# Patient Record
Sex: Male | Born: 2000
Health system: Southern US, Community
[De-identification: ages and names within clinical notes are randomized; demographics above are authoritative.]

## PROBLEM LIST (undated history)

## (undated) DIAGNOSIS — F419 Anxiety disorder, unspecified: Secondary | ICD-10-CM

## (undated) DIAGNOSIS — F32A Depression, unspecified: Secondary | ICD-10-CM

## (undated) DIAGNOSIS — F200 Paranoid schizophrenia: Secondary | ICD-10-CM

---
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
## 2018-07-11 ENCOUNTER — Ambulatory Visit (HOSPITAL_COMMUNITY)
Admission: AD | Admit: 2018-07-11 | Discharge: 2018-07-11 | Disposition: A | Discharge status: Home / Self Care | Payer: Self-pay | Attending: Psychiatry | Admitting: Psychiatry

## 2018-07-11 ENCOUNTER — Other Ambulatory Visit: Payer: Self-pay

## 2018-07-11 ENCOUNTER — Emergency Department (HOSPITAL_COMMUNITY): Payer: HRSA Program

## 2018-07-11 ENCOUNTER — Emergency Department (HOSPITAL_COMMUNITY)
Admission: EM | Admit: 2018-07-11 | Discharge: 2018-07-13 | Disposition: A | Discharge status: Other Acute Inpatient Hospital | Payer: HRSA Program | Attending: Registered Nurse | Admitting: Registered Nurse

## 2018-07-11 DIAGNOSIS — R05 Cough: Secondary | ICD-10-CM | POA: Diagnosis not present

## 2018-07-11 DIAGNOSIS — R4689 Other symptoms and signs involving appearance and behavior: Secondary | ICD-10-CM | POA: Diagnosis present

## 2018-07-11 DIAGNOSIS — Z046 Encounter for general psychiatric examination, requested by authority: Secondary | ICD-10-CM | POA: Diagnosis not present

## 2018-07-11 DIAGNOSIS — F22 Delusional disorders: Secondary | ICD-10-CM | POA: Insufficient documentation

## 2018-07-11 DIAGNOSIS — Z20828 Contact with and (suspected) exposure to other viral communicable diseases: Secondary | ICD-10-CM | POA: Insufficient documentation

## 2018-07-11 DIAGNOSIS — F69 Unspecified disorder of adult personality and behavior: Secondary | ICD-10-CM

## 2018-07-11 DIAGNOSIS — F17210 Nicotine dependence, cigarettes, uncomplicated: Secondary | ICD-10-CM | POA: Diagnosis not present

## 2018-07-11 DIAGNOSIS — Z1159 Encounter for screening for other viral diseases: Secondary | ICD-10-CM | POA: Diagnosis not present

## 2018-07-11 DIAGNOSIS — R509 Fever, unspecified: Secondary | ICD-10-CM | POA: Diagnosis not present

## 2018-07-11 LAB — CBC WITH DIFFERENTIAL/PLATELET
Abs Immature Granulocytes: 0.02 10*3/uL (ref 0.00–0.07)
Basophils Absolute: 0 10*3/uL (ref 0.0–0.1)
Basophils Relative: 0 %
Eosinophils Absolute: 0 10*3/uL (ref 0.0–0.5)
Eosinophils Relative: 0 %
HCT: 41.8 % (ref 39.0–52.0)
Hemoglobin: 14.1 g/dL (ref 13.0–17.0)
Immature Granulocytes: 0 %
Lymphocytes Relative: 27 %
Lymphs Abs: 2.1 10*3/uL (ref 0.7–4.0)
MCH: 29.1 pg (ref 26.0–34.0)
MCHC: 33.7 g/dL (ref 30.0–36.0)
MCV: 86.4 fL (ref 80.0–100.0)
Monocytes Absolute: 0.6 10*3/uL (ref 0.1–1.0)
Monocytes Relative: 7 %
Neutro Abs: 5.2 10*3/uL (ref 1.7–7.7)
Neutrophils Relative %: 66 %
Platelets: 157 10*3/uL (ref 150–400)
RBC: 4.84 MIL/uL (ref 4.22–5.81)
RDW: 13.2 % (ref 11.5–15.5)
WBC: 8 10*3/uL (ref 4.0–10.5)
nRBC: 0 % (ref 0.0–0.2)

## 2018-07-11 LAB — SARS CORONAVIRUS 2 BY RT PCR (HOSPITAL ORDER, PERFORMED IN ~~LOC~~ HOSPITAL LAB): SARS Coronavirus 2: NEGATIVE

## 2018-07-11 LAB — BASIC METABOLIC PANEL
Anion gap: 14 (ref 5–15)
BUN: 13 mg/dL (ref 6–20)
CO2: 24 mmol/L (ref 22–32)
Calcium: 9.8 mg/dL (ref 8.9–10.3)
Chloride: 101 mmol/L (ref 98–111)
Creatinine, Ser: 1.19 mg/dL (ref 0.61–1.24)
GFR calc Af Amer: >60 mL/min (ref >60)
GFR calc non Af Amer: >60 mL/min (ref >60)
Glucose, Bld: 99 mg/dL (ref 70–99)
Potassium: 3.6 mmol/L (ref 3.5–5.1)
Sodium: 139 mmol/L (ref 135–145)

## 2018-07-11 MED ORDER — ACETAMINOPHEN 325 MG PO TABS: 650.0000 mg | ORAL_TABLET | ORAL | Status: DC | PRN
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
## 2018-07-11 NOTE — ED Triage Notes (Signed)
Pt IVC and was at [facility] but is transferred here for evaluation of cough and fever. Pt tried to run while transfer was in process.

## 2018-07-11 NOTE — BHH Counselor (Signed)
Per Shatavier, NT pt is asleep, unable to rouse. Clinician asked Norm Salt, NT to call [phone]) once pt is alert and able to engage in assessment.    Redmond Pulling, MS, [facility], [facility] Triage Specialist [phone]

## 2018-07-11 NOTE — ED Provider Notes (Signed)
[facility] EMERGENCY DEPARTMENT Provider Note   CSN: [id number] Arrival date & time: 07/11/18  1711    History   Chief Complaint Chief Complaint  Patient presents with  . Cough    HPI Jeremy Taylor is a 18 y.o. male.     HPI Patient IVC at behavioral health but was transferred here after trying to run away and police had to catch him.  Behavioral health center said the patient possibly had a cough or fever but this is not documented at this time.  Patient says that he feels well and has no complaints at this time.  Police officers say they have no further history to add to the patient. No past medical history on file.  There are no active problems to display for this patient.   No past surgical history on file.      Home Medications    Prior to Admission medications   Not on File    Family History No family history on file.  Social History Social History   Tobacco Use  . Smoking status: Current Every Day Smoker    Packs/day: 1.00    Types: Cigarettes  . Smokeless tobacco: Never Used  Substance Use Topics  . Alcohol use: No  . Drug use: No     Allergies   Patient has no known allergies.   Review of Systems Review of Systems  Constitutional: Negative for activity change.  HENT: Negative for congestion.   Respiratory: Negative for chest tightness and shortness of breath.   Cardiovascular: Negative for chest pain.  Gastrointestinal: Negative for abdominal pain.  Musculoskeletal: Negative for back pain and neck pain.  Skin: Negative for rash.  Allergic/Immunologic: Negative for immunocompromised state.  Hematological: Does not bruise/bleed easily.  Psychiatric/Behavioral: Positive for behavioral problems.     Physical Exam Updated Vital Signs BP (!) 138/93 (BP Location: Right Arm)   Pulse 95   Temp 98.8 F (37.1 C) (Oral)   Resp 12   SpO2 94%   Physical Exam Vitals signs and nursing note reviewed.  Constitutional:       Appearance: He is well-developed. He is not ill-appearing.  HENT:     Head: Normocephalic and atraumatic.     Nose: No congestion or rhinorrhea.     Mouth/Throat:     Pharynx: No oropharyngeal exudate or posterior oropharyngeal erythema.  Eyes:     Conjunctiva/sclera: Conjunctivae normal.  Neck:     Musculoskeletal: Neck supple.  Cardiovascular:     Rate and Rhythm: Normal rate and regular rhythm.     Heart sounds: No murmur.  Pulmonary:     Effort: Pulmonary effort is normal. No respiratory distress.     Breath sounds: Normal breath sounds.  Abdominal:     Palpations: Abdomen is soft.     Tenderness: There is no abdominal tenderness.  Skin:    General: Skin is warm and dry.  Neurological:     General: No focal deficit present.     Mental Status: He is alert. Mental status is at baseline.     Cranial Nerves: No cranial nerve deficit.     Sensory: No sensory deficit.     Motor: No weakness.      ED Treatments / Results  Labs (all labs ordered are listed, but only abnormal results are displayed) Labs Reviewed  SARS CORONAVIRUS 2 (HOSPITAL ORDER, PERFORMED IN [facility])  CBC WITH DIFFERENTIAL/PLATELET  BASIC METABOLIC PANEL    EKG  None  Radiology Dg Chest Portable 1 View  Result Date: 07/11/2018 CLINICAL DATA:  Cough and fever EXAM: PORTABLE CHEST 1 VIEW COMPARISON:  Portable exam 7055 hours without priors for comparison FINDINGS: Normal heart size, mediastinal contours, and pulmonary vascularity. Lungs clear. No infiltrate, pleural effusion or pneumothorax. Bones unremarkable. IMPRESSION: No acute abnormalities. Electronically Signed   By: Ulyses SouthwardMark  Boles M.D.   On: 07/11/2018 18:11    Procedures Procedures (including critical care time)  Medications Ordered in ED Medications - No data to display   Initial Impression / Assessment and Plan / ED Course  I have reviewed the triage vital signs and the nursing notes.  Pertinent labs & imaging results  that were available during my care of the patient were reviewed by me and considered in my medical decision making (see chart for details).         Ill-appearing hemodynamically stable 18 year old man presents in police custody with IVC paperwork for behavioral outburst and running away.  Concern from facility that the patient may have a URI.  COVID testing underway and is negative.  Basic lab work unremarkable.  Patient otherwise appears well.  I do not believe we need further invasive testing at this time he is stable to go back to his facility.  However, I spoke to the facility and they stated that he never actually had an inpatient bed ready.  He needs a TTS evaluation.  IVC paperwork completed for the patient.  Taken to the behavioral holding unit. Final Clinical Impressions(s) / ED Diagnoses   Final diagnoses:  Behavior problem, adult  Behavioral change    ED Discharge Orders    None       Ina KickWestphal, Verlena Marlette, MD 07/11/18 2027    Gerhard MunchLockwood, Robert, MD 07/13/18 [phone]

## 2018-07-12 MED ORDER — LORAZEPAM 1 MG PO TABS
1.0000 mg | ORAL_TABLET | Freq: Four times a day (QID) | ORAL | Status: DC | PRN
  Administered 2018-07-12: 1 mg via ORAL
  Filled 2018-07-12: qty 1

## 2018-07-12 NOTE — ED Notes (Signed)
Asleep since receiving Ativan to help him relax and sleep.

## 2018-07-12 NOTE — ED Notes (Signed)
Offered him Ativan for tonight to help him relax and sleep, he is currently restless in bed. Slow to respond to questions, head covered up when initially approached. Seems like he is not understanding requests or has auditory interference due to the slowness of his responses, minimally verbal. Once he took meds covered his head back up and lying in bed quietly. Will continue to monitor for safety.

## 2018-07-12 NOTE — Progress Notes (Signed)
Pt. meets criteria for inpatient treatment per Denzil Magnuson, , NP.  No appropriate beds available at [facility]. Referred out to the following hospitals:  [facility]       [facility]     [facility]     [facility]     [facility]     [facility]     [facility]     [facility]     [facility]     [facility]     Disposition CSW will continue to follow for placement.  Timmothy Euler. Kaylyn Lim, MSW, LCSW Disposition Clinical Social Work [phone] (cell) [phone] (office)

## 2018-07-12 NOTE — B[facility] Counselor (Signed)
  [facility]  ASSESSMENT  DISPOSITION  NOTE  Disposition: [facility] discussed case with [facility] provider, Denzil Magnuson, NP who recommends inpatient treatment.  TTS will look for inpatient placement  Sammy Cassar L. Jaspal Pultz, MS, [facility], [facility] Therapeutic Triage Specialist  [phone]

## 2018-07-12 NOTE — ED Notes (Signed)
Pt continues to be in bed. Pt came out to call girlfriend, pt did not call.

## 2018-07-12 NOTE — ED Notes (Signed)
[facility] contacted about pt's breakfast tray. 

## 2018-07-12 NOTE — ED Notes (Signed)
Tech tried to get his vitals he put both of his arms in his shirt to make them unavailable for vitals and when she pressed his slightly again he said he had AIDS. Staff did not press further on vitals at this time.
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
## 2018-07-12 NOTE — ED Notes (Signed)
Pt ran off unit during report. Security notified. [employer] and security brought back to unit. RN explain IVC and had to be on unit for his safety. TTS assessed pt. Pt currently in bed.

## 2018-07-12 NOTE — ED Notes (Signed)
Lunch has been ordered  

## 2018-07-13 ENCOUNTER — Encounter (HOSPITAL_COMMUNITY): Payer: Self-pay

## 2018-07-13 ENCOUNTER — Other Ambulatory Visit: Payer: Self-pay

## 2018-07-13 ENCOUNTER — Inpatient Hospital Stay (HOSPITAL_COMMUNITY)
Admission: AD | Admit: 2018-07-13 | Discharge: 2018-07-19 | DRG: 885 | Disposition: A | Discharge status: Home / Self Care | Payer: No Typology Code available for payment source | Source: Intra-hospital | Attending: Psychiatry | Admitting: Psychiatry

## 2018-07-13 DIAGNOSIS — G4700 Insomnia, unspecified: Secondary | ICD-10-CM | POA: Diagnosis present

## 2018-07-13 DIAGNOSIS — F2081 Schizophreniform disorder: Secondary | ICD-10-CM | POA: Diagnosis present

## 2018-07-13 DIAGNOSIS — F200 Paranoid schizophrenia: Principal | ICD-10-CM

## 2018-07-13 DIAGNOSIS — F17210 Nicotine dependence, cigarettes, uncomplicated: Secondary | ICD-10-CM | POA: Diagnosis present

## 2018-07-13 DIAGNOSIS — F419 Anxiety disorder, unspecified: Secondary | ICD-10-CM | POA: Diagnosis present

## 2018-07-13 DIAGNOSIS — F1220 Cannabis dependence, uncomplicated: Secondary | ICD-10-CM | POA: Diagnosis present

## 2018-07-13 DIAGNOSIS — I10 Essential (primary) hypertension: Secondary | ICD-10-CM | POA: Diagnosis present

## 2018-07-13 DIAGNOSIS — F22 Delusional disorders: Secondary | ICD-10-CM | POA: Diagnosis present

## 2018-07-13 MED ORDER — NICOTINE POLACRILEX 2 MG MT GUM
2.0000 mg | CHEWING_GUM | OROMUCOSAL | Status: DC | PRN
  Administered 2018-07-15: 16:00:00 2 mg via ORAL
  Filled 2018-07-13: qty 1

## 2018-07-13 NOTE — Tx Team (Signed)
Initial Treatment Plan 07/13/2018 11:33 PM [name] [name] DUK:[id number]    PATIENT STRESSORS: Financial difficulties Marital or family conflict Medication change or noncompliance Occupational concerns Substance abuse   PATIENT STRENGTHS: Physical Health Supportive family/friends   PATIENT IDENTIFIED PROBLEMS: Anxiety  Substance abuse  "Be on medications."  "Place to go."               DISCHARGE CRITERIA:  Ability to meet basic life and health needs Adequate post-discharge living arrangements Improved stabilization in mood, thinking, and/or behavior Medical problems require only outpatient monitoring Motivation to continue treatment in a less acute level of care  PRELIMINARY DISCHARGE PLAN: Attend aftercare/continuing care group Attend PHP/IOP Outpatient therapy Placement in alternative living arrangements  PATIENT/FAMILY INVOLVEMENT: This treatment plan has been presented to and reviewed with the patient, Jeremy Taylor, and/or family member.  The patient and family have been given the opportunity to ask questions and make suggestions.  Bethann Punches, RN 07/13/2018, 11:33 PM

## 2018-07-13 NOTE — ED Notes (Signed)
Breakfast tray ordered 

## 2018-07-13 NOTE — ED Notes (Signed)
Pt told sitter he had to use the bathroom, sitter walked to the bathroom with him but pt took off running towards the exit. Pt went through back bowling alley hallway. [employer] left purple and [employer]pd from front got him and brought him back.

## 2018-07-13 NOTE — ED Notes (Signed)
Dinner tray at beside

## 2018-07-13 NOTE — BHH Counselor (Signed)
TTS reassessment: Patient is alert and oriented x 3. He states he does not remember why he came into the hospital. Patient denies SI/AVH but admits to passive homicidal thoughts not directed at a particular person. This clinician utilized chart review to explain to patient how he arrived to the ED. Patient does remember making suicidal statements and admitted to hearing voices telling him to hurt others and animals. Patient is pleasant and cooperative during assessment, however appears to be responding to internal stimuli. Patient continues to meet in patient criteria.

## 2018-07-13 NOTE — ED Notes (Signed)
Dinner tray ordered.

## 2018-07-13 NOTE — ED Notes (Signed)
Carney Bern from [facility] contacted this RN to advised that patient has a bed at [facility] (505-1) and will be accepted by Denzil Magnuson, NP/attending Malvin Johns, MD and patient may come over at 2000 this evening.

## 2018-07-13 NOTE — ED Notes (Signed)
Patient updated on plan of care and made aware that [facility] is working on getting him placed a facility. Pt verbalized understanding.

## 2018-07-13 NOTE — Progress Notes (Signed)
Jeremy Taylor is a 18 y.o. male involuntary admitted for homicidal and suicidal ideations.  Pt alert and cooperative during admission process. Pt answered all the question without any problems, but was not able to remember how he got to the hospital or what problem that brought him to the hospital. Pt stated his goal is to put on medications. Denied HI/SI,AVH at this time. Basic search of patient completed with skin check completed.  Belongings reviewed and noted on belongings record.  Oriented to unit and rules, consents and treatment agreement reviewed with patient and signed. Will continue to monitor.

## 2018-07-13 NOTE — ED Notes (Signed)
Lunch tray here 

## 2018-07-13 NOTE — ED Notes (Signed)
Pt has left [facility] with [employer] to go to [facility]

## 2018-07-13 NOTE — ED Notes (Signed)
Lunch tray ordered 

## 2018-07-13 NOTE — Progress Notes (Signed)
Pt accepted to [facility]  Denzil Magnuson, NP is the accepting provider.  Malvin Johns, MD is the attending provider.  Call report to [phone]  [facility] ED notified.   Pt is IVC   Pt may be transported by [employer] Pt scheduled  to arrive at [facility] @ 20:00   Carney Bern T. Kaylyn Lim, MSW, LCSW Disposition Clinical Social Work [phone] (cell) [phone] (office)

## 2018-07-13 NOTE — ED Notes (Signed)
Patient's mother (emergency contact) called stating she was concerned that she had not been able to see her son or speak with him and did not know what was going on. This RN updated pts mother on plan of care and asked patient if he would like to speak with her for a minute while she was on the phone. Patient calmly talking to mother at this time.

## 2018-07-13 NOTE — ED Notes (Signed)
Pt informed that pt has a bed at [facility] and that transport will occur soon pt was given the opportunity to call someone to inform them of his transfer and pt refused.

## 2018-07-13 NOTE — ED Notes (Signed)
Patient's mother also wanted to leave patient's father (gregory Ocallaghan's) contact number in case she cannot be reached: [phone]

## 2018-07-13 NOTE — ED Notes (Signed)
TTS machine set up at bedside per [facility] request

## 2018-07-13 NOTE — ED Notes (Signed)
Breakfast tray here.

## 2018-07-13 NOTE — ED Notes (Signed)
Pt given snack and water

## 2018-07-14 DIAGNOSIS — F200 Paranoid schizophrenia: Principal | ICD-10-CM

## 2018-07-14 MED ORDER — QUETIAPINE FUMARATE 100 MG PO TABS
100.0000 mg | ORAL_TABLET | Freq: Every day | ORAL | Status: DC
  Administered 2018-07-14: 02:00:00 100 mg via ORAL
  Filled 2018-07-14 (×4): qty 1

## 2018-07-14 MED ORDER — ADULT MULTIVITAMIN W/MINERALS CH
1.0000 | ORAL_TABLET | Freq: Every day | ORAL | Status: DC
  Administered 2018-07-14 – 2018-07-19 (×6): 1 via ORAL
  Filled 2018-07-14 (×2): qty 1
  Filled 2018-07-14: qty 7
  Filled 2018-07-14 (×6): qty 1

## 2018-07-14 MED ORDER — HYDROXYZINE HCL 50 MG PO TABS
50.0000 mg | ORAL_TABLET | Freq: Four times a day (QID) | ORAL | Status: DC | PRN
  Administered 2018-07-14: 13:00:00 50 mg via ORAL
  Filled 2018-07-14 (×2): qty 1

## 2018-07-14 MED ORDER — TEMAZEPAM 30 MG PO CAPS
30.0000 mg | ORAL_CAPSULE | Freq: Every day | ORAL | Status: DC
  Administered 2018-07-15 – 2018-07-18 (×4): 30 mg via ORAL
  Filled 2018-07-14 (×2): qty 1

## 2018-07-14 MED ORDER — LORAZEPAM 1 MG PO TABS
1.0000 mg | ORAL_TABLET | ORAL | Status: AC | PRN
  Administered 2018-07-14: 1 mg via ORAL
  Filled 2018-07-14: qty 1

## 2018-07-14 MED ORDER — ENSURE ENLIVE PO LIQD
237.0000 mL | ORAL | Status: DC
  Administered 2018-07-15 – 2018-07-17 (×2): 237 mL via ORAL

## 2018-07-14 MED ORDER — ZIPRASIDONE MESYLATE 20 MG IM SOLR: 20.0000 mg | INTRAMUSCULAR | Status: DC | PRN

## 2018-07-14 MED ORDER — LORAZEPAM 1 MG PO TABS: 2.0000 mg | ORAL_TABLET | ORAL | Status: DC | PRN

## 2018-07-14 MED ORDER — OLANZAPINE 10 MG PO TBDP
10.0000 mg | ORAL_TABLET | Freq: Three times a day (TID) | ORAL | Status: DC | PRN
  Administered 2018-07-14: 13:00:00 10 mg via ORAL
  Filled 2018-07-14: qty 1

## 2018-07-14 MED ORDER — RISPERIDONE 3 MG PO TABS
3.0000 mg | ORAL_TABLET | Freq: Two times a day (BID) | ORAL | Status: DC
  Administered 2018-07-14 – 2018-07-15 (×2): 3 mg via ORAL
  Filled 2018-07-14 (×4): qty 1

## 2018-07-14 MED ORDER — OMEGA-3-ACID ETHYL ESTERS 1 G PO CAPS
1.0000 g | ORAL_CAPSULE | Freq: Two times a day (BID) | ORAL | Status: DC
  Administered 2018-07-14 – 2018-07-19 (×10): 1 g via ORAL
  Filled 2018-07-14 (×2): qty 1
  Filled 2018-07-14: qty 14
  Filled 2018-07-14 (×4): qty 1
  Filled 2018-07-14: qty 14
  Filled 2018-07-14 (×6): qty 1

## 2018-07-14 MED ORDER — LORAZEPAM 2 MG/ML IJ SOLN: 2.0000 mg | INTRAMUSCULAR | Status: DC | PRN

## 2018-07-14 MED ORDER — BENZTROPINE MESYLATE 1 MG PO TABS
1.0000 mg | ORAL_TABLET | Freq: Two times a day (BID) | ORAL | Status: DC
  Administered 2018-07-14 – 2018-07-19 (×10): 1 mg via ORAL
  Filled 2018-07-14: qty 1
  Filled 2018-07-14: qty 14
  Filled 2018-07-14 (×2): qty 1
  Filled 2018-07-14: qty 14
  Filled 2018-07-14 (×9): qty 1

## 2018-07-14 NOTE — Progress Notes (Signed)
Recreation Therapy Notes  Date: 5.6.20 Time: 1000 Location: 500 Hall Dayroom   Group Topic: Communication, Team Building, Problem Solving  Goal Area(s) Addresses:  Patient will effectively work with peer towards shared goal.  Patient will identify skill used to make activity successful.  Patient will identify how skills used during activity can be used to reach post d/c goals.   Intervention: STEM Activity   Activity: [employer]. Patients were provided the following materials: 5 drinking straws, 5 rubber bands, 5 paper clips, 2 index cards, 2 drinking cups, and 2 toilet paper rolls. Using the provided materials patients were asked to build a launching mechanisms to launch a ping pong ball approximately 12 feet. Patients were divided into teams of 3-5.   Education: Pharmacist, community, Building control surveyor.   Education Outcome: Acknowledges education/In group clarification offered/Needs additional education.   Clinical Observations/Feedback: Pt did not attend group.    Caroll Rancher, LRT/CTRS         Caroll Rancher A 07/14/2018 12:07 PM

## 2018-07-14 NOTE — Progress Notes (Signed)
NUTRITION ASSESSMENT  Pt identified as at risk on the Malnutrition Screen Tool  INTERVENTION: - will order Ensure Enlive once/day, each supplement provides 350 kcal and 20 grams of protein. - will order daily multivitamin with minerals. - continue to encourage PO intakes.    NUTRITION DIAGNOSIS: Unintentional weight loss related to sub-optimal intake as evidenced by pt report.   Goal: Pt to meet >/= 90% of their estimated nutrition needs.  Monitor:  PO intake  Assessment:  Patient admitted for SI and HI. Per chart review, current weight is 130 lb and the last most recent weight was on 07/02/16 when he weighed 140 lb. This indicates 10 lb weight loss (7% body weight) in the past 2 years; not significant for time frame but unsure if weight loss has occurred more acutely.     18 y.o. male  Height: Ht Readings from Last 1 Encounters:  07/13/18 5\' 9"  (1.753 m)    Weight: Wt Readings from Last 1 Encounters:  07/13/18 59 kg    Weight Hx: Wt Readings from Last 10 Encounters:  07/13/18 59 kg  07/02/16 63.5 kg (35 %, Z= -0.39)*  03/09/15 62.8 kg (46 %, Z= -0.11)*   * Growth percentiles are based on CDC (Boys, 2-20 Years) data.    BMI:  Body mass index is 19.2 kg/m. Pt meets criteria for normal weight based on current BMI.  Estimated Nutritional Needs: Kcal: 25-30 kcal/kg Protein: > 1 gram protein/kg Fluid: 1 ml/kcal  Diet Order:  Diet Order            Diet regular Room service appropriate? Yes; Fluid consistency: Thin  Diet effective now             Pt is also offered choice of unit snacks mid-morning and mid-afternoon.  Pt is eating as desired.   Lab results and medications reviewed.     Trenton Gammon, MS, RD, LDN, [facility] Inpatient Clinical Dietitian Pager # [phone] After hours/weekend pager # [phone]
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
## 2018-07-14 NOTE — Progress Notes (Signed)
Psychoeducational Group Note  Date:  07/14/2018 Time:  2112  Group Topic/Focus:  Wrap-Up Group:   The focus of this group is to help patients review their daily goal of treatment and discuss progress on daily workbooks.  Participation Level: Did Not Attend  Participation Quality:  Not Applicable  Affect:  Not Applicable  Cognitive:  Not Applicable  Insight:  Not Applicable  Engagement in Group: Not Applicable  Additional Comments:  The patient did not attend group this evening.   Hazle Coca S 07/14/2018, 9:12 PM

## 2018-07-14 NOTE — Progress Notes (Signed)
[place] NOVEL CORONAVIRUS (COVID-19) DAILY CHECK-OFF SYMPTOMS - answer yes or no to each - every day NO YES  Have you had a fever in the past 24 hours?  . Fever (Temp > 37.80C / 100F) X   Have you had any of these symptoms in the past 24 hours? . New Cough .  Sore Throat  .  Shortness of Breath .  Difficulty Breathing .  Unexplained Body Aches   X   Have you had any one of these symptoms in the past 24 hours not related to allergies?   . Runny Nose .  Nasal Congestion .  Sneezing   X   If you have had runny nose, nasal congestion, sneezing in the past 24 hours, has it worsened?  X   EXPOSURES - check yes or no X   Have you traveled outside the state in the past 14 days?  X   Have you been in contact with someone with a confirmed diagnosis of COVID-19 or PUI in the past 14 days without wearing appropriate PPE?  X   Have you been living in the same home as a person with confirmed diagnosis of COVID-19 or a PUI (household contact)?    X   Have you been diagnosed with COVID-19?    X              What to do next: Answered NO to all: Answered YES to anything:   Proceed with unit schedule Follow the [facility] Inpatient Flowsheet.   

## 2018-07-14 NOTE — Progress Notes (Addendum)
Patient ID: Jeremy Taylor, male   DOB: [birth year], 18 y.o.   MRN: [id number]  [place] NOVEL CORONAVIRUS (COVID-19) DAILY CHECK-OFF SYMPTOMS - answer yes or no to each - every day NO YES  Have you had a fever in the past 24 hours?  . Fever (Temp > 37.80C / 100F) X   Have you had any of these symptoms in the past 24 hours? . New Cough .  Sore Throat  .  Shortness of Breath .  Difficulty Breathing .  Unexplained Body Aches   X   Have you had any one of these symptoms in the past 24 hours not related to allergies?   . Runny Nose .  Nasal Congestion .  Sneezing   X   If you have had runny nose, nasal congestion, sneezing in the past 24 hours, has it worsened?  X   EXPOSURES - check yes or no X   Have you traveled outside the state in the past 14 days?  X   Have you been in contact with someone with a confirmed diagnosis of COVID-19 or PUI in the past 14 days without wearing appropriate PPE?  X   Have you been living in the same home as a person with confirmed diagnosis of COVID-19 or a PUI (household contact)?    X   Have you been diagnosed with COVID-19?    X              What to do next: Answered NO to all: Answered YES to anything:   Proceed with unit schedule Follow the [facility] Inpatient Flowsheet.

## 2018-07-14 NOTE — Plan of Care (Signed)
  Problem: Medication: Goal: Compliance with prescribed medication regimen will improve Outcome: Progressing   D: Pt alert and oriented on the unit. Pt is isolative to his room and slept during the latter half of the day. Pt denies SI/HI, A/VH, and denies any pain. Pt did not participate during unit groups and activities.  A: Education, support and encouragement provided, q15 minute safety checks remain in effect. Medications administered per MD orders. R: No reactions/side effects to medicine noted. Pt ambulating on the unit with no issues. Pt remains safe on the unit.

## 2018-07-14 NOTE — [facility] Suicide Risk Assessment (Signed)
[facility] Admission Suicide Risk Assessment   Nursing information obtained from:  Patient Demographic factors:  Male Current Mental Status:  Suicidal ideation indicated by patient Loss Factors:  Financial problems / change in socioeconomic status Historical Factors:  Prior suicide attempts Risk Reduction Factors:  Living with another person, especially a relative  Total Time spent with patient: 45 minutes Principal Problem: Psychotic disorder Diagnosis:  Active Problems:   Delusional disorder (HCC)   Paranoid schizophrenia (HCC)  Subjective Data: mutism compromising interview but see mental status exam  Continued Clinical Symptoms:  Alcohol Use Disorder Identification Test Final Score (AUDIT): 0 The "Alcohol Use Disorders Identification Test", Guidelines for Use in Primary Care, Second Edition.  World Science writer [facility]). Score between 0-7:  no or low risk or alcohol related problems. Score between 8-15:  moderate risk of alcohol related problems. Score between 16-19:  high risk of alcohol related problems. Score 20 or above:  warrants further diagnostic evaluation for alcohol dependence and treatment.   CLINICAL FACTORS:   Schizophrenia:   Command hallucinatons   COGNITIVE FEATURES THAT CONTRIBUTE TO RISK:  Loss of executive function    SUICIDE RISK:   Minimal: No identifiable suicidal ideation.  Patients presenting with no risk factors but with morbid ruminations; may be classified as minimal risk based on the severity of the depressive symptoms  PLAN OF CARE: meds adjusted  I certify that inpatient services furnished can reasonably be expected to improve the patient's condition.   Malvin Johns, MD 07/14/2018, 1:13 PM

## 2018-07-14 NOTE — Tx Team (Addendum)
Interdisciplinary Treatment and Diagnostic Plan Update  07/15/2018 Time of Session: 0916 Jeremy Taylor MRN: [id number]  Principal Diagnosis: <principal problem not specified>  Secondary Diagnoses: Active Problems:   Delusional disorder ([place])   Paranoid schizophrenia ([place])   Current Medications:  Current Facility-Administered Medications  Medication Dose Route Frequency Provider Last Rate Last Dose  . benztropine (COGENTIN) tablet 1 mg  1 mg Oral BID Johnn Hai, MD   1 mg at 07/14/18 1751  . feeding supplement (ENSURE ENLIVE) (ENSURE ENLIVE) liquid 237 mL  237 mL Oral Q24H Johnn Hai, MD      . hydrOXYzine (ATARAX/VISTARIL) tablet 50 mg  50 mg Oral Q6H PRN Patriciaann Clan E, PA-C   50 mg at 07/14/18 1247  . LORazepam (ATIVAN) tablet 2 mg  2 mg Oral Q4H PRN Johnn Hai, MD       Or  . LORazepam (ATIVAN) injection 2 mg  2 mg Intramuscular Q4H PRN Johnn Hai, MD      . multivitamin with minerals tablet 1 tablet  1 tablet Oral Daily Johnn Hai, MD   1 tablet at 07/14/18 1751  . nicotine polacrilex (NICORETTE) gum 2 mg  2 mg Oral PRN Laverle Hobby, PA-C      . OLANZapine zydis (ZYPREXA) disintegrating tablet 10 mg  10 mg Oral Q8H PRN Laverle Hobby, PA-C   10 mg at 07/14/18 1247   And  . ziprasidone (GEODON) injection 20 mg  20 mg Intramuscular PRN Patriciaann Clan E, PA-C      . omega-3 acid ethyl esters (LOVAZA) capsule 1 g  1 g Oral BID Johnn Hai, MD   1 g at 07/14/18 1751  . risperiDONE (RISPERDAL) tablet 3 mg  3 mg Oral BID Johnn Hai, MD   3 mg at 07/14/18 1751  . temazepam (RESTORIL) capsule 30 mg  30 mg Oral QHS Johnn Hai, MD       PTA Medications: No medications prior to admission.    Patient Stressors: Financial difficulties Marital or family conflict Medication change or noncompliance Occupational concerns Substance abuse  Patient Strengths: Physical Health Supportive family/friends  Treatment Modalities: Medication Management, Group therapy,  Case management,  1 to 1 session with clinician, Psychoeducation, Recreational therapy.   Physician Treatment Plan for Primary Diagnosis: <principal problem not specified> Long Term Goal(s): Improvement in symptoms so as ready for discharge Improvement in symptoms so as ready for discharge   Short Term Goals: Ability to verbalize feelings will improve Ability to maintain clinical measurements within normal limits will improve  Medication Management: Evaluate patient's response, side effects, and tolerance of medication regimen.  Therapeutic Interventions: 1 to 1 sessions, Unit Group sessions and Medication administration.  Evaluation of Outcomes: Not Met  Physician Treatment Plan for Secondary Diagnosis: Active Problems:   Delusional disorder ([place])   Paranoid schizophrenia ([place])  Long Term Goal(s): Improvement in symptoms so as ready for discharge Improvement in symptoms so as ready for discharge   Short Term Goals: Ability to verbalize feelings will improve Ability to maintain clinical measurements within normal limits will improve     Medication Management: Evaluate patient's response, side effects, and tolerance of medication regimen.  Therapeutic Interventions: 1 to 1 sessions, Unit Group sessions and Medication administration.  Evaluation of Outcomes: Not Met   RN Treatment Plan for Primary Diagnosis: <principal problem not specified> Long Term Goal(s): Knowledge of disease and therapeutic regimen to maintain health will improve  Short Term Goals: Ability to identify and develop effective coping behaviors  will improve and Compliance with prescribed medications will improve  Medication Management: RN will administer medications as ordered by provider, will assess and evaluate patient's response and provide education to patient for prescribed medication. RN will report any adverse and/or side effects to prescribing provider.  Therapeutic Interventions: 1 on 1 counseling  sessions, Psychoeducation, Medication administration, Evaluate responses to treatment, Monitor vital signs and CBGs as ordered, Perform/monitor CIWA, COWS, AIMS and Fall Risk screenings as ordered, Perform wound care treatments as ordered.  Evaluation of Outcomes: Not Met   LCSW Treatment Plan for Primary Diagnosis: <principal problem not specified> Long Term Goal(s): Safe transition to appropriate next level of care at discharge, Engage patient in therapeutic group addressing interpersonal concerns.  Short Term Goals: Engage patient in aftercare planning with referrals and resources, Identify triggers associated with mental health/substance abuse issues and Increase skills for wellness and recovery  Therapeutic Interventions: Assess for all discharge needs, 1 to 1 time with Social worker, Explore available resources and support systems, Assess for adequacy in community support network, Educate family and significant other(s) on suicide prevention, Complete Psychosocial Assessment, Interpersonal group therapy.  Evaluation of Outcomes: Not Met   Progress in Treatment: Attending groups: No. Participating in groups: No. Taking medication as prescribed: Yes. Toleration medication: Yes. Family/Significant other contact made: No, will contact:  when given permission Patient understands diagnosis: No. Discussing patient identified problems/goals with staff: Yes. Medical problems stabilized or resolved: Yes. Denies suicidal/homicidal ideation: Yes. Issues/concerns per patient self-inventory: No. Other: none  New problem(s) identified: No, Describe:  none  New Short Term/Long Term Goal(s):  Patient Goals:  "make a better...." (pt unable to finish his thought)  Discharge Plan or Barriers:   Reason for Continuation of Hospitalization: Delusions  Hallucinations Medication stabilization  Estimated Length of Stay: 3-5 days.  Attendees: Patient:Jeremy Taylor 07/14/2018   Physician: Dr.  Jake Samples, MD 07/14/2018   Nursing: Boyce Medici, RN 07/14/2018   RN Care Manager: 07/14/2018   Social Worker: Lurline Idol, LCSW 07/14/2018   Recreational Therapist:  07/14/2018   Other:  07/14/2018   Other:  07/14/2018   Other: 07/14/2018      Scribe for Treatment Team: Joanne Chars, [place] 07/15/2018 8:09 AM

## 2018-07-14 NOTE — H&P (Signed)
Psychiatric Admission Assessment Adult  Patient Identification: Jeremy Taylor MRN:  [id number] Date of Evaluation:  07/14/2018 Chief Complaint:  delusional Principal Diagnosis: Psychotic disorder/schizophreniform disorder/cannabis dependence Diagnosis:  Active Problems:   Delusional disorder (HCC)   Paranoid schizophrenia (HCC)  History of Present Illness:   This is the first psychiatric admission here or elsewhere for Jeremy Taylor, 18 year old single patient petition by his live-in girlfriend for psychotic symptoms, and killing of 12 pets. The couple lives together they have 2 children and the bottom line is Jeremy Taylor is a daily cannabis user on a long-term basis as is his girlfriend (spoke to her with his permission) he nodded it was okay to call her.  At any rate she is the petitioner. Patient began having auditory hallucinations at the end of February and into early March and it persisted the voices tell him to harm animals and harm people and he is threatened her as well.  Further he has been killing their pet chickens and their [place] pig, and she put a baby cam in the cooper to make sure it was him doing it.  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  He refused to talk for prolonged period of time.  Later on in the morning he did agree to an interview  So he is alert he is oriented to person presumably situation but he was mute for prolonged periods of time when explained to him that I am the doctor and if he wants to leave the hospital he must talk to me at some point he began to give me very short simple answers he nodded yes when asked if he was having auditory hallucinations he described them as outside his head would not elaborate on content he nodded no when asked if he want to harm himself or anyone else.  Again very little information otherwise.  He denied drug use but his girlfriend acknowledged  daily cannabis use multiple times a day for chronic period of time.  Further he did act bizarrely later in the morning and began a racing R dry erase board with patient names and so forth on it and had to be given PRN lorazepam and Zyprexa.  I did explain risk benefits side effects of meds with are lost on him due to his psychotic condition at this point in time  Associated Signs/Symptoms: Depression Symptoms:  psychomotor retardation, (Hypo) Manic Symptoms:  Hallucinations, Anxiety Symptoms:  n/a Psychotic Symptoms:  Hallucinations: Auditory PTSD Symptoms: NA Total Time spent with patient: 45 minutes  Past Psychiatric History:   Is the patient at risk to self? Yes.    Has the patient been a risk to self in the past 6 months? No.  Has the patient been a risk to self within the distant past? No.  Is the patient a risk to others? No.  Has the patient been a risk to others in the past 6 months? No.  Has the patient been a risk to others within the distant past? No.   Prior Inpatient Therapy:   Prior Outpatient Therapy:    Alcohol Screening: 1. How often do you have a drink containing alcohol?: Never 2. How many drinks containing alcohol do you have on a typical day when you are drinking?: 1 or 2 3. How often do you have six or more drinks on one occasion?: Never AUDIT-C Score: 0 4. How often during the  last year have you found that you were not able to stop drinking once you had started?: Never 5. How often during the last year have you failed to do what was normally expected from you becasue of drinking?: Never 6. How often during the last year have you needed a first drink in the morning to get yourself going after a heavy drinking session?: Never 7. How often during the last year have you had a feeling of guilt of remorse after drinking?: Never 8. How often during the last year have you been unable to remember what happened the night before because you had been drinking?: Never 9.  Have you or someone else been injured as a result of your drinking?: No 10. Has a relative or friend or a doctor or another health worker been concerned about your drinking or suggested you cut down?: No Alcohol Use Disorder Identification Test Final Score (AUDIT): 0 Alcohol Brief Interventions/Follow-up: AUDIT Score <7 follow-up not indicated Substance Abuse History in the last 12 months:  Yes.   Consequences of Substance Abuse: NA Previous Psychotropic Medications: No  Psychological Evaluations: No  Past Medical History: History reviewed. No pertinent past medical history. History reviewed. No pertinent surgical history. Family History: History reviewed. No pertinent family history. Family Psychiatric  History:  Tobacco Screening: Have you used any form of tobacco in the last 30 days? (Cigarettes, Smokeless Tobacco, Cigars, and/or Pipes): Yes Tobacco use, Select all that apply: 5 or more cigarettes per day Are you interested in Tobacco Cessation Medications?: Yes, will notify MD for an order Counseled patient on smoking cessation including recognizing danger situations, developing coping skills and basic information about quitting provided: Yes Social History:  Social History   Substance and Sexual Activity  Alcohol Use No     Social History   Substance and Sexual Activity  Drug Use No    Additional Social History:      Pain Medications: See MARs Prescriptions: See MARs Over the Counter: See MARs History of alcohol / drug use?: Yes Negative Consequences of Use: Personal relationships Name of Substance 1: Alcohol 1 - Age of First Use: unknown                  Allergies:  No Known Allergies Lab Results: No results found for this or any previous visit (from the past 48 hour(s)).  Blood Alcohol level:  Lab Results  Component Value Date   ETH 59 (H) 03/25/2013    Metabolic Disorder Labs:  No results found for: HGBA1C, MPG No results found for: PROLACTIN No  results found for: CHOL, TRIG, HDL, CHOLHDL, VLDL, LDLCALC  Current Medications: Current Facility-Administered Medications  Medication Dose Route Frequency Provider Last Rate Last Dose  . benztropine (COGENTIN) tablet 1 mg  1 mg Oral BID Malvin Johns, MD      . feeding supplement (ENSURE ENLIVE) (ENSURE ENLIVE) liquid 237 mL  237 mL Oral Q24H Malvin Johns, MD      . hydrOXYzine (ATARAX/VISTARIL) tablet 50 mg  50 mg Oral Q6H PRN Donell Sievert E, PA-C   50 mg at 07/14/18 1247  . LORazepam (ATIVAN) tablet 2 mg  2 mg Oral Q4H PRN Malvin Johns, MD       Or  . LORazepam (ATIVAN) injection 2 mg  2 mg Intramuscular Q4H PRN Malvin Johns, MD      . multivitamin with minerals tablet 1 tablet  1 tablet Oral Daily Malvin Johns, MD      . nicotine polacrilex (  NICORETTE) gum 2 mg  2 mg Oral PRN Kerry Hough, PA-C      . OLANZapine zydis (ZYPREXA) disintegrating tablet 10 mg  10 mg Oral Q8H PRN Kerry Hough, PA-C   10 mg at 07/14/18 1247   And  . ziprasidone (GEODON) injection 20 mg  20 mg Intramuscular PRN Kerry Hough, PA-C      . omega-3 acid ethyl esters (LOVAZA) capsule 1 g  1 g Oral BID Malvin Johns, MD      . risperiDONE (RISPERDAL) tablet 3 mg  3 mg Oral BID Malvin Johns, MD      . temazepam (RESTORIL) capsule 30 mg  30 mg Oral QHS Malvin Johns, MD       PTA Medications: No medications prior to admission.    Musculoskeletal: Strength & Muscle Tone: within normal limits Gait & Station: normal Patient leans: N/A  Psychiatric Specialty Exam: Physical Exam vss  ROS neurological review of systems, negative for head injury or seizures, GI negative for issues, cardiovascular negative for issues no chest pain no palpitations  Blood pressure (!) 127/56, pulse (!) 53, temperature 99 F (37.2 C), temperature source Oral, resp. rate 18, height  (1.753 m), weight 59 kg, SpO2 100 %.Body mass index is 19.2 kg/m.  General Appearance: Disheveled  Eye Contact:  Minimal  Speech:  With  majority of our time give short answers after prolonged period of time  Volume:  Decreased  Mood:  Dysphoric and Apathetic  Affect:  Blunt and Congruent  Thought Process:  Disorganized and Descriptions of Associations: Circumstantial  Orientation:  Other:  Would not answer basic questions of orientation  Thought Content:  Illogical and Hallucinations: Auditory  Suicidal Thoughts:  No  Homicidal Thoughts:  No  Memory:  Immediate;   Poor  Judgement:  Impaired  Insight:  Lacking  Psychomotor Activity:  Decreased  Concentration:  Concentration: Poor  Recall:  Poor  Fund of Knowledge:  Poor  Language:  Poor  Akathisia:  Negative  Handed:  Right  AIMS (if indicated):     Assets:  Leisure Time Physical Health  ADL's:  Intact  Cognition:  WNL  Sleep:  Number of Hours: 3  Again best we can tell poverty of content of speech and thought, versus thought blocking, mutism for about half the interview, finally did begin speaking only minimally  Treatment Plan Summary: Daily contact with patient to assess and evaluate symptoms and progress in treatment and Medication management  Observation Level/Precautions:  15 minute checks  Laboratory:  UDS  Psychotherapy: Reality based  Medications: Begin Risperdal  Consultations: Not necessary  Discharge Concerns: Long-term stability and compliance  Estimated LOS:6-10  Other: Abstinence from cannabis is a must  Axis I schizophreniform disorder/probably cannabis induced/cannabis dependence Axis II deferred Axis III very healthy on exam   Physician Treatment Plan for Primary Diagnosis: <principal problem not specified> Long Term Goal(s): Improvement in symptoms so as ready for discharge  Short Term Goals: Ability to verbalize feelings will improve  Physician Treatment Plan for Secondary Diagnosis: Active Problems:   Delusional disorder (HCC)   Paranoid schizophrenia (HCC)  Long Term Goal(s): Improvement in symptoms so as ready for  discharge  Short Term Goals: Ability to maintain clinical measurements within normal limits will improve  I certify that inpatient services furnished can reasonably be expected to improve the patient's condition.    Malvin Johns, MD 5/6/20201:14 PM

## 2018-07-14 NOTE — Progress Notes (Signed)
Pt currently asleep in bed. Respiration are even and unlabored. Pt in no sign of distress. Will continue to monitor.   

## 2018-07-15 MED ORDER — RISPERIDONE 3 MG PO TABS
6.0000 mg | ORAL_TABLET | Freq: Every day | ORAL | Status: DC
  Administered 2018-07-15 – 2018-07-18 (×4): 6 mg via ORAL
  Filled 2018-07-15 (×6): qty 2

## 2018-07-15 MED ORDER — ARIPIPRAZOLE 2 MG PO TABS
2.0000 mg | ORAL_TABLET | Freq: Once | ORAL | Status: AC
  Administered 2018-07-15: 14:00:00 2 mg via ORAL
  Filled 2018-07-15: qty 1

## 2018-07-15 MED ORDER — RISPERIDONE 3 MG PO TABS
3.0000 mg | ORAL_TABLET | Freq: Every day | ORAL | Status: DC
  Administered 2018-07-16 – 2018-07-19 (×4): 3 mg via ORAL
  Filled 2018-07-15 (×4): qty 1
  Filled 2018-07-15: qty 21
  Filled 2018-07-15: qty 1

## 2018-07-15 NOTE — [facility] Group Notes (Signed)
Pt was invited but did not attend orientation/goals group. 

## 2018-07-15 NOTE — [facility] Suicide Risk Assessment (Signed)
[facility] INPATIENT:  Family/Significant Other Suicide Prevention Education  Suicide Prevention Education:  Contact Attempts: Adele Dan, girlfriend, [phone], has been identified by the patient as the family member/significant other with whom the patient will be residing, and identified as the person(s) who will aid the patient in the event of a mental health crisis.  With written consent from the patient, two attempts were made to provide suicide prevention education, prior to and/or following the patient's discharge.  We were unsuccessful in providing suicide prevention education.  A suicide education pamphlet was given to the patient to share with family/significant other.  Date and time of first attempt:07/15/18, 36 Date and time of second attempt:  Lorri Frederick 07/15/2018, 12:32 PM

## 2018-07-15 NOTE — Plan of Care (Signed)
  Problem: Education: Goal: Emotional status will improve Outcome: Progressing Goal: Mental status will improve Outcome: Progressing   D: Pt alert and oriented on the unit. Pt denies SI/HI, A/VH. Pt slept most of the day and sat in the day room for a few minutes before returning to his room. Pt participated during unit activities. Pt is pleasant and cooperative. A: Education, support and encouragement provided, q15 minute safety checks remain in effect. Medications administered per MD orders. R: No reactions/side effects to medicine noted. Pt denies any concerns at this time, and verbally contracts for safety. Pt ambulating on the unit with no issues. Pt remains safe on and off the unit.

## 2018-07-15 NOTE — [facility] Group Notes (Signed)
[facility] LCSW Group Therapy Note  Date/Time: 07/15/18, 1315  Type of Therapy/Topic:  Group Therapy:  Balance in Life  Participation Level:  Did not attend  Description of Group:    This group will address the concept of balance and how it feels and looks when one is unbalanced. Patients will be encouraged to process areas in their lives that are out of balance, and identify reasons for remaining unbalanced. Facilitators will guide patients utilizing problem- solving interventions to address and correct the stressor making their life unbalanced. Understanding and applying boundaries will be explored and addressed for obtaining  and maintaining a balanced life. Patients will be encouraged to explore ways to assertively make their unbalanced needs known to significant others in their lives, using other group members and facilitator for support and feedback.  Therapeutic Goals: 1. Patient will identify two or more emotions or situations they have that consume much of in their lives. 2. Patient will identify signs/triggers that life has become out of balance:  3. Patient will identify two ways to set boundaries in order to achieve balance in their lives:  4. Patient will demonstrate ability to communicate their needs through discussion and/or role plays  Summary of Patient Progress:          Therapeutic Modalities:   Cognitive Behavioral Therapy Solution-Focused Therapy Assertiveness Training  Greg Inis Borneman, LCSW 
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
## 2018-07-15 NOTE — Progress Notes (Signed)
Patient ID: Jeremy Taylor, male   DOB: 08/08/2000, 18 y.o.   MRN: [id number]  [place] NOVEL CORONAVIRUS (COVID-19) DAILY CHECK-OFF SYMPTOMS - answer yes or no to each - every day NO YES  Have you had a fever in the past 24 hours?  . Fever (Temp > 37.80C / 100F) X   Have you had any of these symptoms in the past 24 hours? . New Cough .  Sore Throat  .  Shortness of Breath .  Difficulty Breathing .  Unexplained Body Aches   X   Have you had any one of these symptoms in the past 24 hours not related to allergies?   . Runny Nose .  Nasal Congestion .  Sneezing   X   If you have had runny nose, nasal congestion, sneezing in the past 24 hours, has it worsened?  X   EXPOSURES - check yes or no X   Have you traveled outside the state in the past 14 days?  X   Have you been in contact with someone with a confirmed diagnosis of COVID-19 or PUI in the past 14 days without wearing appropriate PPE?  X   Have you been living in the same home as a person with confirmed diagnosis of COVID-19 or a PUI (household contact)?    X   Have you been diagnosed with COVID-19?    X              What to do next: Answered NO to all: Answered YES to anything:   Proceed with unit schedule Follow the [facility] Inpatient Flowsheet.    

## 2018-07-15 NOTE — Progress Notes (Signed)
Nursing Progress Note: 7p-7a D: Pt currently presents with a depressed/anxious/minimal/circumstantial/isolative affect and behavior. Pt states only affirmatives. Interacting appropriately with the milieu. Pt reports good sleep during the previous night with current medication regimen. Pt did attend wrap-up group.  A: Pt provided with medications per providers orders. Pt's labs and vitals were monitored throughout the night. Pt supported emotionally and encouraged to express concerns and questions. Pt educated on medications.  R: Pt's safety ensured with 15 minute and environmental checks. Pt currently denies SI, HI, and AVH. Pt verbally contracts to seek staff if SI,HI, or AVH occurs and to consult with staff before acting on any harmful thoughts. Will continue to monitor.

## 2018-07-15 NOTE — Progress Notes (Signed)
[facility] MD Progress Note  07/15/2018 9:29 AM Jeremy Taylor  MRN:  [id number] Subjective:    Patient seen continues to endorse auditory hallucinations described as the voices of his mother but he will not elaborate on content.  Denies visual hallucinations.  He denies wanting to harm himself.  He is very guarded and mute for prolonged stretches of the interview. Mood is generally stable but intense and affect. Speech minimal.  Poverty of content likely. No EPS or TD  Principal Problem: Schizophreniform disorder/cannabis dependency Diagnosis: Active Problems:   Delusional disorder (HCC)   Paranoid schizophrenia (HCC)  Total Time spent with patient: 20 minutes  Past Medical History: History reviewed. No pertinent past medical history. History reviewed. No pertinent surgical history. Family History: History reviewed. No pertinent family history. Family Psychiatric  History: Extensive according to the team recollection of his adolescent admissions but he denies Social History:  Social History   Substance and Sexual Activity  Alcohol Use No     Social History   Substance and Sexual Activity  Drug Use No    Social History   Socioeconomic History  . Marital status: Single    Spouse name: Not on file  . Number of children: Not on file  . Years of education: Not on file  . Highest education level: Not on file  Occupational History  . Not on file  Social Needs  . Financial resource strain: Not on file  . Food insecurity:    Worry: Not on file    Inability: Not on file  . Transportation needs:    Medical: Not on file    Non-medical: Not on file  Tobacco Use  . Smoking status: Current Every Day Smoker    Packs/day: 1.00    Types: Cigarettes  . Smokeless tobacco: Never Used  Substance and Sexual Activity  . Alcohol use: No  . Drug use: No  . Sexual activity: Not on file  Lifestyle  . Physical activity:    Days per week: Not on file    Minutes per session: Not on file   . Stress: Not on file  Relationships  . Social connections:    Talks on phone: Not on file    Gets together: Not on file    Attends religious service: Not on file    Active member of club or organization: Not on file    Attends meetings of clubs or organizations: Not on file    Relationship status: Not on file  Other Topics Concern  . Not on file  Social History Narrative  . Not on file   Additional Social History:    Pain Medications: See MARs Prescriptions: See MARs Over the Counter: See MARs History of alcohol / drug use?: Yes Negative Consequences of Use: Personal relationships Name of Substance 1: Alcohol 1 - Age of First Use: unknown                  Sleep: Fair  Appetite:  Fair  Current Medications: Current Facility-Administered Medications  Medication Dose Route Frequency Provider Last Rate Last Dose  . ARIPiprazole (ABILIFY) tablet 2 mg  2 mg Oral Once Malvin Johns, MD      . benztropine (COGENTIN) tablet 1 mg  1 mg Oral BID Malvin Johns, MD   1 mg at 07/15/18 0810  . feeding supplement (ENSURE ENLIVE) (ENSURE ENLIVE) liquid 237 mL  237 mL Oral Q24H Malvin Johns, MD      . hydrOXYzine (ATARAX/VISTARIL) tablet 50  mg  50 mg Oral Q6H PRN Kerry HoughSimon, Spencer E, PA-C   50 mg at 07/14/18 1247  . LORazepam (ATIVAN) tablet 2 mg  2 mg Oral Q4H PRN Malvin JohnsFarah, Keiaira Donlan, MD       Or  . LORazepam (ATIVAN) injection 2 mg  2 mg Intramuscular Q4H PRN Malvin JohnsFarah, Shaneika Rossa, MD      . multivitamin with minerals tablet 1 tablet  1 tablet Oral Daily Malvin JohnsFarah, Stoney Karczewski, MD   1 tablet at 07/15/18 0810  . nicotine polacrilex (NICORETTE) gum 2 mg  2 mg Oral PRN Kerry HoughSimon, Spencer E, PA-C      . OLANZapine zydis (ZYPREXA) disintegrating tablet 10 mg  10 mg Oral Q8H PRN Kerry HoughSimon, Spencer E, PA-C   10 mg at 07/14/18 1247   And  . ziprasidone (GEODON) injection 20 mg  20 mg Intramuscular PRN Donell SievertSimon, Spencer E, PA-C      . omega-3 acid ethyl esters (LOVAZA) capsule 1 g  1 g Oral BID Malvin JohnsFarah, Kadejah Sandiford, MD   1 g at 07/15/18  0810  . [START ON 07/16/2018] risperiDONE (RISPERDAL) tablet 3 mg  3 mg Oral Daily Malvin JohnsFarah, Lillionna Nabi, MD      . risperiDONE (RISPERDAL) tablet 6 mg  6 mg Oral QHS Malvin JohnsFarah, Akeen Ledyard, MD      . temazepam (RESTORIL) capsule 30 mg  30 mg Oral QHS Malvin JohnsFarah, Karrisa Didio, MD        Lab Results: No results found for this or any previous visit (from the past 48 hour(s)).  Blood Alcohol level:  Lab Results  Component Value Date   ETH 59 (H) 03/25/2013    Metabolic Disorder Labs: No results found for: HGBA1C, MPG No results found for: PROLACTIN No results found for: CHOL, TRIG, HDL, CHOLHDL, VLDL, LDLCALC  Physical Findings: AIMS: Facial and Oral Movements Muscles of Facial Expression: None, normal Lips and Perioral Area: None, normal Jaw: None, normal Tongue: None, normal,Extremity Movements Upper (arms, wrists, hands, fingers): None, normal Lower (legs, knees, ankles, toes): None, normal, Trunk Movements Neck, shoulders, hips: None, normal, Overall Severity Severity of abnormal movements (highest score from questions above): None, normal Incapacitation due to abnormal movements: None, normal Patient's awareness of abnormal movements (rate only patient's report): No Awareness, Dental Status Current problems with teeth and/or dentures?: No Does patient usually wear dentures?: No  CIWA:  CIWA-Ar Total: 1 COWS:  COWS Total Score: 1  Musculoskeletal: Strength & Muscle Tone: within normal limits Gait & Station: normal Patient leans: N/A  Psychiatric Specialty Exam: Physical Exam  ROS  Blood pressure 126/73, pulse (!) 57, temperature (!) 97.4 F (36.3 C), temperature source Oral, resp. rate 18, height 5\' 9"  (1.753 m), weight 59 kg, SpO2 100 %.Body mass index is 19.2 kg/m.  General Appearance: Casual  Eye Contact:  Minimal  Speech:  Slow  Volume:  Decreased  Mood:  Dysphoric  Affect:  Blunt and Congruent  Thought Process:  Disorganized and Irrelevant  Orientation:  Full (Time, Place, and Person)   Thought Content:  Poverty of content and possible delusional believes endorsing auditory hallucinations  Suicidal Thoughts:  No  Homicidal Thoughts:  No  Memory:  Immediate;   Fair  Judgement:  Impaired  Insight:  Lacking  Psychomotor Activity:  Decreased  Concentration:  Concentration: Fair  Recall:  Fair  Fund of Knowledge:  Poor  Language:  Demented mutism poverty of content reflected in speech  Akathisia:  Negative  Handed:  Right  AIMS (if indicated):     Assets:  Resilience  ADL's:  Intact  Cognition:  WNL  Sleep:  Number of Hours: 6.5     Treatment Plan Summary: Daily contact with patient to assess and evaluate symptoms and progress in treatment, Medication management and Plan Continue cognitive-based work continue reality-based therapy escalate Risperdal continue neuroprotective measures dissipate long-acting injectable  Iva Montelongo, MD 07/15/2018, 9:29 AM
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
## 2018-07-15 NOTE — Progress Notes (Signed)
[place] NOVEL CORONAVIRUS (COVID-19) DAILY CHECK-OFF SYMPTOMS - answer yes or no to each - every day NO YES  Have you had a fever in the past 24 hours?  . Fever (Temp > 37.80C / 100F) X   Have you had any of these symptoms in the past 24 hours? . New Cough .  Sore Throat  .  Shortness of Breath .  Difficulty Breathing .  Unexplained Body Aches   X   Have you had any one of these symptoms in the past 24 hours not related to allergies?   . Runny Nose .  Nasal Congestion .  Sneezing   X   If you have had runny nose, nasal congestion, sneezing in the past 24 hours, has it worsened?  X   EXPOSURES - check yes or no X   Have you traveled outside the state in the past 14 days?  X   Have you been in contact with someone with a confirmed diagnosis of COVID-19 or PUI in the past 14 days without wearing appropriate PPE?  X   Have you been living in the same home as a person with confirmed diagnosis of COVID-19 or a PUI (household contact)?    X   Have you been diagnosed with COVID-19?    X              What to do next: Answered NO to all: Answered YES to anything:   Proceed with unit schedule Follow the [facility] Inpatient Flowsheet.   

## 2018-07-16 MED ORDER — ARIPIPRAZOLE ER 400 MG IM SRER
400.0000 mg | INTRAMUSCULAR | Status: DC
  Administered 2018-07-16: 400 mg via INTRAMUSCULAR

## 2018-07-16 MED ORDER — PROPRANOLOL HCL 20 MG PO TABS
20.0000 mg | ORAL_TABLET | Freq: Two times a day (BID) | ORAL | Status: DC
  Administered 2018-07-16 – 2018-07-19 (×5): 20 mg via ORAL
  Filled 2018-07-16 (×6): qty 1
  Filled 2018-07-16 (×2): qty 14
  Filled 2018-07-16 (×3): qty 1

## 2018-07-16 NOTE — Progress Notes (Signed)
DAR NOTE: Patient presents with flat affect and depressed mood.  Denies suicidal thoughts, pain, auditory and visual hallucinations.  Described energy level as normal and concentration as good.  Rates depression at 1, hopelessness at 3, and anxiety at 1.  Maintained on routine safety checks.  Medications given as prescribed.  Abilify maintenna IM given.  No adverse reaction noted.   Support and encouragement offered as needed.  Attended group and participated.  States goal for today is "work out to get back home."  Patient visible in milieu with minimal interaction.  Offered no complaint.

## 2018-07-16 NOTE — [facility] Group Notes (Signed)
[facility] Group Notes:  (Nursing/MHT/Case Management/Adjunct)  Date:  07/15/2018  Time:  4:00 PM  Type of Therapy:  Nurse Education  Participation Level:  Minimal  Participation Quality:  Appropriate and Attentive  Affect:  Blunted and Flat  Cognitive:  Alert and Appropriate  Insight:  Appropriate  Engagement in Group:  Lacking and Limited  Modes of Intervention:  Discussion and Education  Summary of Progress/Problems: pt's discussed crisis management and outlets to pursue for future events.  Jeremy Taylor 07/16/2018, 9:55 AM

## 2018-07-16 NOTE — Progress Notes (Addendum)
Spiritual Care Group facilitated by chaplain Burnis Kingfisher, MDiv, B[facility].    Group Description:   Group focused on topic of Hope.  Patients engaged in facilitated dialog around topic, identifying definitions and examples.  Patients engaged in visual explorer exercise, connecting hope to current experience.    Patient Progress:  Jeremy Taylor was present throughout group.  Engaged in group discussion and activity when facilitator prompted. Jeremy Taylor spoke of hope as needing to be connected to reality.  Group spoke about "blind faith" versus hope that is connected to our needs and resources.

## 2018-07-16 NOTE — Progress Notes (Signed)
Nursing Progress Note: 7p-7a D: Pt currently presents with a sad/flat/empty/circumstantial affect and behavior. Pt states "I don't like the group. They are too wild for me." Interacting minimally with the milieu. Pt reports fair sleep during the previous night with current medication regimen. Pt did attend wrap-up group.  A: Pt provided with medications per providers orders. Pt's labs and vitals were monitored throughout the night. Pt supported emotionally and encouraged to express concerns and questions. Pt educated on medications.  R: Pt's safety ensured with 15 minute and environmental checks. Pt currently denies SI, HI, and AVH. Pt verbally contracts to seek staff if SI,HI, or AVH occurs and to consult with staff before acting on any harmful thoughts. Will continue to monitor.

## 2018-07-16 NOTE — Progress Notes (Signed)
Adult Psychoeducational Group Note  Date:  07/16/2018 Time:  11:02 PM  Group Topic/Focus:  Wrap-Up Group:   The focus of this group is to help patients review their daily goal of treatment and discuss progress on daily workbooks.  Participation Level:  Active  Participation Quality:  Appropriate  Affect:  Appropriate  Cognitive:  Appropriate  Insight: Appropriate  Engagement in Group:  Engaged  Modes of Intervention:  Discussion  Additional Comments:  Pt stated his goal for today was to talk with his doctor and to stay positive about his treatment. Pt stated he accomplished his goal for the day. Pt rated his over all day a 10. Pt stated he attend all groups held. Pt stated he contacted his support team, which help improve his day.  Felipa Furnace 07/16/2018, 11:02 PM

## 2018-07-16 NOTE — Progress Notes (Signed)
[place] NOVEL CORONAVIRUS (COVID-19) DAILY CHECK-OFF SYMPTOMS - answer yes or no to each - every day NO YES  Have you had a fever in the past 24 hours?  . Fever (Temp > 37.80C / 100F) X   Have you had any of these symptoms in the past 24 hours? . New Cough .  Sore Throat  .  Shortness of Breath .  Difficulty Breathing .  Unexplained Body Aches   X   Have you had any one of these symptoms in the past 24 hours not related to allergies?   . Runny Nose .  Nasal Congestion .  Sneezing   X   If you have had runny nose, nasal congestion, sneezing in the past 24 hours, has it worsened?  X   EXPOSURES - check yes or no X   Have you traveled outside the state in the past 14 days?  X   Have you been in contact with someone with a confirmed diagnosis of COVID-19 or PUI in the past 14 days without wearing appropriate PPE?  X   Have you been living in the same home as a person with confirmed diagnosis of COVID-19 or a PUI (household contact)?    X   Have you been diagnosed with COVID-19?    X              What to do next: Answered NO to all: Answered YES to anything:   Proceed with unit schedule Follow the [facility] Inpatient Flowsheet.   

## 2018-07-16 NOTE — Progress Notes (Signed)
[facility] MD Progress Note  07/16/2018 10:09 AM Jeremy Taylor  MRN:  [id number] Subjective:  Patient understands that due to his behaviors he has been expelled from his community and not quite sure where he will be exactly other than stating he will probably stay with his mother but this not definitive he denies wanting to harm self and he denies wanting to harm animals he does indeed have a resolution of auditory hallucinations if he is accurate in his responses on mental status exam, no EPS or TD, long-term compliance will probably be an issue so we will administer the long-acting injectable today Principal Problem: Schizophrenic disorder Diagnosis: Active Problems:   Delusional disorder (HCC)   Paranoid schizophrenia (HCC)  Total Time spent with patient: 20 minutes  Past Medical History: History reviewed. No pertinent past medical history. History reviewed. No pertinent surgical history. Family History: History reviewed. No pertinent family history. Family Psychiatric  History: neg Social History:  Social History   Substance and Sexual Activity  Alcohol Use No     Social History   Substance and Sexual Activity  Drug Use No    Social History   Socioeconomic History  . Marital status: Single    Spouse name: Not on file  . Number of children: Not on file  . Years of education: Not on file  . Highest education level: Not on file  Occupational History  . Not on file  Social Needs  . Financial resource strain: Not on file  . Food insecurity:    Worry: Not on file    Inability: Not on file  . Transportation needs:    Medical: Not on file    Non-medical: Not on file  Tobacco Use  . Smoking status: Current Every Day Smoker    Packs/day: 1.00    Types: Cigarettes  . Smokeless tobacco: Never Used  Substance and Sexual Activity  . Alcohol use: No  . Drug use: No  . Sexual activity: Not on file  Lifestyle  . Physical activity:    Days per week: Not on file    Minutes per  session: Not on file  . Stress: Not on file  Relationships  . Social connections:    Talks on phone: Not on file    Gets together: Not on file    Attends religious service: Not on file    Active member of club or organization: Not on file    Attends meetings of clubs or organizations: Not on file    Relationship status: Not on file  Other Topics Concern  . Not on file  Social History Narrative  . Not on file   Additional Social History:    Pain Medications: See MARs Prescriptions: See MARs Over the Counter: See MARs History of alcohol / drug use?: Yes Negative Consequences of Use: Personal relationships Name of Substance 1: Alcohol 1 - Age of First Use: unknown                  Sleep: Good  Appetite:  Good  Current Medications: Current Facility-Administered Medications  Medication Dose Route Frequency Provider Last Rate Last Dose  . ARIPiprazole ER (ABILIFY MAINTENA) injection 400 mg  400 mg Intramuscular Q28 days Malvin Johns, MD      . benztropine (COGENTIN) tablet 1 mg  1 mg Oral BID Malvin Johns, MD   1 mg at 07/16/18 4098  . feeding supplement (ENSURE ENLIVE) (ENSURE ENLIVE) liquid 237 mL  237 mL Oral Q24H Jeannine Kitten,  Arlys JohnBrian, MD   237 mL at 07/15/18 2148  . hydrOXYzine (ATARAX/VISTARIL) tablet 50 mg  50 mg Oral Q6H PRN Kerry HoughSimon, Spencer E, PA-C   50 mg at 07/14/18 1247  . LORazepam (ATIVAN) tablet 2 mg  2 mg Oral Q4H PRN Malvin JohnsFarah, Kirat Mezquita, MD       Or  . LORazepam (ATIVAN) injection 2 mg  2 mg Intramuscular Q4H PRN Malvin JohnsFarah, Latifa Noble, MD      . multivitamin with minerals tablet 1 tablet  1 tablet Oral Daily Malvin JohnsFarah, Alsie Younes, MD   1 tablet at 07/16/18 [phone]  . nicotine polacrilex (NICORETTE) gum 2 mg  2 mg Oral PRN Donell SievertSimon, Spencer E, PA-C   2 mg at 07/15/18 1601  . OLANZapine zydis (ZYPREXA) disintegrating tablet 10 mg  10 mg Oral Q8H PRN Kerry HoughSimon, Spencer E, PA-C   10 mg at 07/14/18 1247   And  . ziprasidone (GEODON) injection 20 mg  20 mg Intramuscular PRN Donell SievertSimon, Spencer E, PA-C      .  omega-3 acid ethyl esters (LOVAZA) capsule 1 g  1 g Oral BID Malvin JohnsFarah, Dhruti Ghuman, MD   1 g at 07/16/18 [id number]  . propranolol (INDERAL) tablet 20 mg  20 mg Oral BID Malvin JohnsFarah, Dejanay Wamboldt, MD      . risperiDONE (RISPERDAL) tablet 3 mg  3 mg Oral Daily Malvin JohnsFarah, Reef Achterberg, MD   3 mg at 07/16/18 [id number]  . risperiDONE (RISPERDAL) tablet 6 mg  6 mg Oral QHS Malvin JohnsFarah, Kelly Eisler, MD   6 mg at 07/15/18 2147  . temazepam (RESTORIL) capsule 30 mg  30 mg Oral QHS Malvin JohnsFarah, Destiny Trickey, MD   30 mg at 07/15/18 2147    Lab Results: No results found for this or any previous visit (from the past 48 hour(s)).  Blood Alcohol level:  Lab Results  Component Value Date   ETH 59 (H) 03/25/2013    Metabolic Disorder Labs: No results found for: HGBA1C, MPG No results found for: PROLACTIN No results found for: CHOL, TRIG, HDL, CHOLHDL, VLDL, LDLCALC  Physical Findings: AIMS: Facial and Oral Movements Muscles of Facial Expression: None, normal Lips and Perioral Area: None, normal Jaw: None, normal Tongue: None, normal,Extremity Movements Upper (arms, wrists, hands, fingers): None, normal Lower (legs, knees, ankles, toes): None, normal, Trunk Movements Neck, shoulders, hips: None, normal, Overall Severity Severity of abnormal movements (highest score from questions above): None, normal Incapacitation due to abnormal movements: None, normal Patient's awareness of abnormal movements (rate only patient's report): No Awareness, Dental Status Current problems with teeth and/or dentures?: No Does patient usually wear dentures?: No  CIWA:  CIWA-Ar Total: 1 COWS:  COWS Total Score: 1  Musculoskeletal: Strength & Muscle Tone: within normal limits Gait & Station: normal Patient leans: N/A  Psychiatric Specialty Exam: Physical Exam  ROS  Blood pressure (!) 125/104, pulse 93, temperature (!) 97.5 F (36.4 C), temperature source Oral, resp. rate 18, height 5\' 9"  (1.753 m), weight 59 kg, SpO2 100 %.Body mass index is 19.2 kg/m.  General Appearance:  Fairly Groomed  Eye Contact:  Good  Speech:  Clear and Coherent  Volume:  Decreased  Mood:  Dysphoric  Affect:  Constricted and Flat  Thought Process:  Linear and Descriptions of Associations: Circumstantial  Orientation:  Full (Time, Place, and Person)  Thought Content:  Logical and Rumination  Suicidal Thoughts:  No  Homicidal Thoughts:  No  Memory:  Immediate;   Fair  Judgement:  Fair  Insight:  Fair  Psychomotor Activity:  Decreased  Concentration:  Concentration: Fair  Recall:  [employer] of Knowledge:  Fair  Language:  Fair but mainly reflecting poverty of content of thought and speech is minimal but he is no longer mute or displaying thought blocking  Akathisia:  Negative  Handed:  Right  AIMS (if indicated):     Assets:  Resilience Social Support  ADL's:  Intact  Cognition:  WNL  Sleep:  Number of Hours: 6.75     Treatment Plan Summary: Daily contact with patient to assess and evaluate symptoms and progress in treatment, Medication management and Plan Continue current cognitive work and med and illness education administer long-acting injectable today probable discharge Monday  Malvin Johns, MD 07/16/2018, 10:09 AM

## 2018-07-17 DIAGNOSIS — F2081 Schizophreniform disorder: Secondary | ICD-10-CM

## 2018-07-17 DIAGNOSIS — F1220 Cannabis dependence, uncomplicated: Secondary | ICD-10-CM

## 2018-07-17 MED ORDER — ACETAMINOPHEN 325 MG PO TABS
650.0000 mg | ORAL_TABLET | Freq: Four times a day (QID) | ORAL | Status: DC | PRN
  Administered 2018-07-17: 12:00:00 650 mg via ORAL

## 2018-07-17 NOTE — [facility] Group Notes (Signed)
[facility] LCSW Group Therapy Note  07/17/2018  10:00-11:00AM  Type of Therapy and Topic:  Group Therapy - Accepting We Are All Damaged People  Participation Level:  Minimal   Description of Group:  Patients in this group were asked to share whether they feel that they are "damaged" and explain their responses.  A song entitled "Damaged People" was then played, followed by a discussion of the relevance/relatedness of this song to each patient.   The conclusion of the group was that our goal as humans does not need to be perfection, but rather growth.  Insights among group members were shared, including that it is easy to point the fingers at others as being damaged, but actually we need to realize that we also are flawed humans with problems to overcome.  The group concluded with an emphasis on how this is ultimately a message of hope that we face struggles like every other person in the world, and that we are not alone.  Therapeutic Goals: 1)  introduce the concept of pain and hardship being universal  2)  connect emotionally to a musical message and to other group members  3)  identify the patient's current beliefs about their own broken methods of resolving their life problems to date, specifically related to this hospitalization  4)  allow time and space for patients to vent their pain and receive support from other patients  5)  elicit hope that arises from realizing we are not alone in our human struggles   Summary of Patient Progress:  The patient expressed that he does feel he is "damaged," because he is sad and lonely.  He did not talk at all during group.  Therapeutic Modalities:   Motivational Interviewing Activity  Lynnell Chad  07/17/2018 2:44 PM

## 2018-07-17 NOTE — Plan of Care (Signed)
Progress note  Pt found in bed; compliant with medication administration. Pt denies any physical complaints but does endorse a headache that he rates at a 10/10. Pt is still minimal and isolative but has been in the milieu today. Pt denies si/hi/ah/vh and verbally agrees to approach staff if these become apparent or before harming himself/others while at [facility]. Pt safe on the unit. Pt provided support and encouragement. Will continue to monitor.   Pt progressing in the following metrics  Problem: Education: Goal: Knowledge of [place] [facility] Education information/materials will improve Outcome: Progressing Goal: Verbalization of understanding the information provided will improve Outcome: Progressing   Problem: Education: Goal: Knowledge of disease or condition will improve Outcome: Progressing Goal: Understanding of discharge needs will improve Outcome: Progressing

## 2018-07-17 NOTE — Progress Notes (Signed)
[facility] MD Progress Note  07/17/2018 12:54 PM Jeremy Taylor  MRN:  [id number] Subjective: Patient is a 18 year old male with a past psychiatric history significant for schizophrenia who was admitted on 07/14/2018 secondary to psychotic symptoms and apparently killing 12 pets.  Objective: Patient is seen and examined.  Patient is a 18 year old male with the above-stated past psychiatric history who is seen in follow-up.  He is currently being treated with Abilify extended release, Cogentin, hydroxyzine, omega-3 fatty acids, propranolol and Risperdal.  His blood pressure is mildly elevated, pulse is within normal limits.  He is afebrile.  He slept 4.25 hours last night.  Review of his laboratories showed that all were essentially normal.  No drug screen was evaluated.  Patient stated he was doing better today.  He stated he was not hearing the voices that he had been hearing earlier.  He denied any side effects to his medications except for a headache yesterday.  It was still present this morning, and he received some Tylenol and he said his headache was much better.  He denied any other side effects, and denied any suicidal or homicidal ideation.  Principal Problem: <principal problem not specified> Diagnosis: Active Problems:   Delusional disorder (HCC)   Paranoid schizophrenia (HCC)  Total Time spent with patient: 15 minutes  Past Psychiatric History: See admission H&P  Past Medical History: History reviewed. No pertinent past medical history. History reviewed. No pertinent surgical history. Family History: History reviewed. No pertinent family history. Family Psychiatric  History: See admission H&P Social History:  Social History   Substance and Sexual Activity  Alcohol Use No     Social History   Substance and Sexual Activity  Drug Use No    Social History   Socioeconomic History  . Marital status: Single    Spouse name: Not on file  . Number of children: Not on file  . Years of  education: Not on file  . Highest education level: Not on file  Occupational History  . Not on file  Social Needs  . Financial resource strain: Not on file  . Food insecurity:    Worry: Not on file    Inability: Not on file  . Transportation needs:    Medical: Not on file    Non-medical: Not on file  Tobacco Use  . Smoking status: Current Every Day Smoker    Packs/day: 1.00    Types: Cigarettes  . Smokeless tobacco: Never Used  Substance and Sexual Activity  . Alcohol use: No  . Drug use: No  . Sexual activity: Not on file  Lifestyle  . Physical activity:    Days per week: Not on file    Minutes per session: Not on file  . Stress: Not on file  Relationships  . Social connections:    Talks on phone: Not on file    Gets together: Not on file    Attends religious service: Not on file    Active member of club or organization: Not on file    Attends meetings of clubs or organizations: Not on file    Relationship status: Not on file  Other Topics Concern  . Not on file  Social History Narrative  . Not on file   Additional Social History:    Pain Medications: See MARs Prescriptions: See MARs Over the Counter: See MARs History of alcohol / drug use?: Yes Negative Consequences of Use: Personal relationships Name of Substance 1: Alcohol 1 - Age of First  Use: unknown                  Sleep: Fair  Appetite:  Good  Current Medications: Current Facility-Administered Medications  Medication Dose Route Frequency Provider Last Rate Last Dose  . acetaminophen (TYLENOL) tablet 650 mg  650 mg Oral Q6H PRN Antonieta Pertlary,  Lawson, MD   650 mg at 07/17/18 1201  . ARIPiprazole ER (ABILIFY MAINTENA) injection 400 mg  400 mg Intramuscular Q28 days Malvin JohnsFarah, Brian, MD   400 mg at 07/16/18 1211  . benztropine (COGENTIN) tablet 1 mg  1 mg Oral BID Malvin JohnsFarah, Brian, MD   1 mg at 07/17/18 0754  . feeding supplement (ENSURE ENLIVE) (ENSURE ENLIVE) liquid 237 mL  237 mL Oral Q24H Malvin JohnsFarah, Brian,  MD   237 mL at 07/15/18 2148  . hydrOXYzine (ATARAX/VISTARIL) tablet 50 mg  50 mg Oral Q6H PRN Kerry HoughSimon, Spencer E, PA-C   50 mg at 07/14/18 1247  . LORazepam (ATIVAN) tablet 2 mg  2 mg Oral Q4H PRN Malvin JohnsFarah, Brian, MD       Or  . LORazepam (ATIVAN) injection 2 mg  2 mg Intramuscular Q4H PRN Malvin JohnsFarah, Brian, MD      . multivitamin with minerals tablet 1 tablet  1 tablet Oral Daily Malvin JohnsFarah, Brian, MD   1 tablet at 07/17/18 1201  . nicotine polacrilex (NICORETTE) gum 2 mg  2 mg Oral PRN Donell SievertSimon, Spencer E, PA-C   2 mg at 07/15/18 1601  . OLANZapine zydis (ZYPREXA) disintegrating tablet 10 mg  10 mg Oral Q8H PRN Kerry HoughSimon, Spencer E, PA-C   10 mg at 07/14/18 1247   And  . ziprasidone (GEODON) injection 20 mg  20 mg Intramuscular PRN Donell SievertSimon, Spencer E, PA-C      . omega-3 acid ethyl esters (LOVAZA) capsule 1 g  1 g Oral BID Malvin JohnsFarah, Brian, MD   1 g at 07/17/18 0754  . propranolol (INDERAL) tablet 20 mg  20 mg Oral BID Malvin JohnsFarah, Brian, MD   20 mg at 07/17/18 0754  . risperiDONE (RISPERDAL) tablet 3 mg  3 mg Oral Daily Malvin JohnsFarah, Brian, MD   3 mg at 07/17/18 0754  . risperiDONE (RISPERDAL) tablet 6 mg  6 mg Oral QHS Malvin JohnsFarah, Brian, MD   6 mg at 07/16/18 2109  . temazepam (RESTORIL) capsule 30 mg  30 mg Oral QHS Malvin JohnsFarah, Brian, MD   30 mg at 07/16/18 2109    Lab Results: No results found for this or any previous visit (from the past 48 hour(s)).  Blood Alcohol level:  Lab Results  Component Value Date   ETH 59 (H) 03/25/2013    Metabolic Disorder Labs: No results found for: HGBA1C, MPG No results found for: PROLACTIN No results found for: CHOL, TRIG, HDL, CHOLHDL, VLDL, LDLCALC  Physical Findings: AIMS: Facial and Oral Movements Muscles of Facial Expression: None, normal Lips and Perioral Area: None, normal Jaw: None, normal Tongue: None, normal,Extremity Movements Upper (arms, wrists, hands, fingers): None, normal Lower (legs, knees, ankles, toes): None, normal, Trunk Movements Neck, shoulders, hips: None, normal,  Overall Severity Severity of abnormal movements (highest score from questions above): None, normal Incapacitation due to abnormal movements: None, normal Patient's awareness of abnormal movements (rate only patient's report): No Awareness, Dental Status Current problems with teeth and/or dentures?: No Does patient usually wear dentures?: No  CIWA:  CIWA-Ar Total: 1 COWS:  COWS Total Score: 1  Musculoskeletal: Strength & Muscle Tone: within normal limits Gait & Station: normal Patient leans:  N/A  Psychiatric Specialty Exam: Physical Exam  Nursing note and vitals reviewed. Constitutional: He is oriented to person, place, and time. He appears well-developed and well-nourished.  HENT:  Head: Normocephalic and atraumatic.  Respiratory: Effort normal.  Neurological: He is alert and oriented to person, place, and time.    ROS  Blood pressure (!) 139/92, pulse 61, temperature 97.6 F (36.4 C), temperature source Oral, resp. rate 18, height  (1.753 m), weight 59 kg, SpO2 100 %.Body mass index is 19.2 kg/m.  General Appearance: Casual  Eye Contact:  Fair  Speech:  Normal Rate  Volume:  Normal  Mood:  Euthymic  Affect:  Constricted  Thought Process:  Coherent and Descriptions of Associations: Intact  Orientation:  Full (Time, Place, and Person)  Thought Content:  Hallucinations: Auditory  Suicidal Thoughts:  No  Homicidal Thoughts:  No  Memory:  Immediate;   Fair Recent;   Fair Remote;   Fair  Judgement:  Intact  Insight:  Fair  Psychomotor Activity:  Psychomotor Retardation  Concentration:  Concentration: Fair and Attention Span: Fair  Recall:  [employer] of Knowledge:  Fair  Language:  Fair  Akathisia:  Negative  Handed:  Right  AIMS (if indicated):     Assets:  Desire for Improvement Resilience  ADL's:  Intact  Cognition:  WNL  Sleep:  Number of Hours: 4.25     Treatment Plan Summary: Daily contact with patient to assess and evaluate symptoms and progress in  treatment, Medication management and Plan : Patient is seen and examined.  Patient is a 18 year old male with the above-stated past psychiatric history who is seen in follow-up.   Diagnosis: #1 schizophrenia versus schizophreniform disorder  Patient is seen in follow-up today.  He denied any auditory or visual hallucinations.  He did have a headache and that was the only side effect of medication.  Looks like he received the Abilify long-acting injection on 07/16/2018.  He continues on the rest of his oral medications.  No change in his current medications, and hopefully he will continue to improve.  His sleep has not been good at night, but he has remained in bed a great deal and sleeping during the daytime because of boredom. 1.  Patient received long-acting Abilify injection on 07/16/2018 for psychosis. 2.  Continue Cogentin 1 mg p.o. twice daily for side effects of antipsychotic medication. 3.  Continue multivitamin 1 tablet p.o. daily. 4.  Continue omega-3 fatty acids for neuro protection. 5.  Continue propranolol 20 mg p.o. twice daily for tremor and anxiety. 6.  Continue Risperdal 3 mg p.o. daily and 6 mg p.o. nightly for psychosis. 7.  Continue temazepam 50 mg p.o. nightly for insomnia. 8.  Disposition planning-in progress.  Antonieta Pert, MD 07/17/2018, 12:54 PM

## 2018-07-17 NOTE — Progress Notes (Signed)
Patient has been up and active on the unit, observed up in the dayroom interacting with other peers and has voiced no complaints.  He was compliant with medications.Patient currently denies having pain, -si/hi/a/v hall. Support and encouragement offered, safety maintained on unit, will continue to monitor.

## 2018-07-18 NOTE — [facility] Group Notes (Signed)
[facility] LCSW Group Therapy Note  Date/Time:  07/18/2018  11:00AM-12:00PM  Type of Therapy and Topic:  Group Therapy:  Music and Mood  Participation Level:  None   Description of Group: In this process group, members listened to a variety of genres of music and identified that different types of music evoke different responses.  Patients were encouraged to identify music that was soothing for them and music that was energizing for them.  Patients discussed how this knowledge can help with wellness and recovery in various ways including managing depression and anxiety as well as encouraging healthy sleep habits.    Therapeutic Goals: 1. Patients will explore the impact of different varieties of music on mood 2. Patients will verbalize the thoughts they have when listening to different types of music 3. Patients will identify music that is soothing to them as well as music that is energizing to them 4. Patients will discuss how to use this knowledge to assist in maintaining wellness and recovery 5. Patients will explore the use of music as a coping skill  Summary of Patient Progress:  At the beginning of group, patient expressed that he feels alone but "happy to be alive."  He has not had contact with mother today.  At the end of group he said he felt better.  But he did not actively participate at any point in group.  Therapeutic Modalities: Solution Focused Brief Therapy Activity   Ambrose Mantle, LCSW

## 2018-07-18 NOTE — [facility] Group Notes (Signed)
[facility] Group Notes:  (Nursing/MHT/Case Management/Adjunct)  Date:  07/17/2018  Time:  4:00 PM  Type of Therapy:  Nurse Education  Participation Level:  Active  Participation Quality:  Appropriate and Attentive  Affect:  Appropriate  Cognitive:  Alert and Appropriate  Insight:  Appropriate  Engagement in Group:  Engaged and Improving  Modes of Intervention:  Discussion and Education  Summary of Progress/Problems: Pt's discussed anger, triggers, what defines who we are, and how we can cope with the situations/triggers we can't control  Raylene Miyamoto 07/18/2018, 9:41 AM

## 2018-07-18 NOTE — Plan of Care (Signed)
Progress note  Pt found in bed; compliant with medication administration. Pt denies any physical problems, and states his headaches have subsided. Pt denies si/hi/ah/vh and verbally agrees to approach staff if these become apparent or before harming himself/others while at [facility]. Pt is still blank, flat, and withdrawn, but has been seen in the milieu. Pt provided support and encouragement. Pt given medication per protocol and standing orders. Q44m safety checks implemented and continued. Will continue to monitor.   Pt progressing in the following metrics  Problem: Health Behavior/Discharge Planning: Goal: Ability to identify changes in lifestyle to reduce recurrence of condition will improve Outcome: Progressing Goal: Identification of resources available to assist in meeting health care needs will improve Outcome: Progressing   Problem: Coping: Goal: Coping ability will improve Outcome: Progressing   Problem: Health Behavior/Discharge Planning: Goal: Identification of resources available to assist in meeting health care needs will improve Outcome: Progressing

## 2018-07-18 NOTE — Progress Notes (Signed)
[facility] MD Progress Note  07/18/2018 11:00 AM Jeremy Taylor  MRN:  [id number] Subjective:  Patient is a 18 year old male with a past psychiatric history significant for schizophrenia who was admitted on 07/14/2018 secondary to psychotic symptoms and apparently killing 12 pets.  Objective: Patient is seen and examined.  Patient is a 18 year old male with the above-stated past psychiatric history who is seen in follow-up.  He is currently being treated with Abilify extended release, Cogentin, hydroxyzine, omega-3 fatty acids, propranolol and Risperdal.  His vital signs are stable, he is afebrile.  He slept 6.75 hours last night.  He is essentially unchanged from yesterday.  He denied any auditory or visual hallucinations.  He denied any suicidal or homicidal ideation.  He denied any desire to harm animals.  His main question is when he is going home.  Principal Problem: <principal problem not specified> Diagnosis: Active Problems:   Delusional disorder (HCC)   Paranoid schizophrenia (HCC)  Total Time spent with patient: 20 minutes  Past Psychiatric History: See admission H&P  Past Medical History: History reviewed. No pertinent past medical history. History reviewed. No pertinent surgical history. Family History: History reviewed. No pertinent family history. Family Psychiatric  History: See admission H&P Social History:  Social History   Substance and Sexual Activity  Alcohol Use No     Social History   Substance and Sexual Activity  Drug Use No    Social History   Socioeconomic History  . Marital status: Single    Spouse name: Not on file  . Number of children: Not on file  . Years of education: Not on file  . Highest education level: Not on file  Occupational History  . Not on file  Social Needs  . Financial resource strain: Not on file  . Food insecurity:    Worry: Not on file    Inability: Not on file  . Transportation needs:    Medical: Not on file    Non-medical:  Not on file  Tobacco Use  . Smoking status: Current Every Day Smoker    Packs/day: 1.00    Types: Cigarettes  . Smokeless tobacco: Never Used  Substance and Sexual Activity  . Alcohol use: No  . Drug use: No  . Sexual activity: Not on file  Lifestyle  . Physical activity:    Days per week: Not on file    Minutes per session: Not on file  . Stress: Not on file  Relationships  . Social connections:    Talks on phone: Not on file    Gets together: Not on file    Attends religious service: Not on file    Active member of club or organization: Not on file    Attends meetings of clubs or organizations: Not on file    Relationship status: Not on file  Other Topics Concern  . Not on file  Social History Narrative  . Not on file   Additional Social History:    Pain Medications: See MARs Prescriptions: See MARs Over the Counter: See MARs History of alcohol / drug use?: Yes Negative Consequences of Use: Personal relationships Name of Substance 1: Alcohol 1 - Age of First Use: unknown                  Sleep: Good  Appetite:  Good  Current Medications: Current Facility-Administered Medications  Medication Dose Route Frequency Provider Last Rate Last Dose  . acetaminophen (TYLENOL) tablet 650 mg  650 mg Oral Q6H  PRN Antonieta Pert, MD   650 mg at 07/17/18 1201  . ARIPiprazole ER (ABILIFY MAINTENA) injection 400 mg  400 mg Intramuscular Q28 days Malvin Johns, MD   400 mg at 07/16/18 1211  . benztropine (COGENTIN) tablet 1 mg  1 mg Oral BID Malvin Johns, MD   1 mg at 07/18/18 0815  . feeding supplement (ENSURE ENLIVE) (ENSURE ENLIVE) liquid 237 mL  237 mL Oral Q24H Malvin Johns, MD   237 mL at 07/17/18 1941  . hydrOXYzine (ATARAX/VISTARIL) tablet 50 mg  50 mg Oral Q6H PRN Donell Sievert E, PA-C   50 mg at 07/14/18 1247  . LORazepam (ATIVAN) tablet 2 mg  2 mg Oral Q4H PRN Malvin Johns, MD       Or  . LORazepam (ATIVAN) injection 2 mg  2 mg Intramuscular Q4H PRN Malvin Johns, MD      . multivitamin with minerals tablet 1 tablet  1 tablet Oral Daily Malvin Johns, MD   1 tablet at 07/18/18 0816  . nicotine polacrilex (NICORETTE) gum 2 mg  2 mg Oral PRN Donell Sievert E, PA-C   2 mg at 07/15/18 1601  . OLANZapine zydis (ZYPREXA) disintegrating tablet 10 mg  10 mg Oral Q8H PRN Kerry Hough, PA-C   10 mg at 07/14/18 1247   And  . ziprasidone (GEODON) injection 20 mg  20 mg Intramuscular PRN Donell Sievert E, PA-C      . omega-3 acid ethyl esters (LOVAZA) capsule 1 g  1 g Oral BID Malvin Johns, MD   1 g at 07/18/18 0815  . propranolol (INDERAL) tablet 20 mg  20 mg Oral BID Malvin Johns, MD   20 mg at 07/18/18 0815  . risperiDONE (RISPERDAL) tablet 3 mg  3 mg Oral Daily Malvin Johns, MD   3 mg at 07/18/18 0815  . risperiDONE (RISPERDAL) tablet 6 mg  6 mg Oral QHS Malvin Johns, MD   6 mg at 07/17/18 2124  . temazepam (RESTORIL) capsule 30 mg  30 mg Oral QHS Malvin Johns, MD   30 mg at 07/17/18 2124    Lab Results: No results found for this or any previous visit (from the past 48 hour(s)).  Blood Alcohol level:  Lab Results  Component Value Date   ETH 59 (H) 03/25/2013    Metabolic Disorder Labs: No results found for: HGBA1C, MPG No results found for: PROLACTIN No results found for: CHOL, TRIG, HDL, CHOLHDL, VLDL, LDLCALC  Physical Findings: AIMS: Facial and Oral Movements Muscles of Facial Expression: None, normal Lips and Perioral Area: None, normal Jaw: None, normal Tongue: None, normal,Extremity Movements Upper (arms, wrists, hands, fingers): None, normal Lower (legs, knees, ankles, toes): None, normal, Trunk Movements Neck, shoulders, hips: None, normal, Overall Severity Severity of abnormal movements (highest score from questions above): None, normal Incapacitation due to abnormal movements: None, normal Patient's awareness of abnormal movements (rate only patient's report): No Awareness, Dental Status Current problems with teeth and/or  dentures?: No Does patient usually wear dentures?: No  CIWA:  CIWA-Ar Total: 1 COWS:  COWS Total Score: 1  Musculoskeletal: Strength & Muscle Tone: within normal limits Gait & Station: normal Patient leans: N/A  Psychiatric Specialty Exam: Physical Exam  Nursing note and vitals reviewed. Constitutional: He is oriented to person, place, and time. He appears well-developed and well-nourished.  HENT:  Head: Normocephalic and atraumatic.  Respiratory: Effort normal.  Neurological: He is alert and oriented to person, place, and time.  ROS  Blood pressure 120/89, pulse 64, temperature 98.2 F (36.8 C), temperature source Oral, resp. rate 18, height 5\' 9"  (1.753 m), weight 59 kg, SpO2 100 %.Body mass index is 19.2 kg/m.  General Appearance: Casual  Eye Contact:  Fair  Speech:  Normal Rate  Volume:  Decreased  Mood:  Euthymic  Affect:  Flat  Thought Process:  Coherent and Descriptions of Associations: Intact  Orientation:  Full (Time, Place, and Person)  Thought Content:  Logical  Suicidal Thoughts:  No  Homicidal Thoughts:  No  Memory:  Immediate;   Fair Recent;   Fair Remote;   Fair  Judgement:  Intact  Insight:  Fair  Psychomotor Activity:  Normal  Concentration:  Concentration: Fair and Attention Span: Fair  Recall:  [employer]  Fund of Knowledge:  Fair  Language:  Fair  Akathisia:  Negative  Handed:  Right  AIMS (if indicated):     Assets:  Desire for Improvement Resilience  ADL's:  Intact  Cognition:  WNL  Sleep:  Number of Hours: 6.75     Treatment Plan Summary: Daily contact with patient to assess and evaluate symptoms and progress in treatment, Medication management and Plan : Patient is seen and examined.  Patient is a 18 year old male with the above-stated past psychiatric history who is seen in follow-up.    Diagnosis: #1 schizophrenia versus schizophreniform disorder  Patient is seen in follow-up today.  He is essentially unchanged from yesterday.  He  denied any side effects to his current medications.  No auditory or visual hallucinations.  He has remained in his bed a great deal, and has been sleeping some time during the day.  No other changes in his medications.  1.  Patient received long-acting Abilify injection on 07/16/2018 for psychosis. 2.  Continue Cogentin 1 mg p.o. twice daily for side effects of antipsychotic medication. 3.  Continue multivitamin 1 tablet p.o. daily. 4.  Continue omega-3 fatty acids for neuro protection. 5.  Continue propranolol 20 mg p.o. twice daily for tremor and anxiety. 6.  Continue Risperdal 3 mg p.o. daily and 6 mg p.o. nightly for psychosis. 7.  Continue temazepam 50 mg p.o. nightly for insomnia. 8.  Disposition planning-in progress. Antonieta PertGreg Lawson Makira Holleman, MD 07/18/2018, 11:00 AM

## 2018-07-19 MED ORDER — OMEGA-3-ACID ETHYL ESTERS 1 G PO CAPS: 1.0000 g | ORAL_CAPSULE | Freq: Two times a day (BID) | ORAL | 2 refills | Status: DC

## 2018-07-19 MED ORDER — PROPRANOLOL HCL 20 MG PO TABS: 20.0000 mg | ORAL_TABLET | Freq: Two times a day (BID) | ORAL | 3 refills | Status: DC

## 2018-07-19 MED ORDER — BENZTROPINE MESYLATE 1 MG PO TABS: 1.0000 mg | ORAL_TABLET | Freq: Two times a day (BID) | ORAL | 1 refills | Status: DC

## 2018-07-19 MED ORDER — RISPERIDONE 3 MG PO TABS: ORAL_TABLET | ORAL | 2 refills | Status: DC

## 2018-07-19 MED ORDER — TEMAZEPAM 30 MG PO CAPS: 30.0000 mg | ORAL_CAPSULE | Freq: Every day | ORAL | 0 refills | Status: DC

## 2018-07-19 MED ORDER — ARIPIPRAZOLE ER 400 MG IM SRER: 400.0000 mg | INTRAMUSCULAR | 11 refills | Status: DC

## 2018-07-19 MED ORDER — ADULT MULTIVITAMIN W/MINERALS CH: 1.0000 | ORAL_TABLET | Freq: Every day | ORAL | 1 refills | Status: DC

## 2018-07-19 NOTE — [facility] Suicide Risk Assessment (Signed)
[facility] Discharge Suicide Risk Assessment   Principal Problem:-exacerbation of psychotic disorder Discharge Diagnoses: Active Problems:   Delusional disorder (HCC)   Paranoid schizophrenia (HCC)   Total Time spent with patient: 45 min  Musculoskeletal: Strength & Muscle Tone: within normal limits Gait & Station: normal Patient leans: N/A  Psychiatric Specialty Exam: ROS  Blood pressure 125/65, pulse 65, temperature 97.8 F (36.6 C), temperature source Oral, resp. rate 18, height 5\' 9"  (1.753 m), weight 59 kg, SpO2 100 %.Body mass index is 19.2 kg/m.  General Appearance: Casual  Eye Contact::  Good  Speech:  Clear and Coherent409  Volume:  Decreased  Mood:  Dysphoric  Affect:  Flat  Thought Process:  Goal Directed and Linear  Orientation:  Full (Time, Place, and Person)  Thought Content:  Rumination and Tangential  Suicidal Thoughts:  No  Homicidal Thoughts:  No  Memory:  Immediate;   Fair  Judgement:  Fair  Insight:  Fair  Psychomotor Activity:  Normal  Concentration:  Good  Recall:  Good  Fund of Knowledge:Good  Language: Good  Akathisia:  Negative  Handed:  Right  AIMS (if indicated):     Assets:  Financial Resources/Insurance Housing Intimacy Leisure Time Physical Health Resilience  Sleep:  Number of Hours: 6  Cognition: WNL  ADL's:  Intact   Mental Status Per Nursing Assessment::   On Admission:  Suicidal ideation indicated by patient  Demographic Factors:  Unemployed  Loss Factors: Decrease in vocational status  Historical Factors: Impulsivity  Risk Reduction Factors:   Sense of responsibility to family, Religious beliefs about death and Living with another person, especially a relative  Continued Clinical Symptoms:  Schizophrenia:   Paranoid or undifferentiated type  Cognitive Features That Contribute To Risk:  None    Suicide Risk:  Minimal: No identifiable suicidal ideation.  Patients presenting with no risk factors but with morbid  ruminations; may be classified as minimal risk based on the severity of the depressive symptoms  Follow-up Information    [facility] Follow up on 07/22/2018.   Why:  Hospital follow up appointment is Thursday, 5/14 at 9:30a.  The appointment will be held over the phone and the provider will contact you.  Contact information: [address] [place] [address] [phone]           Plan Of Care/Follow-up recommendations:  Activity:  full  Hawkins Seaman, MD 07/19/2018, 11:27 AM

## 2018-07-19 NOTE — Discharge Summary (Signed)
Physician Discharge Summary Note  Patient:  Jeremy Taylor is an 18 y.o., male MRN:  [id number] DOB:  08/16/2000 Patient phone:  [phone] (home)  Patient address:   [address] [place] [address],  Total Time spent with patient: 45 minutes  Date of Admission:  07/13/2018 Date of Discharge: 07/19/2018  Reason for Admission:   History of Present Illness:   This is the first psychiatric admission here or elsewhere for Mr. Jeremy MariscalMcKinnon, 18 year old single patient petition by his live-in girlfriend for psychotic symptoms, and killing of 12 pets. The couple lives together they have 2 children and the bottom line is Mr. Jeremy MariscalMcKinnon is a daily cannabis user on a long-term basis as is his girlfriend (spoke to her with his permission) he nodded it was okay to call her.  At any rate she is the petitioner. Patient began having auditory hallucinations at the end of February and into early March and it persisted the voices tell him to harm animals and harm people and he is threatened her as well.  Further he has been killing their pet chickens and their [place], and she put a baby cam in the cooper to make sure it was him doing it.  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  He refused to talk for prolonged period of time.  Later on in the morning he did agree to an interview  So he is alert he is oriented to person presumably situation but he was mute for prolonged periods of time when explained to him that I am the doctor and if he wants to leave the hospital he must talk to me at some point he began to give me very short simple answers he nodded yes when asked if he was having auditory hallucinations he described them as outside his head would not elaborate on content he nodded no when asked if he want to harm himself or anyone else.  Again very little information otherwise.  He denied drug use but his girlfriend acknowledged  daily cannabis use multiple times a day for chronic period of time.  Further he did act bizarrely later in the morning and began a racing R dry erase board with patient names and so forth on it and had to be given PRN lorazepam and Zyprexa.  I did explain risk benefits side effects of meds with are lost on him due to his psychotic condition at this point in time  Principal Problem: Schizophrenic disorder complicated by chronic cannabis dependency Discharge Diagnoses: Active Problems:   Delusional disorder (HCC)   Paranoid schizophrenia (HCC)   Past Medical History: History reviewed. No pertinent past medical history. History reviewed. No pertinent surgical history. Family History: History reviewed. No pertinent family history. Family Psychiatric  History: neg Social History:  Social History   Substance and Sexual Activity  Alcohol Use No     Social History   Substance and Sexual Activity  Drug Use No    Social History   Socioeconomic History  . Marital status: Single    Spouse name: Not on file  . Number of children: Not on file  . Years of education: Not on file  . Highest education level: Not on file  Occupational History  . Not on file  Social Needs  . Financial resource strain: Not on file  . Food insecurity:    Worry: Not on file    Inability:  Not on file  . Transportation needs:    Medical: Not on file    Non-medical: Not on file  Tobacco Use  . Smoking status: Current Every Day Smoker    Packs/day: 1.00    Types: Cigarettes  . Smokeless tobacco: Never Used  Substance and Sexual Activity  . Alcohol use: No  . Drug use: No  . Sexual activity: Not on file  Lifestyle  . Physical activity:    Days per week: Not on file    Minutes per session: Not on file  . Stress: Not on file  Relationships  . Social connections:    Talks on phone: Not on file    Gets together: Not on file    Attends religious service: Not on file    Active member of club or organization:  Not on file    Attends meetings of clubs or organizations: Not on file    Relationship status: Not on file  Other Topics Concern  . Not on file  Social History Narrative  . Not on file    Hospital Course:   Patient generally compliant with meds here he was initially mute for periods of time but overall cooperated became more verbal but still was somewhat withdrawn through his stay. Again he had prominent negative and positive symptoms endorsing auditory loose Nations but by the date of the 11th he denied any auditory or visual loose Nations not thoughts of harming himself or pets or anything like that was contracting fully I spoke with his girlfriend they are welcome back for period of time in the apartment complex but they do have to move within a specified period of time so he will not be homeless nor living with his mother whom we can never really get a hold of no matter what number he gave [place] to call.  At the date of discharge alert and oriented without auditory loose Nations or thoughts of harming self or anyone else or anything else contracting fully understands what that means.  Physical Findings: AIMS: Facial and Oral Movements Muscles of Facial Expression: None, normal Lips and Perioral Area: None, normal Jaw: None, normal Tongue: None, normal,Extremity Movements Upper (arms, wrists, hands, fingers): None, normal Lower (legs, knees, ankles, toes): None, normal, Trunk Movements Neck, shoulders, hips: None, normal, Overall Severity Severity of abnormal movements (highest score from questions above): None, normal Incapacitation due to abnormal movements: None, normal Patient's awareness of abnormal movements (rate only patient's report): No Awareness, Dental Status Current problems with teeth and/or dentures?: No Does patient usually wear dentures?: No  CIWA:  CIWA-Ar Total: 1 COWS:  COWS Total Score: 1   Musculoskeletal: Strength & Muscle Tone: within normal limits Gait &  Station: normal Patient leans: N/A  Psychiatric Specialty Exam: ROS  Blood pressure 125/65, pulse 65, temperature 97.8 F (36.6 C), temperature source Oral, resp. rate 18, height 5\' 9"  (1.753 m), weight 59 kg, SpO2 100 %.Body mass index is 19.2 kg/m.  General Appearance: Casual  Eye Contact::  Good  Speech:  Clear and Coherent409  Volume:  Decreased  Mood:  Dysphoric  Affect:  Flat  Thought Process:  Goal Directed and Linear  Orientation:  Full (Time, Place, and Person)  Thought Content:  Rumination and Tangential  Suicidal Thoughts:  No  Homicidal Thoughts:  No  Memory:  Immediate;   Fair  Judgement:  Fair  Insight:  Fair  Psychomotor Activity:  Normal  Concentration:  Good  Recall:  Good  Fund of  Knowledge:Good  Language: Good  Akathisia:  Negative  Handed:  Right  AIMS (if indicated):     Assets:  Financial Resources/Insurance Housing Intimacy Leisure Time Physical Health Resilience  Sleep:  Number of Hours: 6  Cognition: WNL  ADL's:  Intact     Have you used any form of tobacco in the last 30 days? (Cigarettes, Smokeless Tobacco, Cigars, and/or Pipes): Yes  Has this patient used any form of tobacco in the last 30 days? (Cigarettes, Smokeless Tobacco, Cigars, and/or Pipes) Yes, No  Blood Alcohol level:  Lab Results  Component Value Date   ETH 59 (H) 03/25/2013    Metabolic Disorder Labs:  No results found for: HGBA1C, MPG No results found for: PROLACTIN No results found for: CHOL, TRIG, HDL, CHOLHDL, VLDL, LDLCALC  See Psychiatric Specialty Exam and Suicide Risk Assessment completed by Attending Physician prior to discharge.  Discharge destination:  Home  Is patient on multiple antipsychotic therapies at discharge:  No   Has Patient had three or more failed trials of antipsychotic monotherapy by history:  No  Recommended Plan for Multiple Antipsychotic Therapies: NA   Allergies as of 07/19/2018   No Known Allergies     Medication List     TAKE these medications     Indication  ARIPiprazole ER 400 MG Srer injection Commonly known as:  ABILIFY MAINTENA Inject 2 mLs (400 mg total) into the muscle every 28 (twenty-eight) days. Due 6/8 Start taking on:  August 13, 2018  Indication:  Schizophrenia   benztropine 1 MG tablet Commonly known as:  COGENTIN Take 1 tablet (1 mg total) by mouth 2 (two) times daily.  Indication:  Extrapyramidal Reaction caused by Medications   multivitamin with minerals Tabs tablet Take 1 tablet by mouth daily. Start taking on:  Jul 20, 2018  Indication:  21-Hydroxylase Deficiency   omega-3 acid ethyl esters 1 g capsule Commonly known as:  LOVAZA Take 1 capsule (1 g total) by mouth 2 (two) times daily.  Indication:  High Amount of Triglycerides in the Blood   propranolol 20 MG tablet Commonly known as:  INDERAL Take 1 tablet (20 mg total) by mouth 2 (two) times daily.  Indication:  High Blood Pressure Disorder   risperiDONE 3 MG tablet Commonly known as:  RISPERDAL 1 in am 2 at hs  Indication:  Delusions of Parasitosis, Schizophrenia   temazepam 30 MG capsule Commonly known as:  RESTORIL Take 1 capsule (30 mg total) by mouth at bedtime.  Indication:  Trouble Sleeping      Follow-up Information    [facility] Follow up on 07/22/2018.   Why:  Hospital follow up appointment is Thursday, 5/14 at 9:30a.  The appointment will be held over the phone and the provider will contact you.  Contact information: [address] [place] [address] [phone]           SignedMalvin Johns, MD 07/19/2018, 11:33 AM

## 2018-07-19 NOTE — Progress Notes (Signed)
Recreation Therapy Notes  Date: 5.11.20 Time: 1000 Location: 500 Hall Dayroom  Group Topic: Coping Skills  Goal Area(s) Addresses:  Patient will identify positive coping skills. Patient will identify benefits of using coping skills post d/c.  Intervention:  Worksheet, pencils  Activity: Mind map.  LRT filled in the first eight boxes (anger, depression, peer pressure, anxiety, frustration, addiction, OCD/PTSD, and work) with the patients.  Patients were to then come up with three coping skills for each trigger identified.  LRT will write the coping skills on the board the patients were able to come up with.  Education: Pharmacologist, Building control surveyor.   Education Outcome: Acknowledges understanding/In group clarification offered/Needs additional education.   Clinical Observations/Feedback:  Pt did not attend group.     Caroll Rancher, LRT/CTRS     Caroll Rancher A 07/19/2018 11:52 AM

## 2018-07-19 NOTE — Plan of Care (Signed)
Pt did not attend recreation therapy group sessions.   Trinette Vera, LRT/CTRS 

## 2018-07-19 NOTE — Progress Notes (Signed)
Recreation Therapy Notes  INPATIENT RECREATION TR PLAN  Patient Details Name: RAFE MACKOWSKI MRN: [id number] DOB: February 11, 2001 Today's Date: 07/19/2018  Rec Therapy Plan Is patient appropriate for Therapeutic Recreation?: Yes Treatment times per week: about 3 days Estimated Length of Stay: 5-7 days TR Treatment/Interventions: Group participation (Comment)  Discharge Criteria Pt will be discharged from therapy if:: Discharged Treatment plan/goals/alternatives discussed and agreed upon by:: Patient/family  Discharge Summary Short term goals set: See patient care plan Short term goals met: Not met Reason goals not met: Pt did not attend group sessions. Therapeutic equipment acquired: N/A Reason patient discharged from therapy: Discharge from hospital Pt/family agrees with progress & goals achieved: Yes Date patient discharged from therapy: 07/19/18     Victorino Sparrow, LRT/CTRS  Ria Comment, [place] 07/19/2018, 12:13 PM

## 2018-07-19 NOTE — Progress Notes (Signed)
Patient has been up in the dayroom watching tv. He reports having had a good day and is hopeful to discharge on Monday.Support and encouragement, safety maintained on unit with 15 min checks.Marland Kitchen

## 2018-07-19 NOTE — Progress Notes (Signed)
  [facility] Adult Case Management Discharge Plan :  Will you be returning to the same living situation after discharge:  Yes,  with girlfriend At discharge, do you have transportation home?: No. Will take bus.  No bus fare currently.  Do you have the ability to pay for your medications: No. Will work with [employer].   Release of information consent forms completed and in the chart;  Patient's signature needed at discharge.  Patient to Follow up at: Follow-up Information    [facility] Follow up on 07/22/2018.   Why:  Hospital follow up appointment is Thursday, 5/14 at 9:30a.  The appointment will be held over the phone and the provider will contact you.  Contact information: [address] [place] [address] [phone]           Next level of care provider has access to [facility] Link:no  Safety Planning and Suicide Prevention discussed: Yes,  with girlfriend  Have you used any form of tobacco in the last 30 days? (Cigarettes, Smokeless Tobacco, Cigars, and/or Pipes): Yes  Has patient been referred to the Quitline?: Patient refused referral  Patient has been referred for addiction treatment: Yes, Rachell Cipro, LCSW 07/19/2018, 12:27 PM

## 2018-07-19 NOTE — Progress Notes (Signed)
Pt discharged to lobby. Pt was stable and appreciative at that time. All papers, samples and prescriptions were given and valuables returned. Verbal understanding expressed. Denies SI/HI and A/VH. Pt given opportunity to express concerns and ask questions.  

## 2018-08-05 ENCOUNTER — Emergency Department (HOSPITAL_COMMUNITY): Payer: Self-pay

## 2018-08-05 ENCOUNTER — Emergency Department (HOSPITAL_COMMUNITY)
Admission: EM | Admit: 2018-08-05 | Discharge: 2018-08-05 | Disposition: A | Discharge status: Home / Self Care | Payer: Self-pay | Attending: Emergency Medicine | Admitting: Emergency Medicine

## 2018-08-05 DIAGNOSIS — F209 Schizophrenia, unspecified: Secondary | ICD-10-CM | POA: Insufficient documentation

## 2018-08-05 DIAGNOSIS — Z9119 Patient's noncompliance with other medical treatment and regimen: Secondary | ICD-10-CM | POA: Insufficient documentation

## 2018-08-05 DIAGNOSIS — F17210 Nicotine dependence, cigarettes, uncomplicated: Secondary | ICD-10-CM | POA: Insufficient documentation

## 2018-08-05 DIAGNOSIS — R0789 Other chest pain: Secondary | ICD-10-CM | POA: Insufficient documentation

## 2018-08-05 DIAGNOSIS — Z79899 Other long term (current) drug therapy: Secondary | ICD-10-CM | POA: Insufficient documentation

## 2018-08-05 DIAGNOSIS — M545 Low back pain: Secondary | ICD-10-CM | POA: Insufficient documentation

## 2018-08-05 LAB — CBC WITH DIFFERENTIAL/PLATELET
Abs Immature Granulocytes: 0.04 10*3/uL (ref 0.00–0.07)
Basophils Absolute: 0 10*3/uL (ref 0.0–0.1)
Basophils Relative: 0 %
Eosinophils Absolute: 0.1 10*3/uL (ref 0.0–0.5)
Eosinophils Relative: 1 %
HCT: 45.3 % (ref 39.0–52.0)
Hemoglobin: 15 g/dL (ref 13.0–17.0)
Immature Granulocytes: 0 %
Lymphocytes Relative: 22 %
Lymphs Abs: 2 10*3/uL (ref 0.7–4.0)
MCH: 28.8 pg (ref 26.0–34.0)
MCHC: 33.1 g/dL (ref 30.0–36.0)
MCV: 87.1 fL (ref 80.0–100.0)
Monocytes Absolute: 0.4 10*3/uL (ref 0.1–1.0)
Monocytes Relative: 5 %
Neutro Abs: 6.5 10*3/uL (ref 1.7–7.7)
Neutrophils Relative %: 72 %
Platelets: 170 10*3/uL (ref 150–400)
RBC: 5.2 MIL/uL (ref 4.22–5.81)
RDW: 14.1 % (ref 11.5–15.5)
WBC: 9 10*3/uL (ref 4.0–10.5)
nRBC: 0 % (ref 0.0–0.2)

## 2018-08-05 LAB — COMPREHENSIVE METABOLIC PANEL
ALT: 87 U/L — ABNORMAL HIGH (ref 0–44)
AST: 28 U/L (ref 15–41)
Albumin: 4.8 g/dL (ref 3.5–5.0)
Alkaline Phosphatase: 107 U/L (ref 38–126)
Anion gap: 11 (ref 5–15)
BUN: 9 mg/dL (ref 6–20)
CO2: 26 mmol/L (ref 22–32)
Calcium: 10 mg/dL (ref 8.9–10.3)
Chloride: 101 mmol/L (ref 98–111)
Creatinine, Ser: 0.97 mg/dL (ref 0.61–1.24)
GFR calc Af Amer: >60 mL/min (ref >60)
GFR calc non Af Amer: >60 mL/min (ref >60)
Glucose, Bld: 107 mg/dL — ABNORMAL HIGH (ref 70–99)
Potassium: 4 mmol/L (ref 3.5–5.1)
Sodium: 138 mmol/L (ref 135–145)
Total Bilirubin: 0.5 mg/dL (ref 0.3–1.2)
Total Protein: 7.8 g/dL (ref 6.5–8.1)

## 2018-08-05 LAB — ACETAMINOPHEN LEVEL: Acetaminophen (Tylenol), Serum: <10 ug/mL — ABNORMAL LOW (ref 10–30)

## 2018-08-05 LAB — SALICYLATE LEVEL: Salicylate Lvl: 7.2 mg/dL (ref 2.8–30.0)

## 2018-08-05 LAB — ETHANOL: Alcohol, Ethyl (B): <10 mg/dL (ref <10)

## 2018-08-05 MED ORDER — RISPERIDONE 3 MG PO TABS: ORAL_TABLET | ORAL | 2 refills | Status: DC

## 2018-08-05 MED ORDER — PROPRANOLOL HCL 20 MG PO TABS
20.0000 mg | ORAL_TABLET | Freq: Two times a day (BID) | ORAL | Status: DC
  Administered 2018-08-05: 17:00:00 20 mg via ORAL
  Filled 2018-08-05: qty 1

## 2018-08-05 NOTE — Discharge Instructions (Addendum)
[facility] [phone].  You have refills of your prescriptions that you can pick [facility] at [facility].

## 2018-08-05 NOTE — ED Notes (Signed)
Patient transported to X-ray 

## 2018-08-05 NOTE — ED Notes (Signed)
EKG done with changes and 3/10 chest pain.  EKG given to ED provider with no new orders at this time
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
## 2018-08-05 NOTE — ED Triage Notes (Signed)
Pt presents with back pain, out of medications, anterior chest wall pain (from laying on his chest), palpitations. Began 3 weeks ago when he ran out of medications. With command Audio hallucinations telling him to hurt himself, but pt is aware these are hallucinations and does not want to do it.

## 2018-08-05 NOTE — ED Notes (Signed)
Pt c/o 3/10 cp. Another ekg recaptured.

## 2018-08-05 NOTE — BH Assessment (Signed)
Tele Assessment Note   Patient Name: Jeremy Taylor MRN: [id number] Referring Physician: [name] Location of Patient: [facility] Location of Provider: Behavioral Health TTS Department  Jeremy Taylor is an 18 y.o. male. Pt denies SI/HI. Pt denies previous SI attempts. Pt reports AH. Pt states he hears voices telling him to harm himself but he knows they are AH and will not listen. Pt states [facility] and he has not refilled his medication. Pt was suppose to follow-up with [facility]. Per Pt he purposely missed his appointment because he thought he did not need the medication. Per Pt he now realizes he needs his medication in order to prevent the AH.  Pt reports marijuana use.  [name] recommends D/C. [name] will provide a prescription for Risperdal until the [facility].  Diagnosis:  F20.9 Schizophrenia  Past Medical History: No past medical history on file.  No past surgical history on file.  Family History: No family history on file.  Social History:  reports that he has been smoking cigarettes. He has been smoking about 1.00 pack per day. He has never used smokeless tobacco. He reports that he does not drink alcohol or use drugs.  Additional Social History:  Alcohol / Drug Use Pain Medications: please see mar Prescriptions: please see mar Over the Counter: please see mar Longest period of sobriety (when/how long): unknown Substance #1 Name of Substance 1: marijuana 1 - Age of First Use: unknown 1 - Amount (size/oz): unknown 1 - Frequency: unknown 1 - Duration: unknown 1 - Last Use / Amount: unknown  CIWA: CIWA-Ar BP: (!) 155/91 Pulse Rate: (!) 101 COWS:    Allergies: No Known Allergies  Home Medications: (Not in a hospital admission)   OB/GYN Status:  No LMP for male patient.  General Assessment Data [facility] Assessment: In system Is this a Tele or Face-to-Face Assessment?:  Tele Assessment Is this an Initial Assessment or a Re-assessment for this encounter?: Initial Assessment Patient Accompanied by:: N/A Language Other than English: No Living Arrangements: Other (Comment) What gender do you identify as?: Male Marital status: Long term relationship Maiden name: NA Pregnancy Status: No Living Arrangements: Spouse/significant other Can pt return to current living arrangement?: Yes Admission Status: Voluntary Is patient capable of signing voluntary admission?: Yes Referral Source: Self/Family/Friend Insurance type: SP     Crisis Care Plan Living Arrangements: Spouse/significant other Legal Guardian: Other:(self) Name of Psychiatrist: NA Name of Therapist: NA  Education Status Is patient currently in school?: No Highest grade of school patient has completed: 9th grade Is the patient employed, unemployed or receiving disability?: Unemployed  Risk to self with the past 6 months Suicidal Ideation: No Has patient been a risk to self within the past 6 months prior to admission? : No Suicidal Intent: No Has patient had any suicidal intent within the past 6 months prior to admission? : No Is patient at risk for suicide?: No Suicidal Plan?: No Has patient had any suicidal plan within the past 6 months prior to admission? : No Access to Means: No What has been your use of drugs/alcohol within the last 12 months?: NA Previous Attempts/Gestures: No How many times?: 0 Other Self Harm Risks: NA Triggers for Past Attempts: None known Intentional Self Injurious Behavior: None Family Suicide History: No Recent stressful life event(s): Other (Comment)(AH) Persecutory voices/beliefs?: No Depression: No Depression Symptoms: (pt denies) Substance abuse history and/or treatment for substance  abuse?: No Suicide prevention information given to non-admitted patients: Not applicable  Risk to Others within the past 6 months Homicidal Ideation: No Does patient  have any lifetime risk of violence toward others beyond the six months prior to admission? : No Thoughts of Harm to Others: No Current Homicidal Intent: No Current Homicidal Plan: No Access to Homicidal Means: No Identified Victim: NA History of harm to others?: No Assessment of Violence: None Noted Violent Behavior Description: NA Does patient have access to weapons?: No Criminal Charges Pending?: No Does patient have a court date: No Is patient on probation?: No  Psychosis Hallucinations: None noted Delusions: None noted  Mental Status Report Appearance/Hygiene: Unremarkable Eye Contact: Fair Motor Activity: Freedom of movement Speech: Logical/coherent, Soft Level of Consciousness: Quiet/awake Mood: Anxious Affect: Anxious Anxiety Level: Minimal Thought Processes: Coherent, Relevant Judgement: Unimpaired Orientation: Person, Place, Time, Situation Obsessive Compulsive Thoughts/Behaviors: None  Cognitive Functioning Concentration: Normal Memory: Recent Intact, Remote Intact Is patient IDD: No Insight: Fair Impulse Control: Fair Appetite: Fair Have you had any weight changes? : No Change Sleep: Decreased Total Hours of Sleep: 6 Vegetative Symptoms: None  ADLScreening [facility] Assessment Services) Patient's cognitive ability adequate to safely complete daily activities?: Yes Patient able to express need for assistance with ADLs?: Yes Independently performs ADLs?: Yes (appropriate for developmental age)  Prior Inpatient Therapy Prior Inpatient Therapy: Yes Prior Therapy Dates: 07/2018 Prior Therapy Facilty/Provider(s): [facility] Reason for Treatment: Schizoprenhia  Prior Outpatient Therapy Prior Outpatient Therapy: No Does patient have an ACCT team?: No Does patient have Intensive In-House Services?  : No Does patient have [facility] services? : No Does patient have P4CC services?: No  ADL Screening (condition at time of admission) Patient's cognitive ability adequate  to safely complete daily activities?: Yes Is the patient deaf or have difficulty hearing?: No Does the patient have difficulty seeing, even when wearing glasses/contacts?: No Does the patient have difficulty concentrating, remembering, or making decisions?: No Patient able to express need for assistance with ADLs?: Yes Does the patient have difficulty dressing or bathing?: No Independently performs ADLs?: Yes (appropriate for developmental age)       Abuse/Neglect Assessment (Assessment to be complete while patient is alone) Abuse/Neglect Assessment Can Be Completed: Yes Physical Abuse: Denies Verbal Abuse: Denies Sexual Abuse: Denies Exploitation of patient/patient's resources: Denies     Merchant navy officer (For Healthcare) Does Patient Have a Medical Advance Directive?: No Would patient like information on creating a medical advance directive?: No - Patient declined          Disposition:  Disposition Initial Assessment Completed for this Encounter: Yes  This service was provided via telemedicine using a 2-way, interactive audio and video technology.  Names of all persons participating in this telemedicine service and their role in this encounter. Name: KGURKYHC Role: NP  Name:  Role:   Name:  Role:   Name:  Role:     Emmit Pomfret 08/05/2018 6:12 PM

## 2018-08-05 NOTE — ED Notes (Signed)
Nurse navigator answered call light. Given urinal to patient stated the need to urinate. Asked if wanted to call a family member due to visitor policy. Did not want to call anyone at this time.

## 2018-08-05 NOTE — ED Notes (Signed)
Patient verbalizes understanding of discharge instructions. Opportunity for questioning and answers were provided. Armband removed by staff, pt discharged from ED.  

## 2018-08-07 ENCOUNTER — Emergency Department (HOSPITAL_COMMUNITY): Payer: Self-pay

## 2018-08-07 ENCOUNTER — Encounter (HOSPITAL_COMMUNITY): Payer: Self-pay

## 2018-08-07 ENCOUNTER — Emergency Department (HOSPITAL_COMMUNITY)
Admission: EM | Admit: 2018-08-07 | Discharge: 2018-08-11 | Disposition: A | Discharge status: Home / Self Care | Payer: Self-pay | Attending: Emergency Medicine | Admitting: Emergency Medicine

## 2018-08-07 ENCOUNTER — Other Ambulatory Visit: Payer: Self-pay

## 2018-08-07 DIAGNOSIS — Z1159 Encounter for screening for other viral diseases: Secondary | ICD-10-CM | POA: Insufficient documentation

## 2018-08-07 DIAGNOSIS — R440 Auditory hallucinations: Secondary | ICD-10-CM

## 2018-08-07 DIAGNOSIS — R441 Visual hallucinations: Secondary | ICD-10-CM

## 2018-08-07 DIAGNOSIS — Z20828 Contact with and (suspected) exposure to other viral communicable diseases: Secondary | ICD-10-CM | POA: Insufficient documentation

## 2018-08-07 DIAGNOSIS — Z008 Encounter for other general examination: Secondary | ICD-10-CM

## 2018-08-07 DIAGNOSIS — F17210 Nicotine dependence, cigarettes, uncomplicated: Secondary | ICD-10-CM | POA: Insufficient documentation

## 2018-08-07 DIAGNOSIS — F250 Schizoaffective disorder, bipolar type: Secondary | ICD-10-CM | POA: Insufficient documentation

## 2018-08-07 HISTORY — DX: Paranoid schizophrenia: F20.0

## 2018-08-07 LAB — RAPID URINE DRUG SCREEN, HOSP PERFORMED
Amphetamines: NOT DETECTED
Barbiturates: NOT DETECTED
Benzodiazepines: NOT DETECTED
Cocaine: NOT DETECTED
Opiates: NOT DETECTED
Tetrahydrocannabinol: POSITIVE — AB

## 2018-08-07 LAB — COMPREHENSIVE METABOLIC PANEL
ALT: 63 U/L — ABNORMAL HIGH (ref 0–44)
AST: 28 U/L (ref 15–41)
Albumin: 4.9 g/dL (ref 3.5–5.0)
Alkaline Phosphatase: 110 U/L (ref 38–126)
Anion gap: 11 (ref 5–15)
BUN: 8 mg/dL (ref 6–20)
CO2: 24 mmol/L (ref 22–32)
Calcium: 10.2 mg/dL (ref 8.9–10.3)
Chloride: 102 mmol/L (ref 98–111)
Creatinine, Ser: 0.87 mg/dL (ref 0.61–1.24)
GFR calc Af Amer: >60 mL/min (ref >60)
GFR calc non Af Amer: >60 mL/min (ref >60)
Glucose, Bld: 109 mg/dL — ABNORMAL HIGH (ref 70–99)
Potassium: 4 mmol/L (ref 3.5–5.1)
Sodium: 137 mmol/L (ref 135–145)
Total Bilirubin: 0.8 mg/dL (ref 0.3–1.2)
Total Protein: 7.7 g/dL (ref 6.5–8.1)

## 2018-08-07 LAB — CBC WITH DIFFERENTIAL/PLATELET
Abs Immature Granulocytes: 0.02 10*3/uL (ref 0.00–0.07)
Basophils Absolute: 0 10*3/uL (ref 0.0–0.1)
Basophils Relative: 0 %
Eosinophils Absolute: 0 10*3/uL (ref 0.0–0.5)
Eosinophils Relative: 0 %
HCT: 45.2 % (ref 39.0–52.0)
Hemoglobin: 14.9 g/dL (ref 13.0–17.0)
Immature Granulocytes: 0 %
Lymphocytes Relative: 30 %
Lymphs Abs: 1.7 10*3/uL (ref 0.7–4.0)
MCH: 28.5 pg (ref 26.0–34.0)
MCHC: 33 g/dL (ref 30.0–36.0)
MCV: 86.6 fL (ref 80.0–100.0)
Monocytes Absolute: 0.4 10*3/uL (ref 0.1–1.0)
Monocytes Relative: 8 %
Neutro Abs: 3.5 10*3/uL (ref 1.7–7.7)
Neutrophils Relative %: 62 %
Platelets: 164 10*3/uL (ref 150–400)
RBC: 5.22 MIL/uL (ref 4.22–5.81)
RDW: 13.9 % (ref 11.5–15.5)
WBC: 5.6 10*3/uL (ref 4.0–10.5)
nRBC: 0 % (ref 0.0–0.2)

## 2018-08-07 LAB — ACETAMINOPHEN LEVEL: Acetaminophen (Tylenol), Serum: <10 ug/mL — ABNORMAL LOW (ref 10–30)

## 2018-08-07 LAB — ETHANOL: Alcohol, Ethyl (B): <10 mg/dL (ref <10)

## 2018-08-07 LAB — PHOSPHORUS: Phosphorus: 3.1 mg/dL (ref 2.5–4.6)

## 2018-08-07 LAB — RAPID HIV SCREEN (HIV 1/2 AB+AG)
HIV 1/2 Antibodies: NONREACTIVE
HIV-1 P24 Antigen - HIV24: NONREACTIVE

## 2018-08-07 LAB — TROPONIN I: Troponin I: <0.03 ng/mL (ref <0.03)

## 2018-08-07 LAB — MAGNESIUM: Magnesium: 1.8 mg/dL (ref 1.7–2.4)

## 2018-08-07 LAB — SARS CORONAVIRUS 2 BY RT PCR (HOSPITAL ORDER, PERFORMED IN ~~LOC~~ HOSPITAL LAB): SARS Coronavirus 2: NEGATIVE

## 2018-08-07 LAB — SALICYLATE LEVEL: Salicylate Lvl: <7 mg/dL (ref 2.8–30.0)

## 2018-08-07 MED ORDER — RISPERIDONE 3 MG PO TABS
3.0000 mg | ORAL_TABLET | Freq: Two times a day (BID) | ORAL | Status: DC
  Administered 2018-08-07 – 2018-08-11 (×8): 3 mg via ORAL
  Filled 2018-08-07 (×8): qty 1

## 2018-08-07 MED ORDER — BENZTROPINE MESYLATE 1 MG PO TABS
1.0000 mg | ORAL_TABLET | Freq: Two times a day (BID) | ORAL | Status: DC
  Administered 2018-08-07 – 2018-08-11 (×8): 1 mg via ORAL
  Filled 2018-08-07 (×8): qty 1

## 2018-08-07 MED ORDER — PROPRANOLOL HCL 40 MG PO TABS
20.0000 mg | ORAL_TABLET | Freq: Two times a day (BID) | ORAL | Status: DC
  Administered 2018-08-07 – 2018-08-11 (×8): 20 mg via ORAL
  Filled 2018-08-07 (×8): qty 1

## 2018-08-07 MED ORDER — OMEGA-3-ACID ETHYL ESTERS 1 G PO CAPS
1.0000 g | ORAL_CAPSULE | Freq: Two times a day (BID) | ORAL | Status: DC
  Administered 2018-08-07 – 2018-08-11 (×8): 1 g via ORAL
  Filled 2018-08-07 (×8): qty 1

## 2018-08-07 MED ORDER — ADULT MULTIVITAMIN W/MINERALS CH
1.0000 | ORAL_TABLET | Freq: Every day | ORAL | Status: DC
  Administered 2018-08-08 – 2018-08-11 (×4): 1 via ORAL
  Filled 2018-08-07 (×4): qty 1

## 2018-08-07 MED ORDER — ACETAMINOPHEN 325 MG PO TABS
650.0000 mg | ORAL_TABLET | Freq: Once | ORAL | Status: AC
  Administered 2018-08-07: 650 mg via ORAL
  Filled 2018-08-07: qty 2

## 2018-08-07 MED ORDER — ARIPIPRAZOLE ER 400 MG IM SRER: 400.0000 mg | INTRAMUSCULAR | Status: DC

## 2018-08-07 MED ORDER — TEMAZEPAM 15 MG PO CAPS
30.0000 mg | ORAL_CAPSULE | Freq: Every day | ORAL | Status: DC
  Administered 2018-08-07 – 2018-08-10 (×4): 30 mg via ORAL
  Filled 2018-08-07 (×4): qty 2

## 2018-08-07 NOTE — Progress Notes (Signed)
Patient meets criteria for inpatient treatment. No appropriate or available beds at [facility]. CSW faxed referrals to the following facilities for review:  [facility]  [facility]  [facility]  [facility]  [facility]  [facility]  [facility]  [facility]  [facility]  [facility]  [facility]  [facility]  [facility]  [facility]  [facility]  [facility]  [facility]  [facility]  [facility]  [facility]  [facility]   TTS will continue to seek bed placement.  Vilma Meckel. Algis Greenhouse, MSW, LCSW Clinical Social Work/Disposition Phone: [phone] Fax: [phone]

## 2018-08-07 NOTE — ED Notes (Signed)
Dinner tray ordered.
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
## 2018-08-07 NOTE — ED Notes (Signed)
Sitter ordered d/t previous history and not taking medications.

## 2018-08-07 NOTE — ED Notes (Signed)
TTS complete 

## 2018-08-07 NOTE — ED Notes (Signed)
Pt is resting comfortably. Sprite at bedside

## 2018-08-07 NOTE — ED Provider Notes (Addendum)
[facility] EMERGENCY DEPARTMENT Provider Note   CSN: [id number] Arrival date & time: 08/07/18  1207    History   Chief Complaint Chief Complaint  Patient presents with  . Psychiatric Evaluation    HPI Jeremy Taylor is a 18 y.o. male presenting for hallucinations.  Level 5 caveat due to psychiatric illness.  Patient states that he is here today because he is having hallucinations.  He says he is seeing "dots and spots," and hearing voices.  At first, patient states these are new, but then states they have been going on for several years since the "thing" was placed in his head.  He cannot tell me what was placed in his head.  Patient states he does not feel threatened by what he is hearing or seeing.  He states when he was on medication, he was not hearing voices, but he is not currently on medication.  Patient also told EMS he needs to be evaluated because he "does not feel right."  When asked to clarify, he states it feels like he is out of his body.  He denies fevers, chills, sore throat, shortness of breath, nausea, vomiting, abdominal pain, urinary symptoms, normal bowel movements.  Patient states he has had a cough for several months, no change recently.   Patient states he is HIV positive, however per chart review there is no lab work confirming HIV status.  He told the nurse earlier this month that he was HIV positive, but no one else has it been documented or stated.  He does not appear to be on any medication for this.  Patient states he is not taking HIV medication currently. Patient states he lives at home with his girlfriend and children.  No one else at home has been sick.  He denies tobacco, alcohol, or drug use. Pt denies SI or HI.  Additional history obtained from chart review, patient with a history of paranoid schizophrenia and delusional disorder, and ?HTN  Per his last [facility] note on 07/13/2018, patient is supposed to be taking Abilify, Cogentin,  propanolol, risperidone, and Restoril.      HPI  Past Medical History:  Diagnosis Date  . Paranoid schizophrenia [facility])     Patient Active Problem List   Diagnosis Date Noted  . Paranoid schizophrenia (HCC)   . Delusional disorder (HCC) 07/13/2018    History reviewed. No pertinent surgical history.      Home Medications    Prior to Admission medications   Medication Sig Start Date End Date Taking? Authorizing Provider  ARIPiprazole ER (ABILIFY MAINTENA) 400 MG SRER injection Inject 2 mLs (400 mg total) into the muscle every 28 (twenty-eight) days. Due 6/8 Patient not taking: Reported on 08/07/2018 08/13/18   Malvin Johns, MD  benztropine (COGENTIN) 1 MG tablet Take 1 tablet (1 mg total) by mouth 2 (two) times daily. Patient not taking: Reported on 08/07/2018 07/19/18   Malvin Johns, MD  Multiple Vitamin (MULTIVITAMIN WITH MINERALS) TABS tablet Take 1 tablet by mouth daily. Patient not taking: Reported on 08/07/2018 07/20/18   Malvin Johns, MD  omega-3 acid ethyl esters (LOVAZA) 1 g capsule Take 1 capsule (1 g total) by mouth 2 (two) times daily. Patient not taking: Reported on 08/07/2018 07/19/18   Malvin Johns, MD  propranolol (INDERAL) 20 MG tablet Take 1 tablet (20 mg total) by mouth 2 (two) times daily. Patient not taking: Reported on 08/07/2018 07/19/18   Malvin Johns, MD  risperiDONE (RISPERDAL) 3 MG tablet 1  in am 2 at hs Patient not taking: Reported on 08/07/2018 08/05/18 08/12/18  Vallery Ridge, MD  temazepam (RESTORIL) 30 MG capsule Take 1 capsule (30 mg total) by mouth at bedtime. Patient not taking: Reported on 08/07/2018 07/19/18   Malvin Johns, MD    Family History History reviewed. No pertinent family history.  Social History Social History   Tobacco Use  . Smoking status: Current Every Day Smoker    Packs/day: 1.00    Types: Cigarettes  . Smokeless tobacco: Never Used  Substance Use Topics  . Alcohol use: No  . Drug use: No     Allergies   Patient has no  known allergies.   Review of Systems Review of Systems  Psychiatric/Behavioral: Positive for hallucinations.  All other systems reviewed and are negative.    Physical Exam Updated Vital Signs BP 130/82   Pulse (!) 104   Temp 98.9 F (37.2 C) (Oral)   Resp 16   SpO2 98%   Physical Exam Vitals signs and nursing note reviewed.  Constitutional:      General: He is not in acute distress.    Appearance: He is well-developed.     Comments: Appears nontoxic  HENT:     Head: Normocephalic and atraumatic.  Eyes:     Conjunctiva/sclera: Conjunctivae normal.     Pupils: Pupils are equal, round, and reactive to light.  Neck:     Musculoskeletal: Normal range of motion and neck supple.  Cardiovascular:     Rate and Rhythm: Regular rhythm. Tachycardia present.     Pulses: Normal pulses.     Comments: Mildly tachycardic around 110 Pulmonary:     Effort: Pulmonary effort is normal. No respiratory distress.     Breath sounds: Normal breath sounds. No wheezing.     Comments: Speaking in full sentences. Clear lung sounds in all fields.  Abdominal:     General: Bowel sounds are normal. There is no distension.     Palpations: Abdomen is soft. There is no mass.     Tenderness: There is no abdominal tenderness. There is no guarding or rebound.  Musculoskeletal: Normal range of motion.  Skin:    General: Skin is warm and dry.     Capillary Refill: Capillary refill takes less than 2 seconds.  Neurological:     Mental Status: He is alert and oriented to person, place, and time.  Psychiatric:        Attention and Perception: He perceives auditory hallucinations.        Thought Content: Thought content does not include homicidal or suicidal ideation. Thought content does not include homicidal or suicidal plan.     Comments: Calm and cooperative.       ED Treatments / Results  Labs (all labs ordered are listed, but only abnormal results are displayed) Labs Reviewed  COMPREHENSIVE  METABOLIC PANEL - Abnormal; Notable for the following components:      Result Value   Glucose, Bld 109 (*)    ALT 63 (*)    All other components within normal limits  RAPID URINE DRUG SCREEN, HOSP PERFORMED - Abnormal; Notable for the following components:   Tetrahydrocannabinol POSITIVE (*)    All other components within normal limits  ACETAMINOPHEN LEVEL - Abnormal; Notable for the following components:   Acetaminophen (Tylenol), Serum <10 (*)    All other components within normal limits  SARS CORONAVIRUS 2 (HOSPITAL ORDER, PERFORMED IN [facility])  CBC WITH DIFFERENTIAL/PLATELET  RAPID HIV  SCREEN (HIV 1/2 AB+AG)  ETHANOL  SALICYLATE LEVEL  TROPONIN I  MAGNESIUM  PHOSPHORUS  T-HELPER CELLS (CD4) COUNT (NOT AT [facility])    EKG EKG Interpretation  Date/Time:  Saturday Aug 07 2018 12:31:49 EDT Ventricular Rate:  110 PR Interval:  132 QRS Duration: 86 QT Interval:  316 QTC Calculation: 427 R Axis:   97 Text Interpretation:  Sinus tachycardia Right atrial enlargement Rightward axis prior tracings extensive early repolarization, similar to old except inferior T wave biphasic. Confirmed by Arby Barrette [phone]) on 08/07/2018 2:02:46 PM   Radiology Dg Thoracic Spine 2 View  Result Date: 08/05/2018 CLINICAL DATA:  Thoracolumbar pain for 3 weeks EXAM: THORACIC SPINE 2 VIEWS COMPARISON:  03/09/2015 FINDINGS: There is no evidence of thoracic spine fracture. Alignment is normal. No other significant bone abnormalities are identified. IMPRESSION: Negative. Electronically Signed   By: Elige Ko   On: 08/05/2018 16:42   Dg Lumbar Spine 2-3 Views  Result Date: 08/05/2018 CLINICAL DATA:  Thoracolumbar pain for 3 weeks EXAM: LUMBAR SPINE - 2-3 VIEW COMPARISON:  03/09/2015 FINDINGS: There is no evidence of lumbar spine fracture. Alignment is normal. Intervertebral disc spaces are maintained. IMPRESSION: Negative. Electronically Signed   By: Elige Ko   On: 08/05/2018 16:42    Dg Chest Portable 1 View  Result Date: 08/07/2018 CLINICAL DATA:  Cough EXAM: PORTABLE CHEST 1 VIEW COMPARISON:  07/11/2018 chest radiograph. FINDINGS: Stable cardiomediastinal silhouette with normal heart size. No pneumothorax. No pleural effusion. Lungs appear clear, with no acute consolidative airspace disease and no pulmonary edema. IMPRESSION: No active disease. Electronically Signed   By: Delbert Phenix M.D.   On: 08/07/2018 13:41    Procedures Procedures (including critical care time)  Medications Ordered in ED Medications  ARIPiprazole ER (ABILIFY MAINTENA) injection 400 mg (has no administration in time range)  benztropine (COGENTIN) tablet 1 mg (has no administration in time range)  multivitamin with minerals tablet 1 tablet (has no administration in time range)  omega-3 acid ethyl esters (LOVAZA) capsule 1 g (has no administration in time range)  propranolol (INDERAL) tablet 20 mg (has no administration in time range)  risperiDONE (RISPERDAL) tablet 3 mg (has no administration in time range)  temazepam (RESTORIL) capsule 30 mg (has no administration in time range)     Initial Impression / Assessment and Plan / ED Course  I have reviewed the triage vital signs and the nursing notes.  Pertinent labs & imaging results that were available during my care of the patient were reviewed by me and considered in my medical decision making (see chart for details).        Patient presenting for evaluation of auditory and visual hallucinations.  Patient is convinced that he has something implanted in his brain, and continually states that he is positive for HIV.  Per chart review, there is no diagnosis of HIV, however will obtain lab work.  Patient states he did not taking any of his home medications.  HIV test negative, patient informed.  EKG without significant changes from previous, but is abnormal.  As such, will obtain Trope, mag, phosphorus.  Discussed with attending, Dr. Clarice Pole  reviewed the ekg ad is agreeable to plan.   Labs reassuring.  Heart rate improving without intervention.  Patient informed RN that he wanted to leave.  I discussed with patient that I do not feel comfortable with him leaving until he is evaluated by psych.  Patient requesting a CT to assess the object that has been  implanted in his mind.  As this delusion has persisted, I do not feel comfortable with discharge home without psychiatric evaluation.  Patient is agreeable to stay under voluntary commitment.  At this time, patient is medically cleared for TTS evaluation.  [facility]    Behavioral health evaluated the patient, patient meets inpatient criteria.  TTS looking for bed placement.  Final Clinical Impressions(s) / ED Diagnoses   Final diagnoses:  Hallucinations, visual  Auditory hallucination    ED Discharge Orders    None       Alveria ApleyCaccavale, Lash Matulich, PA-C 08/07/18 1557    Alveria ApleyCaccavale, Jonanthony Nahar, PA-C 08/07/18 1700    Arby BarrettePfeiffer, Marcy, MD 08/17/18 2011

## 2018-08-07 NOTE — ED Triage Notes (Signed)
To room via EMS.  Pt walked to fire station and told them he needs to go to hospital because "he doesn't feel right", hearing voices that "tell him to run", seeing "dots and spots".  Pt not taking medications, and doesn't know how how long it has been.  Pt c/o head pain.

## 2018-08-07 NOTE — ED Notes (Signed)
TTS being done 

## 2018-08-08 MED ORDER — LORAZEPAM 1 MG PO TABS
2.0000 mg | ORAL_TABLET | Freq: Once | ORAL | Status: AC
  Administered 2018-08-08: 12:00:00 2 mg via ORAL
  Filled 2018-08-08: qty 2

## 2018-08-08 MED ORDER — ACETAMINOPHEN 325 MG PO TABS
650.0000 mg | ORAL_TABLET | Freq: Once | ORAL | Status: AC
  Administered 2018-08-08: 650 mg via ORAL
  Filled 2018-08-08: qty 2

## 2018-08-08 NOTE — ED Triage Notes (Signed)
TTS done in Pt room.

## 2018-08-08 NOTE — ED Triage Notes (Signed)
Pt was observed running from his room down the hall to [facility] zone . Pt then  Ran out emergency exit door and along out side hall from [employer]  . Pt was then running up stairs  And EMS bay . Once at top of stairs Pt stopped running and walked to lt on the side walk.

## 2018-08-08 NOTE — ED Provider Notes (Signed)
  Physical Exam  BP 131/78 (BP Location: Right Arm)   Pulse 70   Temp 98 F (36.7 C) (Oral)   Resp 16   SpO2 100%   Physical Exam Constitutional:      Appearance: He is well-developed. He is not toxic-appearing.  HENT:     Head: Normocephalic.     Nose: Nose normal.  Eyes:     General: Lids are normal.     Conjunctiva/sclera: Conjunctivae normal.  Neck:     Musculoskeletal: Full passive range of motion without pain.  Cardiovascular:     Rate and Rhythm: Normal rate.  Pulmonary:     Effort: Pulmonary effort is normal.  Musculoskeletal: Normal range of motion.     Comments: Patient seen running down the hall and out the EMS entrance, security behind him   Skin:    General: Skin is warm and dry.     Capillary Refill: Capillary refill takes less than 2 seconds.  Neurological:     Mental Status: He is alert and oriented to person, place, and time.     Comments: Ambulatory back to room. Speech is normal. Spontaneously moves all four extremities in coordinated fashion   Psychiatric:        Mood and Affect: Affect is labile.        Speech: Speech is delayed.        Thought Content: Thought content is delusional.        Judgment: Judgment is impulsive and inappropriate.     Comments: Patient brought back to room after he ran out of the ER.  Laying in bed. Alert, awake. Can tell me his name. When asked what happened he stated "I was just trippin".       ED Course/Procedures     Procedures  MDM   1115: Patient presented to ED for hallucinations, delusions on 5/30. Medically cleared yesterday and evaluated by TTS who recommends inpatient treatment. Patient seen running out of the ER ambulance bay up the stairs into parking lot.  Security successfully escorted patient back to his room.  There was no significant need for force to redirect him.  No injuries noted after patient was brought back to his room.  I evaluated patient immediately after this.  He was cooperative, oriented to  self and place.  Could not really tell me exactly why he tried to run but stated "I am trippin".  Explained to him that we plan for psychiatry to help him figure out the right meds for him and that is what we are waiting on.  Patient verbalized understanding.  Sitter and security outside room.  IVC paperwork filled out by me/EDP. I dont see need for chemical restraints right now he is redirectable and cooperative again. Chart, labs VS reviewed. Remarkable for +THC otherwise normal. COVID is negative. VSS. Will renew VS now.       Liberty Handy, PA-C 08/08/18 1122    Derwood Kaplan, MD 08/09/18 1910

## 2018-08-08 NOTE — ED Notes (Signed)
Pt eating lunch
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
## 2018-08-08 NOTE — ED Notes (Signed)
Called for IVC paper to be served

## 2018-08-08 NOTE — Progress Notes (Signed)
CSW contacted referral facilities with the following results:  Still reviewing: [facility] (no bed today) [facility] (no bed today) [facility] (resent per request) [facility] (waitlist) [facility]  Declined: [facility] (no beds) [facility] (denied) [facility] (at capacity)  TTS will continue to seek bed placement.  Vilma Meckel. Algis Greenhouse, MSW, LCSW Clinical Social Work/Disposition Phone: [phone] Fax: [phone]

## 2018-08-08 NOTE — ED Triage Notes (Signed)
Pt was returned to his room  with his cooperation . No injury pt resting in bed.

## 2018-08-09 MED ORDER — LORAZEPAM 1 MG PO TABS
1.0000 mg | ORAL_TABLET | Freq: Once | ORAL | Status: DC
  Filled 2018-08-09: qty 1

## 2018-08-09 MED ORDER — NICOTINE 21 MG/24HR TD PT24
21.0000 mg | MEDICATED_PATCH | Freq: Every day | TRANSDERMAL | Status: DC
  Administered 2018-08-09 – 2018-08-11 (×3): 21 mg via TRANSDERMAL
  Filled 2018-08-09 (×3): qty 1

## 2018-08-09 MED ORDER — LORAZEPAM 1 MG PO TABS
1.0000 mg | ORAL_TABLET | Freq: Once | ORAL | Status: AC
  Administered 2018-08-09: 1 mg via ORAL

## 2018-08-09 MED ORDER — LORAZEPAM 1 MG PO TABS: 2.0000 mg | ORAL_TABLET | Freq: Once | ORAL | Status: DC

## 2018-08-09 MED ORDER — ACETAMINOPHEN 325 MG PO TABS
650.0000 mg | ORAL_TABLET | Freq: Four times a day (QID) | ORAL | Status: AC | PRN
  Administered 2018-08-09 – 2018-08-10 (×4): 650 mg via ORAL
  Filled 2018-08-09 (×4): qty 2

## 2018-08-09 MED ORDER — DIPHENHYDRAMINE HCL 25 MG PO CAPS
50.0000 mg | ORAL_CAPSULE | Freq: Once | ORAL | Status: AC
  Administered 2018-08-09: 50 mg via ORAL
  Filled 2018-08-09: qty 2

## 2018-08-09 NOTE — ED Notes (Signed)
Pt displaying pacing behaviors, ambulating to and from bathroom frequently and frequently asking when he is leaving. Discussed PO medication for anxiety with pt, pt is agreeable to taking at this time.
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
## 2018-08-10 NOTE — ED Provider Notes (Signed)
Emergency Medicine Observation Re-evaluation Note  Jeremy Taylor is a 18 y.o. male, seen on rounds today.  Pt initially presented to the ED for complaints of Psychiatric Evaluation Currently, the patient is laying comfortably in bed watching TV  Physical Exam  BP (!) 109/51 (BP Location: Right Arm)   Pulse 61   Temp 98.4 F (36.9 C) (Oral)   Resp 17   SpO2 100%  Physical Exam Vitals signs and nursing note reviewed.  Constitutional:      General: He is not in acute distress. Neurological:     Mental Status: He is alert.  Psychiatric:        Mood and Affect: Affect is blunt and flat.        Behavior: Behavior is withdrawn.     ED Course / MDM  EKG:EKG Interpretation  Date/Time:  Saturday Aug 07 2018 12:31:49 EDT Ventricular Rate:  110 PR Interval:  132 QRS Duration: 86 QT Interval:  316 QTC Calculation: 427 R Axis:   97 Text Interpretation:  Sinus tachycardia Right atrial enlargement Rightward axis prior tracings extensive early repolarization, similar to old except inferior T wave biphasic. Confirmed by Arby Barrette [phone]) on 08/07/2018 2:02:46 PM    I have reviewed the labs performed to date as well as medications administered while in observation.  Recent changes in the last 24 hours include yesterday he became anxious and was reportedly pacing.  I placed orders for p.o. medications which he took willingly after which he calmed down. Plan  Current plan is for inpatient placement. Patient is under full IVC at this time.   Norman Clay 08/10/18 1030    Vanetta Mulders, MD 08/11/18 [phone]

## 2018-08-10 NOTE — ED Notes (Signed)
Pt eating lunch tray and tolerating well

## 2018-08-10 NOTE — ED Notes (Signed)
Pt provided snack after medication administration

## 2018-08-10 NOTE — ED Notes (Signed)
Pt eatring supper tray and tolerating well.

## 2018-08-10 NOTE — ED Notes (Signed)
Dinner tray ordered.

## 2018-08-10 NOTE — [facility] Counselor (Signed)
  REASSESSMENT  Pt reassessed for safety and stability.  Pt was observed sitting on the side of the bed, alert and oriented x2.  Pt denies SI/HI.  Pt denies A/V-hallucinations.  Pt appeared paranoid throughout the assessment as evident of his periodic scans of the room.  When as asked if someone was in the room with him.  Pt denies anyone being in the room.  Pt continue to meet inpatient criteria.  TTS will continue to look for placement.  Felicidad Sugarman L. Carle Dargan, MS, [facility], [facility] Therapeutic Triage Specialist  [phone]

## 2018-08-11 DIAGNOSIS — F22 Delusional disorders: Secondary | ICD-10-CM

## 2018-08-11 DIAGNOSIS — R440 Auditory hallucinations: Secondary | ICD-10-CM

## 2018-08-11 DIAGNOSIS — F600 Paranoid personality disorder: Secondary | ICD-10-CM

## 2018-08-11 MED ORDER — ACETAMINOPHEN 500 MG PO TABS
1000.0000 mg | ORAL_TABLET | Freq: Once | ORAL | Status: AC
  Administered 2018-08-11: 1000 mg via ORAL
  Filled 2018-08-11: qty 2

## 2018-08-11 MED ORDER — IBUPROFEN 400 MG PO TABS
400.0000 mg | ORAL_TABLET | Freq: Once | ORAL | Status: AC | PRN
  Administered 2018-08-11: 400 mg via ORAL
  Filled 2018-08-11: qty 1

## 2018-08-11 NOTE — Consult Note (Signed)
Telepsych Consultation   Reason for Consult:  Delusional and paranoid state  Referring Physician:  EDP  Location of Patient:  [facility] [phone] Location of Provider: [facility]  Patient Identification: Jeremy Taylor MRN:  [id number] Principal Diagnosis: <principal problem not specified> Diagnosis:  Active Problems:   * No active hospital problems. *   Total Time spent with patient: 20 minutes  Subjective: " I feel better now. I was hearing voices but they are better."  HPI:  Jeremy Taylor is a 18 y.o. male patient requiring psychiatric consultation after he was taking to the ED for delusional and paranoid state. Patient initial endorsed he was delusional and hearing voices telling him to hurt himself and to punch others. During this evaluation, he is calm, appropriate and cooperative. He continues to endorse that he hears voices although they have improved and they are not command. He does not appear to be internally preoccupied. He denies feeling paranoid. He denies having thoughts of wanting to harm himself or others. He has a history of delusional disorder and paranoid schizophrenia and admits that he had not taken his medication because they were not picked up from his pharmacy.  He also reports that he had not followed-up with [facility] for therapy/medication management. He was encouraged to do both. Because is denies the above and is showing improvement he is being psychiatrically cleared with recommendations as noted below.     Past Psychiatric History: delusional disorder and paranoid schizophrenia   Risk to Self: Suicidal Ideation: Yes-Currently Present Suicidal Intent: No Is patient at risk for suicide?: No Suicidal Plan?: No Access to Means: No What has been your use of drugs/alcohol within the last 12 months?: (daily marijuana use) How many times?: 0 Other Self Harm Risks: none Triggers for Past Attempts: None known Intentional Self Injurious  Behavior: None Risk to Others: Homicidal Ideation: No Thoughts of Harm to Others: No Current Homicidal Intent: No Current Homicidal Plan: No Access to Homicidal Means: No Identified Victim: (none) History of harm to others?: No Assessment of Violence: None Noted Violent Behavior Description: (none) Does patient have access to weapons?: No Criminal Charges Pending?: No Does patient have a court date: No Prior Inpatient Therapy: Prior Inpatient Therapy: Yes Prior Therapy Dates: 07/2018 Prior Therapy Facilty/Provider(s): [facility] Reason for Treatment: Schizoaffective Prior Outpatient Therapy: Prior Outpatient Therapy: No(was referred to [facility], did not f/u) Does patient have an ACCT team?: No Does patient have Intensive In-House Services?  : No Does patient have [facility] services? : No Does patient have [facility] services?: No  Past Medical History:  Past Medical History:  Diagnosis Date  . Paranoid schizophrenia (HCC)    History reviewed. No pertinent surgical history. Family History: History reviewed. No pertinent family history. Family Psychiatric  History: None noted in chart.  Social History:  Social History   Substance and Sexual Activity  Alcohol Use No     Social History   Substance and Sexual Activity  Drug Use No    Social History   Socioeconomic History  . Marital status: Single    Spouse name: Not on file  . Number of children: Not on file  . Years of education: Not on file  . Highest education level: Not on file  Occupational History  . Not on file  Social Needs  . Financial resource strain: Not on file  . Food insecurity:    Worry: Not on file    Inability: Not on file  . Transportation needs:  Medical: Not on file    Non-medical: Not on file  Tobacco Use  . Smoking status: Current Every Day Smoker    Packs/day: 1.00    Types: Cigarettes  . Smokeless tobacco: Never Used  Substance and Sexual Activity  . Alcohol use: No  . Drug use: No  . Sexual  activity: Not on file  Lifestyle  . Physical activity:    Days per week: Not on file    Minutes per session: Not on file  . Stress: Not on file  Relationships  . Social connections:    Talks on phone: Not on file    Gets together: Not on file    Attends religious service: Not on file    Active member of club or organization: Not on file    Attends meetings of clubs or organizations: Not on file    Relationship status: Not on file  Other Topics Concern  . Not on file  Social History Narrative  . Not on file   Additional Social History:    Allergies:  No Known Allergies  Labs: No results found for this or any previous visit (from the past 48 hour(s)).  Medications:  Current Facility-Administered Medications  Medication Dose Route Frequency Provider Last Rate Last Dose  . acetaminophen (TYLENOL) tablet 1,000 mg  1,000 mg Oral Once Arlyn DunningMcLean, Kelly A, PA-C      . [START ON 08/13/2018] ARIPiprazole ER (ABILIFY MAINTENA) injection 400 mg  400 mg Intramuscular Q28 days Caccavale, Sophia, PA-C      . benztropine (COGENTIN) tablet 1 mg  1 mg Oral BID Caccavale, Sophia, PA-C   1 mg at 08/11/18 0954  . multivitamin with minerals tablet 1 tablet  1 tablet Oral Daily Caccavale, Sophia, PA-C   1 tablet at 08/11/18 0953  . nicotine (NICODERM CQ - dosed in mg/24 hours) patch 21 mg  21 mg Transdermal Daily Cristina GongHammond, Elizabeth W, PA-C   21 mg at 08/11/18 [id number]  . omega-3 acid ethyl esters (LOVAZA) capsule 1 g  1 g Oral BID Caccavale, Sophia, PA-C   1 g at 08/11/18 0954  . propranolol (INDERAL) tablet 20 mg  20 mg Oral BID Caccavale, Sophia, PA-C   20 mg at 08/11/18 0953  . risperiDONE (RISPERDAL) tablet 3 mg  3 mg Oral BID Caccavale, Sophia, PA-C   3 mg at 08/11/18 0953  . temazepam (RESTORIL) capsule 30 mg  30 mg Oral QHS Caccavale, Sophia, PA-C   30 mg at 08/10/18 2217   Current Outpatient Medications  Medication Sig Dispense Refill  . [START ON 08/13/2018] ARIPiprazole ER (ABILIFY MAINTENA) 400 MG  SRER injection Inject 2 mLs (400 mg total) into the muscle every 28 (twenty-eight) days. Due 6/8 (Patient not taking: Reported on 08/07/2018) 1 each 11  . benztropine (COGENTIN) 1 MG tablet Take 1 tablet (1 mg total) by mouth 2 (two) times daily. (Patient not taking: Reported on 08/07/2018) 60 tablet 1  . Multiple Vitamin (MULTIVITAMIN WITH MINERALS) TABS tablet Take 1 tablet by mouth daily. (Patient not taking: Reported on 08/07/2018) 90 tablet 1  . omega-3 acid ethyl esters (LOVAZA) 1 g capsule Take 1 capsule (1 g total) by mouth 2 (two) times daily. (Patient not taking: Reported on 08/07/2018) 60 capsule 2  . propranolol (INDERAL) 20 MG tablet Take 1 tablet (20 mg total) by mouth 2 (two) times daily. (Patient not taking: Reported on 08/07/2018) 60 tablet 3  . risperiDONE (RISPERDAL) 3 MG tablet 1 in am 2 at hs (  Patient not taking: Reported on 08/07/2018) 21 tablet 2  . temazepam (RESTORIL) 30 MG capsule Take 1 capsule (30 mg total) by mouth at bedtime. (Patient not taking: Reported on 08/07/2018) 30 capsule 0    Musculoskeletal: Unable to assess as evaluation via telepsych  Psychiatric Specialty Exam: Physical Exam  Nursing note reviewed. Constitutional: He is oriented to person, place, and time.  Neurological: He is alert and oriented to person, place, and time.    Review of Systems  Psychiatric/Behavioral: Positive for hallucinations. Negative for depression, memory loss, substance abuse and suicidal ideas. The patient is not nervous/anxious and does not have insomnia.   All other systems reviewed and are negative.   Blood pressure 119/60, pulse (!) 54, temperature 97.8 F (36.6 C), temperature source Oral, resp. rate 14, SpO2 99 %.There is no height or weight on file to calculate BMI.  General Appearance: Fairly Groomed  Eye Contact:  Fair  Speech:  Clear and Coherent and Normal Rate  Volume:  Normal  Mood:  Anxious  Affect:  Congruent  Thought Process:  Coherent, Goal Directed, Linear  and Descriptions of Associations: Intact  Orientation:  Full (Time, Place, and Person)  Thought Content:  Hallucinations: Auditory  Suicidal Thoughts:  No  Homicidal Thoughts:  No  Memory:  Immediate;   Fair Recent;   Fair  Judgement:  Fair  Insight:  Fair  Psychomotor Activity:  Normal  Concentration:  Concentration: Fair and Attention Span: Fair  Recall:  [employer] of Knowledge:  Fair  Language:  Good  Akathisia:  Negative  Handed:  Right  AIMS (if indicated):     Assets:  Architect Resilience Social Support  ADL's:  Intact  Cognition:  WNL  Sleep:        Treatment Plan Summary: Daily contact with patient to assess and evaluate symptoms and progress in treatment  Disposition: No evidence of imminent risk to self or others at present.    Patient is psychiatrically cleared. Recommended to call [facility] to schedule an appointment and take take all medications as previously prescribed. Recommendation was discussed with patient.    EDP will be made aware of disposition and recommendation.   This service was provided via telemedicine using a 2-way, interactive audio and video technology.  Names of all persons participating in this telemedicine service and their role in this encounter. Name: Denzil Magnuson  Role: FNP-C  Name: Jeremy Taylor Role: Patient    Denzil Magnuson, NP 08/11/2018 10:22 AM

## 2018-08-11 NOTE — ED Provider Notes (Signed)
  Physical Exam  BP 125/67 (BP Location: Left Arm)   Pulse (!) 49   Temp 98.6 F (37 C) (Oral)   Resp 17   SpO2 100%   Physical Exam  ED Course/Procedures     Procedures  MDM  Patient cleared by psych this morning.  Patient was under IVC and in particular IVC was recommended to be rescinded by psych NP who saw the patient this morning.  Patient is supposed to follow-up in [facility] outpatient.  Stable for discharge this morning.   Charlynne Pander, MD 08/11/18 1228

## 2018-08-11 NOTE — Discharge Instructions (Addendum)
Continue your current meds   Follow up at [facility]  Return to ER if you have thoughts of harming yourself or others, worse hallucinations

## 2018-08-11 NOTE — ED Notes (Signed)
Patient verbalizes understanding of discharge instructions . Opportunity for questions and answers were provided . Armband removed by staff ,Pt discharged from ED. W/C  offered at D/C  and Declined W/C at D/C and was escorted to lobby by RN.  

## 2018-08-11 NOTE — ED Notes (Signed)
Breakfast ordered 

## 2019-09-29 ENCOUNTER — Ambulatory Visit: Payer: Self-pay | Admitting: Physician Assistant

## 2019-09-29 ENCOUNTER — Other Ambulatory Visit: Payer: Self-pay

## 2019-09-29 VITALS — BP 136/86 | HR 98 | Temp 98.2°F | Resp 18

## 2019-09-29 DIAGNOSIS — Z1159 Encounter for screening for other viral diseases: Secondary | ICD-10-CM

## 2019-09-29 DIAGNOSIS — F200 Paranoid schizophrenia: Secondary | ICD-10-CM

## 2019-09-29 DIAGNOSIS — M5441 Lumbago with sciatica, right side: Secondary | ICD-10-CM

## 2019-09-29 DIAGNOSIS — Z79899 Other long term (current) drug therapy: Secondary | ICD-10-CM

## 2019-09-29 DIAGNOSIS — Z9189 Other specified personal risk factors, not elsewhere classified: Secondary | ICD-10-CM

## 2019-09-29 DIAGNOSIS — M5442 Lumbago with sciatica, left side: Secondary | ICD-10-CM

## 2019-09-29 DIAGNOSIS — R519 Headache, unspecified: Secondary | ICD-10-CM

## 2019-09-29 DIAGNOSIS — G8929 Other chronic pain: Secondary | ICD-10-CM

## 2019-09-29 MED ORDER — ARIPIPRAZOLE 20 MG PO TABS: 20.0000 mg | ORAL_TABLET | Freq: Every day | ORAL | 1 refills | Status: DC

## 2019-09-29 MED ORDER — IBUPROFEN 600 MG PO TABS: 600.0000 mg | ORAL_TABLET | Freq: Three times a day (TID) | ORAL | 0 refills | Status: DC | PRN

## 2019-09-29 NOTE — Patient Instructions (Signed)
Your medication will be available for [facility] and [facility]  Please return to the mobile unit next week for fasting labs.  We will work to get you an appointment to establish primary care as well as financial assistance.  Roney Jaffe, PA-C Physician Assistant [facility] [email or web address]
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
## 2019-09-30 MED FILL — IBUPROFEN 600 MG TABLET: 600 | 10 days supply | Qty: 30 | Fill #0

## 2019-09-30 MED FILL — ARIPIPRAZOLE 20 MG TABS: 20 | 30 days supply | Qty: 30 | Fill #0

## 2019-10-06 ENCOUNTER — Ambulatory Visit: Payer: Medicaid Other | Admitting: *Deleted

## 2019-10-06 ENCOUNTER — Other Ambulatory Visit: Payer: Self-pay

## 2019-10-06 ENCOUNTER — Ambulatory Visit: Payer: Medicaid Other | Admitting: Physician Assistant

## 2019-10-06 ENCOUNTER — Ambulatory Visit: Payer: Medicaid Other | Attending: Critical Care Medicine

## 2019-10-06 VITALS — BP 136/81 | HR 83 | Temp 98.7°F | Ht 71.0 in | Wt 161.0 lb

## 2019-10-06 DIAGNOSIS — Z1159 Encounter for screening for other viral diseases: Secondary | ICD-10-CM | POA: Diagnosis not present

## 2019-10-06 DIAGNOSIS — G8929 Other chronic pain: Secondary | ICD-10-CM

## 2019-10-06 DIAGNOSIS — Z23 Encounter for immunization: Secondary | ICD-10-CM

## 2019-10-06 DIAGNOSIS — R519 Headache, unspecified: Secondary | ICD-10-CM | POA: Diagnosis not present

## 2019-10-06 DIAGNOSIS — F200 Paranoid schizophrenia: Secondary | ICD-10-CM | POA: Diagnosis not present

## 2019-10-06 DIAGNOSIS — Z79899 Other long term (current) drug therapy: Secondary | ICD-10-CM

## 2019-10-06 DIAGNOSIS — Z9189 Other specified personal risk factors, not elsewhere classified: Secondary | ICD-10-CM

## 2019-10-06 MED ORDER — AMITRIPTYLINE HCL 10 MG PO TABS: 10.0000 mg | ORAL_TABLET | Freq: Every day | ORAL | 0 refills | Status: DC

## 2019-10-06 MED ORDER — MELOXICAM 15 MG PO TABS: 15.0000 mg | ORAL_TABLET | Freq: Every day | ORAL | 0 refills | Status: DC

## 2019-10-06 NOTE — Patient Instructions (Addendum)
For your headaches and body pains, I recommend that you take Mobic 15 mg once a day, and begin amitriptyline 10 mg at bedtime.  I recommend that you try Voltaren pain cream over-the-counter as well.  Follow-up at the mobile unit in 2 weeks  We will contact you with your lab results  Roney Jaffe, PA-C Physician Assistant [facility] [email or web address]   Amitriptyline tablets What is this medicine? AMITRIPTYLINE (a mee TRIP ti leen) is used to treat depression. This medicine may be used for other purposes; ask your health care provider or pharmacist if you have questions. COMMON BRAND NAME(S): Elavil, Vanatrip What should I tell my health care provider before I take this medicine? They need to know if you have any of these conditions:  an alcohol problem  asthma, difficulty breathing  bipolar disorder or schizophrenia  difficulty passing urine, prostate trouble  glaucoma  heart disease or previous heart attack  liver disease  over active thyroid  seizures  thoughts or plans of suicide, a previous suicide attempt, or family history of suicide attempt  an unusual or allergic reaction to amitriptyline, other medicines, foods, dyes, or preservatives  pregnant or trying to get pregnant  breast-feeding How should I use this medicine? Take this medicine by mouth with a drink of water. Follow the directions on the prescription label. You can take the tablets with or without food. Take your medicine at regular intervals. Do not take it more often than directed. Do not stop taking this medicine suddenly except upon the advice of your doctor. Stopping this medicine too quickly may cause serious side effects or your condition may worsen. A special MedGuide will be given to you by the pharmacist with each prescription and refill. Be sure to read this information carefully each time. Talk to your pediatrician regarding the use  of this medicine in children. Special care may be needed. Overdosage: If you think you have taken too much of this medicine contact a poison control center or emergency room at once. NOTE: This medicine is only for you. Do not share this medicine with others. What if I miss a dose? If you miss a dose, take it as soon as you can. If it is almost time for your next dose, take only that dose. Do not take double or extra doses. What may interact with this medicine? Do not take this medicine with any of the following medications:  arsenic trioxide  certain medicines used to regulate abnormal heartbeat or to treat other heart conditions  cisapride  droperidol  halofantrine  linezolid  MAOIs like Carbex, Eldepryl, Marplan, Nardil, and Parnate  methylene blue  other medicines for mental depression  phenothiazines like perphenazine, thioridazine and chlorpromazine  pimozide  probucol  procarbazine  sparfloxacin  St. John's Wort This medicine may also interact with the following medications:  atropine and related drugs like hyoscyamine, scopolamine, tolterodine and others  barbiturate medicines for inducing sleep or treating seizures, like phenobarbital  cimetidine  disulfiram  ethchlorvynol  thyroid hormones such as levothyroxine  ziprasidone This list may not describe all possible interactions. Give your health care provider a list of all the medicines, herbs, non-prescription drugs, or dietary supplements you use. Also tell them if you smoke, drink alcohol, or use illegal drugs. Some items may interact with your medicine. What should I watch for while using this medicine? Tell your doctor if your symptoms do not get better or if they get worse. Visit your doctor or health  care professional for regular checks on your progress. Because it may take several weeks to see the full effects of this medicine, it is important to continue your treatment as prescribed by your  doctor. Patients and their families should watch out for new or worsening thoughts of suicide or depression. Also watch out for sudden changes in feelings such as feeling anxious, agitated, panicky, irritable, hostile, aggressive, impulsive, severely restless, overly excited and hyperactive, or not being able to sleep. If this happens, especially at the beginning of treatment or after a change in dose, call your health care professional. Bonita Quin may get drowsy or dizzy. Do not drive, use machinery, or do anything that needs mental alertness until you know how this medicine affects you. Do not stand or sit up quickly, especially if you are an older patient. This reduces the risk of dizzy or fainting spells. Alcohol may interfere with the effect of this medicine. Avoid alcoholic drinks. Do not treat yourself for coughs, colds, or allergies without asking your doctor or health care professional for advice. Some ingredients can increase possible side effects. Your mouth may get dry. Chewing sugarless gum or sucking hard candy, and drinking plenty of water will help. Contact your doctor if the problem does not go away or is severe. This medicine may cause dry eyes and blurred vision. If you wear contact lenses you may feel some discomfort. Lubricating drops may help. See your eye doctor if the problem does not go away or is severe. This medicine can cause constipation. Try to have a bowel movement at least every 2 to 3 days. If you do not have a bowel movement for 3 days, call your doctor or health care professional. This medicine can make you more sensitive to the sun. Keep out of the sun. If you cannot avoid being in the sun, wear protective clothing and use sunscreen. Do not use sun lamps or tanning beds/booths. What side effects may I notice from receiving this medicine? Side effects that you should report to your doctor or health care professional as soon as possible:  allergic reactions like skin rash,  itching or hives, swelling of the face, lips, or tongue  anxious  breathing problems  changes in vision  confusion  elevated mood, decreased need for sleep, racing thoughts, impulsive behavior  eye pain  fast, irregular heartbeat  feeling faint or lightheaded, falls  feeling agitated, angry, or irritable  fever with increased sweating  hallucination, loss of contact with reality  seizures  stiff muscles  suicidal thoughts or other mood changes  tingling, pain, or numbness in the feet or hands  trouble passing urine or change in the amount of urine  trouble sleeping  unusually weak or tired  vomiting  yellowing of the eyes or skin Side effects that usually do not require medical attention (report to your doctor or health care professional if they continue or are bothersome):  change in sex drive or performance  change in appetite or weight  constipation  dizziness  dry mouth  nausea  tired  tremors  upset stomach This list may not describe all possible side effects. Call your doctor for medical advice about side effects. You may report side effects to FDA at 1-800-FDA-1088. Where should I keep my medicine? Keep out of the reach of children. Store at room temperature between 20 and 25 degrees C (68 and 77 degrees F). Throw away any unused medicine after the expiration date. NOTE: This sheet is a summary. It  may not cover all possible information. If you have questions about this medicine, talk to your doctor, pharmacist, or health care provider.  2020 Elsevier/Gold Standard (2018-02-16 13:04:32)

## 2019-10-06 NOTE — Progress Notes (Signed)
Established Patient Office Visit  Subjective:  Patient ID: Jeremy Taylor, male    DOB: 12/24/2000  Age: 19 y.o. MRN: [id number]  CC:  Chief Complaint  Patient presents with  . Headache    HPI Jeremy Taylor was seen on the mobile unit one week ago and restarted on his beavioral health meds.  States that he did restart the Abilify, states that the hallucinations have resolved.  Denies any adverse effects from the Abilify.  Reports he does have appointment at family services on August 26 to help manage his behavioral health.  Reports that the ibuprofen 600 did not offer any relief from his chronic headaches.  Continues to have back and leg pain on a daily basis as well.  Will receive his first Covid vaccine today     Past Medical History:  Diagnosis Date  . Paranoid schizophrenia (HCC)     History reviewed. No pertinent surgical history.  History reviewed. No pertinent family history.  Social History   Socioeconomic History  . Marital status: Single    Spouse name: Not on file  . Number of children: Not on file  . Years of education: Not on file  . Highest education level: Not on file  Occupational History  . Not on file  Tobacco Use  . Smoking status: Current Every Day Smoker    Packs/day: 1.00    Types: Cigarettes  . Smokeless tobacco: Never Used  Vaping Use  . Vaping Use: Never used  Substance and Sexual Activity  . Alcohol use: No  . Drug use: No  . Sexual activity: Not on file  Other Topics Concern  . Not on file  Social History Narrative  . Not on file   Social Determinants of Health   Financial Resource Strain:   . Difficulty of Paying Living Expenses:   Food Insecurity:   . Worried About Programme researcher, broadcasting/film/videounning Out of Food in the Last Year:   . Baristaan Out of Food in the Last Year:   Transportation Needs:   . Freight forwarderLack of Transportation (Medical):   Marland Kitchen. Lack of Transportation (Non-Medical):   Physical Activity:   . Days of Exercise per Week:   . Minutes of  Exercise per Session:   Stress:   . Feeling of Stress :   Social Connections:   . Frequency of Communication with Friends and Family:   . Frequency of Social Gatherings with Friends and Family:   . Attends Religious Services:   . Active Member of Clubs or Organizations:   . Attends BankerClub or Organization Meetings:   Marland Kitchen. Marital Status:   Intimate Partner Violence:   . Fear of Current or Ex-Partner:   . Emotionally Abused:   Marland Kitchen. Physically Abused:   . Sexually Abused:     Outpatient Medications Prior to Visit  Medication Sig Dispense Refill  . ARIPiprazole (ABILIFY) 20 MG tablet Take 1 tablet (20 mg total) by mouth daily. 90 tablet 1  . ibuprofen (ADVIL) 600 MG tablet Take 1 tablet (600 mg total) by mouth every 8 (eight) hours as needed. 30 tablet 0  . Multiple Vitamin (MULTIVITAMIN WITH MINERALS) TABS tablet Take 1 tablet by mouth daily. 90 tablet 1  . omega-3 acid ethyl esters (LOVAZA) 1 g capsule Take 1 capsule (1 g total) by mouth 2 (two) times daily. 60 capsule 2  . benztropine (COGENTIN) 1 MG tablet Take 1 tablet (1 mg total) by mouth 2 (two) times daily. 60 tablet 1  . propranolol (INDERAL)  20 MG tablet Take 1 tablet (20 mg total) by mouth 2 (two) times daily. 60 tablet 3  . risperiDONE (RISPERDAL) 3 MG tablet 1 in am 2 at hs (Patient not taking: Reported on 08/07/2018) 21 tablet 2  . temazepam (RESTORIL) 30 MG capsule Take 1 capsule (30 mg total) by mouth at bedtime. 30 capsule 0   No facility-administered medications prior to visit.    No Known Allergies  ROS Review of Systems  Constitutional: Negative.   HENT: Negative.   Eyes: Negative.  Negative for photophobia and visual disturbance.  Respiratory: Negative.   Cardiovascular: Negative.   Gastrointestinal: Negative.   Genitourinary: Negative.   Musculoskeletal: Positive for arthralgias and back pain.  Skin: Negative.   Allergic/Immunologic: Negative.   Neurological: Positive for headaches. Negative for dizziness,  seizures and weakness.  Hematological: Negative.   Psychiatric/Behavioral: Negative for dysphoric mood, hallucinations, self-injury, sleep disturbance and suicidal ideas. The patient is not nervous/anxious.       Objective:    Physical Exam Vitals and nursing note reviewed.  Constitutional:      Appearance: He is well-developed.  HENT:     Head: Normocephalic and atraumatic.     Mouth/Throat:     Mouth: Mucous membranes are moist.     Pharynx: Oropharynx is clear.  Eyes:     General: No visual field deficit.    Extraocular Movements: Extraocular movements intact.     Right eye: Normal extraocular motion.     Left eye: Normal extraocular motion.     Pupils: Pupils are equal, round, and reactive to light.  Cardiovascular:     Rate and Rhythm: Normal rate and regular rhythm.     Heart sounds: Normal heart sounds.  Pulmonary:     Effort: Pulmonary effort is normal.     Breath sounds: Normal breath sounds.  Abdominal:     General: Bowel sounds are normal.     Palpations: Abdomen is soft.  Musculoskeletal:        General: Normal range of motion.     Cervical back: Normal range of motion and neck supple.  Skin:    General: Skin is warm and dry.  Neurological:     Mental Status: He is alert and oriented to person, place, and time.     Cranial Nerves: No facial asymmetry.  Psychiatric:        Attention and Perception: Attention normal.        Mood and Affect: Affect is flat.        Speech: Speech normal.        Behavior: Behavior normal. Behavior is cooperative.        Thought Content: Thought content normal.        Cognition and Memory: Cognition and memory normal.        Judgment: Judgment normal.     BP (!) 136/81 (BP Location: Left Arm, Patient Position: Sitting, Cuff Size: Normal)   Pulse 83   Temp 98.7 F (37.1 C) (Oral)   Ht 5\' 11"  (1.803 m)   Wt 161 lb (73 kg)   BMI 22.45 kg/m  Wt Readings from Last 3 Encounters:  10/06/19 161 lb (73 kg)  08/05/18 132 lb  (59.9 kg)  07/02/16 140 lb (63.5 kg) (35 %, Z= -0.39)*   * Growth percentiles are based on CDC (Boys, 2-20 Years) data.     Health Maintenance Due  Topic Date Due  . Hepatitis C Screening  Never done  . HIV Screening  Never done  . TETANUS/TDAP  Never done    There are no preventive care reminders to display for this patient.  No results found for: TSH Lab Results  Component Value Date   WBC 5.6 08/07/2018   HGB 14.9 08/07/2018   HCT 45.2 08/07/2018   MCV 86.6 08/07/2018   PLT 164 08/07/2018   Lab Results  Component Value Date   NA 137 08/07/2018   K 4.0 08/07/2018   CO2 24 08/07/2018   GLUCOSE 109 (H) 08/07/2018   BUN 8 08/07/2018   CREATININE 0.87 08/07/2018   BILITOT 0.8 08/07/2018   ALKPHOS 110 08/07/2018   AST 28 08/07/2018   ALT 63 (H) 08/07/2018   PROT 7.7 08/07/2018   ALBUMIN 4.9 08/07/2018   CALCIUM 10.2 08/07/2018   ANIONGAP 11 08/07/2018   No results found for: CHOL No results found for: HDL No results found for: LDLCALC No results found for: TRIG No results found for: CHOLHDL No results found for: QQPY1P    Assessment & Plan:   Problem List Items Addressed This Visit      Other   Paranoid schizophrenia (HCC)    Other Visit Diagnoses    Chronic left-sided headaches    -  Primary   Relevant Medications   amitriptyline (ELAVIL) 10 MG tablet   meloxicam (MOBIC) 15 MG tablet   Encounter for HCV screening test for high risk patient       Long term current use of antipsychotic medication        1. Encounter for HCV screening test for high risk patient  - HCV Ab w/Rflx to Verification  2. Paranoid schizophrenia (HCC)  - B12 and Folate Panel - Vitamin D, 25-hydroxy - TSH - Comp. Metabolic Panel (12)  3. Long term current use of antipsychotic medication  - Lipid panel  4. Chronic left-sided headaches Patient is stable on Abilify, will do low-dose amitriptyline, red flags given to patient to observe for any change in mood.  Trial Mobic  15 mg, encouraged Voltaren cream over-the-counter.   - amitriptyline (ELAVIL) 10 MG tablet; Take 1 tablet (10 mg total) by mouth at bedtime.  Dispense: 30 tablet; Refill: 0 - meloxicam (MOBIC) 15 MG tablet; Take 1 tablet (15 mg total) by mouth daily.  Dispense: 30 tablet; Refill: 0  Patient had fasting labs completed today, patient to return to mobile unit in 2 weeks for follow-up of chronic headaches, in 4 weeks for second Covid vaccine, encouraged to keep appointment with family services on August 26 to continue behavioral health medicine management, appointment to establish primary care on December 19, 2019 with Dr. Earlene Plater at [facility].  Meds ordered this encounter  Medications  . amitriptyline (ELAVIL) 10 MG tablet    Sig: Take 1 tablet (10 mg total) by mouth at bedtime.    Dispense:  30 tablet    Refill:  0    Order Specific Question:   Supervising Provider    Answer:   Delford Field, PATRICK E [[id number]]  . meloxicam (MOBIC) 15 MG tablet    Sig: Take 1 tablet (15 mg total) by mouth daily.    Dispense:  30 tablet    Refill:  0    Order Specific Question:   Supervising Provider    Answer:   Storm Frisk [[id number]]    I have reviewed the patient's medical history (PMH, PSH, Social History, Family History, Medications, and allergies) , and have been updated if relevant. I spent 20  minutes reviewing chart and  face to face time with patient.     Follow-up: Return in about 2 months (around 12/19/2019) for To establish PCP, with Dr. Earlene Plater at [facility].    Kasandra Knudsen Mayers, PA-C

## 2019-10-07 LAB — COMP. METABOLIC PANEL (12)
AST: 17 IU/L (ref 0–40)
Albumin/Globulin Ratio: 2.4 — ABNORMAL HIGH (ref 1.2–2.2)
Albumin: 4.7 g/dL (ref 4.1–5.2)
Alkaline Phosphatase: 110 IU/L (ref 48–121)
BUN/Creatinine Ratio: 11 (ref 9–20)
BUN: 11 mg/dL (ref 6–20)
Bilirubin Total: <0.2 mg/dL (ref 0.0–1.2)
Calcium: 9.6 mg/dL (ref 8.7–10.2)
Chloride: 105 mmol/L (ref 96–106)
Creatinine, Ser: 1.01 mg/dL (ref 0.76–1.27)
GFR calc Af Amer: 122 mL/min/{1.73_m2} (ref >59)
GFR calc non Af Amer: 106 mL/min/{1.73_m2} (ref >59)
Globulin, Total: 2 g/dL (ref 1.5–4.5)
Glucose: 125 mg/dL — ABNORMAL HIGH (ref 65–99)
Potassium: 4.4 mmol/L (ref 3.5–5.2)
Sodium: 143 mmol/L (ref 134–144)
Total Protein: 6.7 g/dL (ref 6.0–8.5)

## 2019-10-07 LAB — VITAMIN D 25 HYDROXY (VIT D DEFICIENCY, FRACTURES): Vit D, 25-Hydroxy: 23.1 ng/mL — ABNORMAL LOW (ref 30.0–100.0)

## 2019-10-07 LAB — LIPID PANEL
Chol/HDL Ratio: 4.3 ratio (ref 0.0–5.0)
Cholesterol, Total: 153 mg/dL (ref 100–199)
HDL: 36 mg/dL — ABNORMAL LOW (ref >39)
LDL Chol Calc (NIH): 97 mg/dL (ref 0–99)
Triglycerides: 107 mg/dL (ref 0–149)
VLDL Cholesterol Cal: 20 mg/dL (ref 5–40)

## 2019-10-07 LAB — HCV AB W/RFLX TO VERIFICATION: HCV Ab: <0.1 s/co ratio (ref 0.0–0.9)

## 2019-10-07 LAB — TSH: TSH: 0.831 u[IU]/mL (ref 0.450–4.500)

## 2019-10-07 LAB — HCV INTERPRETATION

## 2019-10-07 LAB — B12 AND FOLATE PANEL
Folate: 11.3 ng/mL (ref >3.0)
Vitamin B-12: 270 pg/mL (ref 232–1245)

## 2019-10-11 NOTE — Progress Notes (Signed)
   Covid-19 Vaccination Clinic  Name:  Jeremy Taylor    MRN: [id number] DOB: November 18, 2000  11/15/2019  [name] was observed post Covid-19 immunization for 15 MINUTES without incident. He was provided with Vaccine Information Sheet and instruction to access the V-Safe system.   [name] was instructed to call 911 with any severe reactions post vaccine: Difficulty breathing  Swelling of face and throat  A fast heartbeat  A bad rash all over body  Dizziness and weakness   Immunizations Administered     Name Date Dose VIS Date Route   Moderna COVID-19 Vaccine 10/06/2019 -- -- --   Manufacturer: Gala Murdoch   Lot: [id number]   NDC: 32355-732-20   Moderna COVID-19 Vaccine 10/06/2019 10:00 AM 0.5 mL 02/2019 Intramuscular   Manufacturer: Moderna   Lot: [id number]   NDC: 23762-831-51

## 2019-10-17 ENCOUNTER — Telehealth: Payer: Self-pay | Admitting: *Deleted

## 2019-10-17 NOTE — Telephone Encounter (Signed)
MA unable to reach patient. Voicemail states to give a call back to [place] with [facility] at [phone].

## 2019-10-17 NOTE — Telephone Encounter (Signed)
-----   Message from Roney Jaffe, [place] sent at 10/10/2019  9:39 AM EDT ----- Please call patient and let him know that his vit d was low, he needs to take 2,000 units OTC once daily. Thanks!

## 2019-11-09 NOTE — Progress Notes (Signed)
Patient tolerated injection well today 

## 2019-11-15 ENCOUNTER — Ambulatory Visit: Payer: Self-pay

## 2019-12-19 ENCOUNTER — Telehealth: Payer: Self-pay | Admitting: Internal Medicine

## 2019-12-19 DIAGNOSIS — F200 Paranoid schizophrenia: Secondary | ICD-10-CM

## 2019-12-19 DIAGNOSIS — Z7689 Persons encountering health services in other specified circumstances: Secondary | ICD-10-CM

## 2020-02-10 ENCOUNTER — Emergency Department (HOSPITAL_COMMUNITY): Payer: Medicaid Other

## 2020-02-10 ENCOUNTER — Inpatient Hospital Stay (HOSPITAL_COMMUNITY)
Admission: EM | Admit: 2020-02-10 | Discharge: 2020-02-15 | DRG: 958 | Disposition: A | Discharge status: Home / Self Care | Payer: Medicaid Other | Attending: General Surgery | Admitting: General Surgery

## 2020-02-10 ENCOUNTER — Other Ambulatory Visit: Payer: Self-pay

## 2020-02-10 ENCOUNTER — Encounter (HOSPITAL_COMMUNITY): Payer: Self-pay

## 2020-02-10 DIAGNOSIS — S36039A Unspecified laceration of spleen, initial encounter: Secondary | ICD-10-CM

## 2020-02-10 DIAGNOSIS — T1490XA Injury, unspecified, initial encounter: Secondary | ICD-10-CM

## 2020-02-10 DIAGNOSIS — S27322A Contusion of lung, bilateral, initial encounter: Secondary | ICD-10-CM

## 2020-02-10 DIAGNOSIS — S02602A Fracture of unspecified part of body of left mandible, initial encounter for closed fracture: Secondary | ICD-10-CM

## 2020-02-10 DIAGNOSIS — F17210 Nicotine dependence, cigarettes, uncomplicated: Secondary | ICD-10-CM | POA: Diagnosis present

## 2020-02-10 DIAGNOSIS — S0219XA Other fracture of base of skull, initial encounter for closed fracture: Secondary | ICD-10-CM | POA: Diagnosis present

## 2020-02-10 DIAGNOSIS — S0266XA Fracture of symphysis of mandible, initial encounter for closed fracture: Secondary | ICD-10-CM | POA: Diagnosis present

## 2020-02-10 DIAGNOSIS — R109 Unspecified abdominal pain: Secondary | ICD-10-CM | POA: Diagnosis present

## 2020-02-10 DIAGNOSIS — M549 Dorsalgia, unspecified: Secondary | ICD-10-CM | POA: Diagnosis present

## 2020-02-10 DIAGNOSIS — Y92410 Unspecified street and highway as the place of occurrence of the external cause: Secondary | ICD-10-CM

## 2020-02-10 DIAGNOSIS — M264 Malocclusion, unspecified: Secondary | ICD-10-CM | POA: Diagnosis present

## 2020-02-10 DIAGNOSIS — D62 Acute posthemorrhagic anemia: Secondary | ICD-10-CM | POA: Diagnosis not present

## 2020-02-10 DIAGNOSIS — F430 Acute stress reaction: Secondary | ICD-10-CM | POA: Diagnosis present

## 2020-02-10 DIAGNOSIS — S36032A Major laceration of spleen, initial encounter: Principal | ICD-10-CM | POA: Diagnosis present

## 2020-02-10 DIAGNOSIS — M2624 Reverse articulation: Secondary | ICD-10-CM | POA: Diagnosis present

## 2020-02-10 DIAGNOSIS — Z79899 Other long term (current) drug therapy: Secondary | ICD-10-CM

## 2020-02-10 DIAGNOSIS — R4182 Altered mental status, unspecified: Secondary | ICD-10-CM | POA: Diagnosis present

## 2020-02-10 DIAGNOSIS — R519 Headache, unspecified: Secondary | ICD-10-CM | POA: Diagnosis not present

## 2020-02-10 DIAGNOSIS — R7401 Elevation of levels of liver transaminase levels: Secondary | ICD-10-CM | POA: Diagnosis present

## 2020-02-10 DIAGNOSIS — M79605 Pain in left leg: Secondary | ICD-10-CM | POA: Diagnosis present

## 2020-02-10 DIAGNOSIS — Z23 Encounter for immunization: Secondary | ICD-10-CM

## 2020-02-10 DIAGNOSIS — M79604 Pain in right leg: Secondary | ICD-10-CM | POA: Diagnosis present

## 2020-02-10 DIAGNOSIS — Z20822 Contact with and (suspected) exposure to covid-19: Secondary | ICD-10-CM | POA: Diagnosis present

## 2020-02-10 DIAGNOSIS — S0240FA Zygomatic fracture, left side, initial encounter for closed fracture: Secondary | ICD-10-CM | POA: Diagnosis present

## 2020-02-10 DIAGNOSIS — F209 Schizophrenia, unspecified: Secondary | ICD-10-CM | POA: Diagnosis present

## 2020-02-10 LAB — PROTIME-INR
INR: 1 (ref 0.8–1.2)
Prothrombin Time: 12.8 seconds (ref 11.4–15.2)

## 2020-02-10 LAB — CBC
HCT: 39.8 % (ref 39.0–52.0)
Hemoglobin: 12.6 g/dL — ABNORMAL LOW (ref 13.0–17.0)
MCH: 27.9 pg (ref 26.0–34.0)
MCHC: 31.7 g/dL (ref 30.0–36.0)
MCV: 88.1 fL (ref 80.0–100.0)
Platelets: 157 10*3/uL (ref 150–400)
RBC: 4.52 MIL/uL (ref 4.22–5.81)
RDW: 14.2 % (ref 11.5–15.5)
WBC: 7 10*3/uL (ref 4.0–10.5)
nRBC: 0 % (ref 0.0–0.2)

## 2020-02-10 MED ORDER — FENTANYL CITRATE (PF) 100 MCG/2ML IJ SOLN
50.0000 ug | Freq: Once | INTRAMUSCULAR | Status: AC
  Administered 2020-02-10: 50 ug via INTRAVENOUS
  Filled 2020-02-10: qty 2

## 2020-02-10 MED ORDER — TETANUS-DIPHTH-ACELL PERTUSSIS 5-2.5-18.5 LF-MCG/0.5 IM SUSY
0.5000 mL | PREFILLED_SYRINGE | Freq: Once | INTRAMUSCULAR | Status: AC
  Administered 2020-02-10: 0.5 mL via INTRAMUSCULAR
  Filled 2020-02-10: qty 0.5
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
## 2020-02-11 ENCOUNTER — Encounter (HOSPITAL_COMMUNITY): Payer: Self-pay | Admitting: Anesthesiology

## 2020-02-11 ENCOUNTER — Encounter (HOSPITAL_COMMUNITY): Payer: Self-pay

## 2020-02-11 DIAGNOSIS — T1490XA Injury, unspecified, initial encounter: Secondary | ICD-10-CM

## 2020-02-11 DIAGNOSIS — S02602A Fracture of unspecified part of body of left mandible, initial encounter for closed fracture: Secondary | ICD-10-CM

## 2020-02-11 DIAGNOSIS — S36039A Unspecified laceration of spleen, initial encounter: Secondary | ICD-10-CM

## 2020-02-11 DIAGNOSIS — Z79899 Other long term (current) drug therapy: Secondary | ICD-10-CM | POA: Diagnosis not present

## 2020-02-11 DIAGNOSIS — S0240FA Zygomatic fracture, left side, initial encounter for closed fracture: Secondary | ICD-10-CM | POA: Diagnosis present

## 2020-02-11 DIAGNOSIS — D62 Acute posthemorrhagic anemia: Secondary | ICD-10-CM | POA: Diagnosis not present

## 2020-02-11 DIAGNOSIS — F209 Schizophrenia, unspecified: Secondary | ICD-10-CM | POA: Diagnosis present

## 2020-02-11 DIAGNOSIS — S02622A Fracture of subcondylar process of left mandible, initial encounter for closed fracture: Secondary | ICD-10-CM | POA: Diagnosis not present

## 2020-02-11 DIAGNOSIS — R7401 Elevation of levels of liver transaminase levels: Secondary | ICD-10-CM | POA: Diagnosis present

## 2020-02-11 DIAGNOSIS — F17210 Nicotine dependence, cigarettes, uncomplicated: Secondary | ICD-10-CM | POA: Diagnosis present

## 2020-02-11 DIAGNOSIS — R4182 Altered mental status, unspecified: Secondary | ICD-10-CM | POA: Diagnosis present

## 2020-02-11 DIAGNOSIS — M79604 Pain in right leg: Secondary | ICD-10-CM | POA: Diagnosis present

## 2020-02-11 DIAGNOSIS — Y92410 Unspecified street and highway as the place of occurrence of the external cause: Secondary | ICD-10-CM | POA: Diagnosis not present

## 2020-02-11 DIAGNOSIS — S0266XA Fracture of symphysis of mandible, initial encounter for closed fracture: Secondary | ICD-10-CM | POA: Diagnosis present

## 2020-02-11 DIAGNOSIS — S0219XA Other fracture of base of skull, initial encounter for closed fracture: Secondary | ICD-10-CM | POA: Diagnosis present

## 2020-02-11 DIAGNOSIS — S36032A Major laceration of spleen, initial encounter: Secondary | ICD-10-CM | POA: Diagnosis present

## 2020-02-11 DIAGNOSIS — S27322A Contusion of lung, bilateral, initial encounter: Secondary | ICD-10-CM | POA: Diagnosis present

## 2020-02-11 DIAGNOSIS — S02620A Fracture of subcondylar process of mandible, unspecified side, initial encounter for closed fracture: Secondary | ICD-10-CM | POA: Diagnosis not present

## 2020-02-11 DIAGNOSIS — R109 Unspecified abdominal pain: Secondary | ICD-10-CM | POA: Diagnosis present

## 2020-02-11 DIAGNOSIS — F430 Acute stress reaction: Secondary | ICD-10-CM | POA: Diagnosis present

## 2020-02-11 DIAGNOSIS — Z23 Encounter for immunization: Secondary | ICD-10-CM | POA: Diagnosis not present

## 2020-02-11 DIAGNOSIS — M549 Dorsalgia, unspecified: Secondary | ICD-10-CM | POA: Diagnosis present

## 2020-02-11 DIAGNOSIS — M264 Malocclusion, unspecified: Secondary | ICD-10-CM | POA: Diagnosis present

## 2020-02-11 DIAGNOSIS — M2624 Reverse articulation: Secondary | ICD-10-CM | POA: Diagnosis present

## 2020-02-11 DIAGNOSIS — M79605 Pain in left leg: Secondary | ICD-10-CM | POA: Diagnosis present

## 2020-02-11 DIAGNOSIS — Z20822 Contact with and (suspected) exposure to covid-19: Secondary | ICD-10-CM | POA: Diagnosis present

## 2020-02-11 DIAGNOSIS — F411 Generalized anxiety disorder: Secondary | ICD-10-CM | POA: Diagnosis not present

## 2020-02-11 DIAGNOSIS — R519 Headache, unspecified: Secondary | ICD-10-CM | POA: Diagnosis not present

## 2020-02-11 LAB — COMPREHENSIVE METABOLIC PANEL
ALT: 59 U/L — ABNORMAL HIGH (ref 0–44)
ALT: 49 U/L — ABNORMAL HIGH (ref 0–44)
AST: 56 U/L — ABNORMAL HIGH (ref 15–41)
AST: 43 U/L — ABNORMAL HIGH (ref 15–41)
Albumin: 4.1 g/dL (ref 3.5–5.0)
Albumin: 3.5 g/dL (ref 3.5–5.0)
Alkaline Phosphatase: 97 U/L (ref 38–126)
Alkaline Phosphatase: 82 U/L (ref 38–126)
Anion gap: 11 (ref 5–15)
Anion gap: 10 (ref 5–15)
BUN: 9 mg/dL (ref 6–20)
BUN: 9 mg/dL (ref 6–20)
CO2: 24 mmol/L (ref 22–32)
CO2: 26 mmol/L (ref 22–32)
Calcium: 9.5 mg/dL (ref 8.9–10.3)
Calcium: 8.9 mg/dL (ref 8.9–10.3)
Chloride: 103 mmol/L (ref 98–111)
Chloride: 103 mmol/L (ref 98–111)
Creatinine, Ser: 1.21 mg/dL (ref 0.61–1.24)
Creatinine, Ser: 1.04 mg/dL (ref 0.61–1.24)
GFR, Estimated: 41 mL/min — ABNORMAL LOW (ref >60)
GFR, Estimated: >60 mL/min (ref >60)
Glucose, Bld: 123 mg/dL — ABNORMAL HIGH (ref 70–99)
Glucose, Bld: 120 mg/dL — ABNORMAL HIGH (ref 70–99)
Potassium: 4 mmol/L (ref 3.5–5.1)
Potassium: 3.7 mmol/L (ref 3.5–5.1)
Sodium: 138 mmol/L (ref 135–145)
Sodium: 139 mmol/L (ref 135–145)
Total Bilirubin: 0.8 mg/dL (ref 0.3–1.2)
Total Bilirubin: 0.9 mg/dL (ref 0.3–1.2)
Total Protein: 6.5 g/dL (ref 6.5–8.1)
Total Protein: 5.7 g/dL — ABNORMAL LOW (ref 6.5–8.1)

## 2020-02-11 LAB — ETHANOL: Alcohol, Ethyl (B): <10 mg/dL (ref <10)

## 2020-02-11 LAB — RESP PANEL BY RT-PCR (FLU A&B, COVID) ARPGX2
Influenza A by PCR: NEGATIVE
Influenza B by PCR: NEGATIVE
SARS Coronavirus 2 by RT PCR: NEGATIVE

## 2020-02-11 LAB — CBC
HCT: 32.8 % — ABNORMAL LOW (ref 39.0–52.0)
HCT: 31 % — ABNORMAL LOW (ref 39.0–52.0)
HCT: 31.4 % — ABNORMAL LOW (ref 39.0–52.0)
Hemoglobin: 10.5 g/dL — ABNORMAL LOW (ref 13.0–17.0)
Hemoglobin: 10.1 g/dL — ABNORMAL LOW (ref 13.0–17.0)
Hemoglobin: 9.9 g/dL — ABNORMAL LOW (ref 13.0–17.0)
MCH: 28.2 pg (ref 26.0–34.0)
MCH: 28.1 pg (ref 26.0–34.0)
MCH: 27.6 pg (ref 26.0–34.0)
MCHC: 32 g/dL (ref 30.0–36.0)
MCHC: 32.6 g/dL (ref 30.0–36.0)
MCHC: 31.5 g/dL (ref 30.0–36.0)
MCV: 87.9 fL (ref 80.0–100.0)
MCV: 86.1 fL (ref 80.0–100.0)
MCV: 87.5 fL (ref 80.0–100.0)
Platelets: 127 10*3/uL — ABNORMAL LOW (ref 150–400)
Platelets: 119 10*3/uL — ABNORMAL LOW (ref 150–400)
Platelets: 102 10*3/uL — ABNORMAL LOW (ref 150–400)
RBC: 3.73 MIL/uL — ABNORMAL LOW (ref 4.22–5.81)
RBC: 3.6 MIL/uL — ABNORMAL LOW (ref 4.22–5.81)
RBC: 3.59 MIL/uL — ABNORMAL LOW (ref 4.22–5.81)
RDW: 14.3 % (ref 11.5–15.5)
RDW: 14.2 % (ref 11.5–15.5)
RDW: 14.3 % (ref 11.5–15.5)
WBC: 12 10*3/uL — ABNORMAL HIGH (ref 4.0–10.5)
WBC: 8.5 10*3/uL (ref 4.0–10.5)
WBC: 5.6 10*3/uL (ref 4.0–10.5)
nRBC: 0 % (ref 0.0–0.2)
nRBC: 0 % (ref 0.0–0.2)
nRBC: 0 % (ref 0.0–0.2)

## 2020-02-11 LAB — URINALYSIS, ROUTINE W REFLEX MICROSCOPIC
Bacteria, UA: NONE SEEN
Bilirubin Urine: NEGATIVE
Glucose, UA: NEGATIVE mg/dL
Ketones, ur: NEGATIVE mg/dL
Leukocytes,Ua: NEGATIVE
Nitrite: NEGATIVE
Protein, ur: 100 mg/dL — AB
RBC / HPF: >50 RBC/hpf — ABNORMAL HIGH (ref 0–5)
Specific Gravity, Urine: >1.046 — ABNORMAL HIGH (ref 1.005–1.030)
pH: 7 (ref 5.0–8.0)

## 2020-02-11 LAB — I-STAT CHEM 8, ED
BUN: 11 mg/dL (ref 6–20)
Calcium, Ion: 1.13 mmol/L — ABNORMAL LOW (ref 1.15–1.40)
Chloride: 102 mmol/L (ref 98–111)
Creatinine, Ser: 1.1 mg/dL (ref 0.61–1.24)
Glucose, Bld: 118 mg/dL — ABNORMAL HIGH (ref 70–99)
HCT: 40 % (ref 39.0–52.0)
Hemoglobin: 13.6 g/dL (ref 13.0–17.0)
Potassium: 4 mmol/L (ref 3.5–5.1)
Sodium: 140 mmol/L (ref 135–145)
TCO2: 24 mmol/L (ref 22–32)

## 2020-02-11 LAB — MRSA PCR SCREENING: MRSA by PCR: NEGATIVE

## 2020-02-11 LAB — SAMPLE TO BLOOD BANK

## 2020-02-11 LAB — HIV ANTIBODY (ROUTINE TESTING W REFLEX): HIV Screen 4th Generation wRfx: NONREACTIVE

## 2020-02-11 MED ORDER — SODIUM CHLORIDE 0.9 % IV BOLUS
1000.0000 mL | Freq: Once | INTRAVENOUS | Status: AC
  Administered 2020-02-11: 1000 mL via INTRAVENOUS

## 2020-02-11 MED ORDER — DIPHENHYDRAMINE HCL 50 MG/ML IJ SOLN
12.5000 mg | Freq: Four times a day (QID) | INTRAMUSCULAR | Status: DC | PRN
  Administered 2020-02-11 – 2020-02-15 (×3): 25 mg via INTRAVENOUS
  Filled 2020-02-11 (×3): qty 1

## 2020-02-11 MED ORDER — OXYCODONE HCL 5 MG PO TABS
5.0000 mg | ORAL_TABLET | ORAL | Status: DC | PRN
  Administered 2020-02-11 – 2020-02-12 (×3): 10 mg via ORAL
  Filled 2020-02-11 (×3): qty 2

## 2020-02-11 MED ORDER — PANTOPRAZOLE SODIUM 40 MG IV SOLR
40.0000 mg | Freq: Every day | INTRAVENOUS | Status: DC
  Administered 2020-02-11: 40 mg via INTRAVENOUS
  Filled 2020-02-11: qty 40

## 2020-02-11 MED ORDER — HYDROMORPHONE HCL 1 MG/ML IJ SOLN: 0.5000 mg | Freq: Once | INTRAMUSCULAR | Status: DC

## 2020-02-11 MED ORDER — PANTOPRAZOLE SODIUM 40 MG PO TBEC: 40.0000 mg | DELAYED_RELEASE_TABLET | Freq: Every day | ORAL | Status: DC

## 2020-02-11 MED ORDER — MORPHINE SULFATE (PF) 2 MG/ML IV SOLN
1.0000 mg | INTRAVENOUS | Status: DC | PRN
  Administered 2020-02-11 – 2020-02-13 (×14): 2 mg via INTRAVENOUS
  Filled 2020-02-11 (×14): qty 1

## 2020-02-11 MED ORDER — METHOCARBAMOL 1000 MG/10ML IJ SOLN
1000.0000 mg | Freq: Three times a day (TID) | INTRAVENOUS | Status: DC
  Administered 2020-02-11 – 2020-02-15 (×13): 1000 mg via INTRAVENOUS
  Filled 2020-02-11 (×17): qty 10

## 2020-02-11 MED ORDER — ACETAMINOPHEN 500 MG PO TABS
1000.0000 mg | ORAL_TABLET | Freq: Four times a day (QID) | ORAL | Status: DC
  Administered 2020-02-11 (×3): 1000 mg via ORAL
  Filled 2020-02-11 (×3): qty 2

## 2020-02-11 MED ORDER — FENTANYL CITRATE (PF) 100 MCG/2ML IJ SOLN
50.0000 ug | Freq: Once | INTRAMUSCULAR | Status: AC
  Administered 2020-02-11: 50 ug via INTRAVENOUS
  Filled 2020-02-11: qty 2

## 2020-02-11 MED ORDER — KCL IN DEXTROSE-NACL 20-5-0.45 MEQ/L-%-% IV SOLN
INTRAVENOUS | Status: DC
  Administered 2020-02-14: 1000 mL via INTRAVENOUS
  Filled 2020-02-11 (×9): qty 1000

## 2020-02-11 MED ORDER — ONDANSETRON HCL 4 MG/2ML IJ SOLN: 4.0000 mg | Freq: Four times a day (QID) | INTRAMUSCULAR | Status: DC | PRN

## 2020-02-11 MED ORDER — WHITE PETROLATUM EX OINT
TOPICAL_OINTMENT | CUTANEOUS | Status: AC
  Filled 2020-02-11: qty 28.35

## 2020-02-11 MED ORDER — IOHEXOL 300 MG/ML  SOLN
100.0000 mL | Freq: Once | INTRAMUSCULAR | Status: AC | PRN
  Administered 2020-02-11: 100 mL via INTRAVENOUS

## 2020-02-11 MED ORDER — ONDANSETRON 4 MG PO TBDP: 4.0000 mg | ORAL_TABLET | Freq: Four times a day (QID) | ORAL | Status: DC | PRN

## 2020-02-11 MED ORDER — ACETAMINOPHEN 325 MG PO TABS: 650.0000 mg | ORAL_TABLET | Freq: Four times a day (QID) | ORAL | Status: DC

## 2020-02-11 MED ORDER — CHLORHEXIDINE GLUCONATE CLOTH 2 % EX PADS
6.0000 | MEDICATED_PAD | Freq: Every day | CUTANEOUS | Status: DC
  Administered 2020-02-13 – 2020-02-15 (×3): 6 via TOPICAL

## 2020-02-11 MED ORDER — FENTANYL CITRATE (PF) 100 MCG/2ML IJ SOLN
100.0000 ug | Freq: Once | INTRAMUSCULAR | Status: AC
  Administered 2020-02-11: 100 ug via INTRAVENOUS
  Filled 2020-02-11: qty 2

## 2020-02-11 MED ORDER — DOCUSATE SODIUM 100 MG PO CAPS
100.0000 mg | ORAL_CAPSULE | Freq: Two times a day (BID) | ORAL | Status: DC
  Administered 2020-02-11 (×2): 100 mg via ORAL
  Filled 2020-02-11: qty 1

## 2020-02-11 MED ORDER — FENTANYL CITRATE (PF) 100 MCG/2ML IJ SOLN: 50.0000 ug | Freq: Once | INTRAMUSCULAR | Status: DC

## 2020-02-11 MED ORDER — HYDROMORPHONE HCL 1 MG/ML IJ SOLN
1.0000 mg | Freq: Once | INTRAMUSCULAR | Status: AC
  Administered 2020-02-11: 1 mg via INTRAVENOUS
  Filled 2020-02-11: qty 1

## 2020-02-11 MED ORDER — OXYCODONE HCL 5 MG PO TABS
5.0000 mg | ORAL_TABLET | ORAL | Status: DC | PRN
  Administered 2020-02-11 (×2): 5 mg via ORAL
  Filled 2020-02-11 (×2): qty 1
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
## 2020-02-11 NOTE — Consult Note (Signed)
Patient seen but unable to participate in psychiatric evaluation/assessement at this time due to his inability to communicate as a result of severe jaw pain. Recommendation: Re-consult psychiatric service once patient is able to communicate.  Thedore Mins, MD Attending psychiatrist.

## 2020-02-11 NOTE — Progress Notes (Signed)
Discussed plan for timing of repair with anesthesiologist.  The trauma service and the anesthesia service both feel that ORIF with wiring of the jaw (MMF) can safely be performed tomorrow.  I will post the case.  Nobody has been able to obtain consent from family. My efforts to contact family were also not successful. If we are unable to obtain consent, we will move ahead with emergency consent by two doctors.  This mandible fracture needs to be treated/  Failure to fix this fracture will likely result in a long term problem.

## 2020-02-11 NOTE — ED Notes (Signed)
C-Collar to remain in place at this time.

## 2020-02-11 NOTE — Progress Notes (Signed)
After patient was settled into 4N24, patient stated that the medicine he has received has not helped his pain and that we are not doing anything to help him. Trauma MD notified about pain, new verbal order for 100mg  Fentanyl IV and Robaxin 1000mg  IV every 8 hours. This was mentioned to the patient and explained how it is a new regimen to see if it will relieve the pain. While giving the patient his medicine as ordered, patient stated that he wants to kill himself. Suicide questions were asked and answered and patient does not have a plan but stated that his suicidal thoughts were because of the information he found out about his father in the Emergency Room. He then stated that he takes Abilify everyday and asked me to sit with him. Trauma MD was notified and suicide precautions were placed. Charge nurse was notified for the need for a suicide sitter. This RN will sit with the patient until a sitter arrives.
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
## 2020-02-11 NOTE — ED Notes (Signed)
Pt making multiple attempts to get out of bed. States "I am going home" "I am going to a different hospital, you arnt not helping me here"  Pt educated on NPO status, continues to demand water.  Given Ice chips.  Pt found out of bed drinking water out of faucet, pt told again reason for NPO and dangers of having food or fluid in the stomach during surgery Provider informed
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
## 2020-02-11 NOTE — Progress Notes (Signed)
Cousin Nari Knowledge at bedside. She said she doesn't have a phone number. She came because the mother may not be able to come. I updated her on the pt's condition and plan of care.

## 2020-02-11 NOTE — Progress Notes (Signed)
Attempted to reach pt's mother Roxy Cedar at number listed [phone]. No answer.

## 2020-02-11 NOTE — ED Notes (Signed)
Pt transported to Radiology 
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
## 2020-02-11 NOTE — Progress Notes (Signed)
   02/11/20 0747  Clinical Encounter Type  Visited With Other (Comment)  Visit Type Follow-up;Trauma   Chaplain connected with the person with the number listed for the patient's father in the chart. The person did not know Keymani and that number is also not helpful for [place] in tracking down next of kin.   Alda Ponder, Chaplain

## 2020-02-11 NOTE — Progress Notes (Signed)
Dr Bedelia Person to bedside. She provided update and answered all questions family member Nari Knowledge had.

## 2020-02-12 ENCOUNTER — Inpatient Hospital Stay (HOSPITAL_COMMUNITY): Payer: Medicaid Other | Admitting: Anesthesiology

## 2020-02-12 ENCOUNTER — Encounter (HOSPITAL_COMMUNITY): Admission: EM | Disposition: A | Discharge status: Home / Self Care | Payer: Self-pay | Source: Home / Self Care

## 2020-02-12 DIAGNOSIS — S02620A Fracture of subcondylar process of mandible, unspecified side, initial encounter for closed fracture: Secondary | ICD-10-CM

## 2020-02-12 DIAGNOSIS — S0266XA Fracture of symphysis of mandible, initial encounter for closed fracture: Secondary | ICD-10-CM

## 2020-02-12 HISTORY — PX: ORIF MANDIBULAR FRACTURE: SHX2127

## 2020-02-12 LAB — CBC
HCT: 31.5 % — ABNORMAL LOW (ref 39.0–52.0)
Hemoglobin: 10 g/dL — ABNORMAL LOW (ref 13.0–17.0)
MCH: 27.7 pg (ref 26.0–34.0)
MCHC: 31.7 g/dL (ref 30.0–36.0)
MCV: 87.3 fL (ref 80.0–100.0)
Platelets: 103 10*3/uL — ABNORMAL LOW (ref 150–400)
RBC: 3.61 MIL/uL — ABNORMAL LOW (ref 4.22–5.81)
RDW: 14.1 % (ref 11.5–15.5)
WBC: 6.9 10*3/uL (ref 4.0–10.5)
nRBC: 0 % (ref 0.0–0.2)

## 2020-02-12 SURGERY — OPEN REDUCTION INTERNAL FIXATION (ORIF) MANDIBULAR FRACTURE
Anesthesia: General | Site: Mouth

## 2020-02-12 MED ORDER — BACITRACIN ZINC 500 UNIT/GM EX OINT
TOPICAL_OINTMENT | CUTANEOUS | Status: AC
  Filled 2020-02-12: qty 28.35

## 2020-02-12 MED ORDER — DEXAMETHASONE SODIUM PHOSPHATE 10 MG/ML IJ SOLN
INTRAMUSCULAR | Status: DC | PRN
  Administered 2020-02-12: 10 mg via INTRAVENOUS

## 2020-02-12 MED ORDER — CHLORHEXIDINE GLUCONATE 0.12 % MT SOLN
15.0000 mL | Freq: Two times a day (BID) | OROMUCOSAL | Status: DC
  Administered 2020-02-12 – 2020-02-15 (×6): 15 mL via OROMUCOSAL
  Filled 2020-02-12 (×5): qty 15

## 2020-02-12 MED ORDER — SUCCINYLCHOLINE CHLORIDE 200 MG/10ML IV SOSY
PREFILLED_SYRINGE | INTRAVENOUS | Status: DC | PRN
  Administered 2020-02-12: 120 mg via INTRAVENOUS

## 2020-02-12 MED ORDER — ALBUMIN HUMAN 5 % IV SOLN: INTRAVENOUS | Status: DC | PRN

## 2020-02-12 MED ORDER — LACTATED RINGERS IV SOLN: INTRAVENOUS | Status: DC | PRN

## 2020-02-12 MED ORDER — ROCURONIUM BROMIDE 10 MG/ML (PF) SYRINGE
PREFILLED_SYRINGE | INTRAVENOUS | Status: DC | PRN
  Administered 2020-02-12: 10 mg via INTRAVENOUS

## 2020-02-12 MED ORDER — CEFAZOLIN SODIUM-DEXTROSE 2-4 GM/100ML-% IV SOLN
INTRAVENOUS | Status: AC
  Filled 2020-02-12: qty 100

## 2020-02-12 MED ORDER — ONDANSETRON HCL 4 MG/2ML IJ SOLN
4.0000 mg | Freq: Four times a day (QID) | INTRAMUSCULAR | Status: AC
  Administered 2020-02-12 – 2020-02-15 (×11): 4 mg via INTRAVENOUS
  Filled 2020-02-12 (×11): qty 2

## 2020-02-12 MED ORDER — PROPOFOL 10 MG/ML IV BOLUS
INTRAVENOUS | Status: AC
  Filled 2020-02-12: qty 40

## 2020-02-12 MED ORDER — PROPOFOL 10 MG/ML IV BOLUS
INTRAVENOUS | Status: DC | PRN
  Administered 2020-02-12: 170 mg via INTRAVENOUS
  Administered 2020-02-12: 30 mg via INTRAVENOUS

## 2020-02-12 MED ORDER — OXYMETAZOLINE HCL 0.05 % NA SOLN
NASAL | Status: DC | PRN
  Administered 2020-02-12 (×2): 2 via NASAL

## 2020-02-12 MED ORDER — PHENYLEPHRINE 40 MCG/ML (10ML) SYRINGE FOR IV PUSH (FOR BLOOD PRESSURE SUPPORT)
PREFILLED_SYRINGE | INTRAVENOUS | Status: DC | PRN
  Administered 2020-02-12 (×2): 40 ug via INTRAVENOUS

## 2020-02-12 MED ORDER — CEFAZOLIN SODIUM-DEXTROSE 2-3 GM-%(50ML) IV SOLR
INTRAVENOUS | Status: DC | PRN
  Administered 2020-02-12: 2 g via INTRAVENOUS

## 2020-02-12 MED ORDER — OXYMETAZOLINE HCL 0.05 % NA SOLN
NASAL | Status: AC
  Filled 2020-02-12: qty 30

## 2020-02-12 MED ORDER — LIDOCAINE HCL (PF) 2 % IJ SOLN
INTRAMUSCULAR | Status: AC
  Filled 2020-02-12: qty 5

## 2020-02-12 MED ORDER — SUGAMMADEX SODIUM 200 MG/2ML IV SOLN
INTRAVENOUS | Status: DC | PRN
  Administered 2020-02-12: 130 mg via INTRAVENOUS

## 2020-02-12 MED ORDER — LIDOCAINE 2% (20 MG/ML) 5 ML SYRINGE
INTRAMUSCULAR | Status: DC | PRN
  Administered 2020-02-12: 60 mg via INTRAVENOUS

## 2020-02-12 MED ORDER — ONDANSETRON HCL 4 MG/2ML IJ SOLN
INTRAMUSCULAR | Status: DC | PRN
  Administered 2020-02-12: 4 mg via INTRAVENOUS

## 2020-02-12 MED ORDER — FENTANYL CITRATE (PF) 250 MCG/5ML IJ SOLN
INTRAMUSCULAR | Status: AC
  Filled 2020-02-12: qty 5

## 2020-02-12 MED ORDER — 0.9 % SODIUM CHLORIDE (POUR BTL) OPTIME
TOPICAL | Status: DC | PRN
  Administered 2020-02-12: 1000 mL

## 2020-02-12 MED ORDER — SODIUM CHLORIDE 0.9 % IV SOLN
1.5000 g | Freq: Four times a day (QID) | INTRAVENOUS | Status: DC
  Administered 2020-02-12 – 2020-02-15 (×12): 1.5 g via INTRAVENOUS
  Filled 2020-02-12 (×17): qty 4

## 2020-02-12 MED ORDER — ONDANSETRON HCL 4 MG/2ML IJ SOLN
INTRAMUSCULAR | Status: AC
  Filled 2020-02-12: qty 2

## 2020-02-12 MED ORDER — LIDOCAINE-EPINEPHRINE 1 %-1:100000 IJ SOLN
INTRAMUSCULAR | Status: AC
  Filled 2020-02-12: qty 1

## 2020-02-12 MED ORDER — FENTANYL CITRATE (PF) 100 MCG/2ML IJ SOLN: 25.0000 ug | INTRAMUSCULAR | Status: DC | PRN

## 2020-02-12 MED ORDER — ROCURONIUM BROMIDE 10 MG/ML (PF) SYRINGE
PREFILLED_SYRINGE | INTRAVENOUS | Status: AC
  Filled 2020-02-12: qty 10

## 2020-02-12 MED ORDER — SUCCINYLCHOLINE CHLORIDE 200 MG/10ML IV SOSY
PREFILLED_SYRINGE | INTRAVENOUS | Status: AC
  Filled 2020-02-12: qty 10

## 2020-02-12 MED ORDER — PHENYLEPHRINE 40 MCG/ML (10ML) SYRINGE FOR IV PUSH (FOR BLOOD PRESSURE SUPPORT)
PREFILLED_SYRINGE | INTRAVENOUS | Status: AC
  Filled 2020-02-12: qty 10

## 2020-02-12 MED ORDER — FENTANYL CITRATE (PF) 250 MCG/5ML IJ SOLN
INTRAMUSCULAR | Status: DC | PRN
  Administered 2020-02-12: 100 ug via INTRAVENOUS
  Administered 2020-02-12: 50 ug via INTRAVENOUS
  Administered 2020-02-12: 100 ug via INTRAVENOUS

## 2020-02-12 MED ORDER — LIDOCAINE-EPINEPHRINE 1 %-1:100000 IJ SOLN
INTRAMUSCULAR | Status: DC | PRN
  Administered 2020-02-12: 4 mL

## 2020-02-12 MED ORDER — DEXAMETHASONE SODIUM PHOSPHATE 10 MG/ML IJ SOLN
INTRAMUSCULAR | Status: AC
  Filled 2020-02-12: qty 1

## 2020-02-12 SURGICAL SUPPLY — 44 items
BAR ARCH PREFORM MXLMNDB FX (Miscellaneous) ×2 IMPLANT
BAR FIX PREFORMED OMNIMAX (Miscellaneous) ×2 IMPLANT
BIT DRILL 1.6X50 (BIT) ×1 IMPLANT
BLADE SURG 15 STRL LF DISP TIS (BLADE) IMPLANT
BLADE SURG 15 STRL SS (BLADE)
CANISTER SUCT 3000ML PPV (MISCELLANEOUS) ×2 IMPLANT
CLEANER TIP ELECTROSURG 2X2 (MISCELLANEOUS) ×2 IMPLANT
DRAPE HALF SHEET 40X57 (DRAPES) IMPLANT
ELECT COATED BLADE 2.86 ST (ELECTRODE) ×2 IMPLANT
ELECT REM PT RETURN 9FT ADLT (ELECTROSURGICAL) ×2
ELECTRODE REM PT RTRN 9FT ADLT (ELECTROSURGICAL) ×1 IMPLANT
GLOVE BIO SURGEON STRL SZ7 (GLOVE) ×6 IMPLANT
GOWN STRL REUS W/ TWL LRG LVL3 (GOWN DISPOSABLE) ×2 IMPLANT
GOWN STRL REUS W/TWL LRG LVL3 (GOWN DISPOSABLE) ×4
KIT BASIN OR (CUSTOM PROCEDURE TRAY) ×2 IMPLANT
KIT TURNOVER KIT B (KITS) ×2 IMPLANT
NDL HYPO 25GX1X1/2 BEV (NEEDLE) IMPLANT
NEEDLE HYPO 25GX1X1/2 BEV (NEEDLE) IMPLANT
NS IRRIG 1000ML POUR BTL (IV SOLUTION) ×2 IMPLANT
PAD ARMBOARD 7.5X6 YLW CONV (MISCELLANEOUS) ×4 IMPLANT
PATTIES SURGICAL .5 X3 (DISPOSABLE) IMPLANT
PENCIL SMOKE EVACUATOR (MISCELLANEOUS) ×2 IMPLANT
PLATE 4HOLE STR 1.6MM (Plate) ×1 IMPLANT
SCISSORS WIRE ANG 4 3/4 DISP (INSTRUMENTS) ×2 IMPLANT
SCREW BONE 2X7 CROSS DRIVE (Screw) ×3 IMPLANT
SCREW BONE MANDIB SD 2X9 (Screw) ×4 IMPLANT
SCREW NON LOCK X-DR 2.0X12 (Screw) ×4 IMPLANT
STAPLER VISISTAT 35W (STAPLE) ×2 IMPLANT
SUT BONE WAX W31G (SUTURE) IMPLANT
SUT CHROMIC 3 0 SH 27 (SUTURE) ×1 IMPLANT
SUT ETHILON 3 0 PS 1 (SUTURE) IMPLANT
SUT SILK 3 0 (SUTURE)
SUT SILK 3 0 SH 30 (SUTURE) ×1 IMPLANT
SUT SILK 3-0 18XBRD TIE 12 (SUTURE) IMPLANT
SUT STEEL 0 (SUTURE)
SUT STEEL 0 18XMFL TIE 17 (SUTURE) IMPLANT
SUT STEEL 2 (SUTURE) IMPLANT
SUT VIC AB 3-0 FS2 27 (SUTURE) IMPLANT
SUT VIC AB 3-0 SH 27 (SUTURE) ×6
SUT VIC AB 3-0 SH 27X BRD (SUTURE) IMPLANT
SUT VIC AB 4-0 P-3 18X BRD (SUTURE) IMPLANT
SUT VIC AB 4-0 P3 18 (SUTURE)
TOWEL GREEN STERILE FF (TOWEL DISPOSABLE) ×2 IMPLANT
TRAY ENT MC OR (CUSTOM PROCEDURE TRAY) ×2 IMPLANT
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
## 2020-02-12 NOTE — Progress Notes (Signed)
Pt transferred to 4NP14 from 4NICU24. Vital signs taken and WNL and full assessment documented. SI sitter at bedside. Suction canister set up and wire cutter at head of bed. Pt A&Ox4, complaining of back pain but denies pain medication and also complains of burning in chin/jaw area.   Robina Ade, RN

## 2020-02-12 NOTE — Progress Notes (Signed)
Pt. Transferred from the PACU to his room in the ICU. Pt calm and cooperative at the moment. Is nonverbally asking if he could open his mouth. This Clinical research associate, Medical sales representative has explained to pt that his mouth would be wired shut for a couple of weeks. Pt nodded, indicating they understood. Pt drifted back to sleep. Will continue to monitor pt.

## 2020-02-12 NOTE — Anesthesia Procedure Notes (Signed)
Procedure Name: Intubation Date/Time: 02/12/2020 7:46 AM Performed by: Adria Dill, CRNA Pre-anesthesia Checklist: Patient identified, Emergency Drugs available, Suction available and Patient being monitored Patient Re-evaluated:Patient Re-evaluated prior to induction Oxygen Delivery Method: Circle system utilized Preoxygenation: Pre-oxygenation with 100% oxygen Induction Type: IV induction Ventilation: Mask ventilation without difficulty Laryngoscope Size: Glidescope and 4 Grade View: Grade I Nasal Tubes: Nasal prep performed, Nasal Rae and Left Tube size: 7.5 mm Number of attempts: 1 Placement Confirmation: ETT inserted through vocal cords under direct vision,  positive ETCO2 and breath sounds checked- equal and bilateral Tube secured with: Tape Dental Injury: Teeth and Oropharynx as per pre-operative assessment

## 2020-02-12 NOTE — Op Note (Signed)
Procedure(s): OPEN REDUCTION INTERNAL FIXATION (ORIF) MANDIBULAR FRACTURE MMF Jeremy Taylor male 19 y.o. 02/12/2020  Procedure(s) and Anesthesia Type:    * OPEN REDUCTION INTERNAL FIXATION (ORIF) MANDIBULAR FRACTURE MMF - General  Surgeon(s) and Role:    * Rejeana Brock, MD - Primary   Indications:   This patient presented with 2 mandible fractures.  The anterior fracture involved the symphysis and the more posteriorly located fracture involved the high ramus or subcondylar neck.  Both fractures were nondisplaced.  Preoperative evaluation was difficult due to patient condition and lack of cooperation; however, he appeared to have class III occlusion at the bedside.  It was recommended we go to the operating room for ORIF of the anterior fracture and maxillomandibular fixation.     Surgeon: Rejeana Brock   Assistants: None  Anesthesia: General endotracheal anesthesia   Procedure Detail  OPEN REDUCTION INTERNAL FIXATION (ORIF) MANDIBULAR FRACTURE MMF  Patient was taken to the operating room transferred to the table in supine position.  General anesthesia was established and he was nasotracheally intubated without difficulty.  His face and oral cavity were prepped using Betadine in usual fashion.  He was draped in sterile fashion.  Once asleep, it was fairly easy to confirm that he did in fact have a class III occlusion coupled with a pre-existing crossbite.  I was able to bring him into occlusion fairly easily resulting in appropriate dental contact posteriorly on both sides.  4 cc of 1% lidocaine with 1 100,000 epinephrine were injected into the gingivobuccal sulcus anteriorly.  After an adequate amount of time for vasoconstriction, mucosa was entered using a knife.  Dissection was carried out down to the anterior mandibular symphysis slightly to the left.  I was careful to avoid the mental nerve.  The bone was identified and periosteum incised and elevated on either side  of the fracture line.  At this point my attention turned to maxillomandibular fixation portion of the procedure.  The Lorenz plating system was used and the Omni max MMF system selected for fixation.  The arch bar was secured in place superiorly at the junction of the gingiva and oral cavity mucosa using three 7 mm self drilling screws.  Great care was taken to place the screws between the tooth roots.  This provided excellent stability superiorly.  This point I went back to address the fracture line.  A single 1.6 mm titanium 4-hole plate was used and secured using 2 screws on either side of the fracture line.  This resulted in excellent compression of the fracture line.  Attention then went back to his occlusion.  It was easy to put him back into class III occlusion at this point and an inferior arch bar was placed which will serve as a tension band for the anterior fracture.  3 screws were also used to secure this inferior arch bar all being placed between the tooth roots.  24-gauge prestretched wires within place x 4 resulting in tight occlusion in the premorbid class III position.  The intraoral wound was irrigated with sterile saline and closed using a continuous locking 3-0 Vicryl stitch.  An NG tube was passed into the stomach and the stomach emptied free of secretions.  Patient's care was turned back over to anesthesia for wake-up and extubation.  Estimated Blood Loss:  less than 50 mL              Implants: Please see above  Complications:  * No complications entered in OR log *         Disposition: ICU - extubated and stable.         Condition: stable

## 2020-02-12 NOTE — Transfer of Care (Signed)
Immediate Anesthesia Transfer of Care Note  Patient: Jeremy Taylor  Procedure(s) Performed: OPEN REDUCTION INTERNAL FIXATION (ORIF) MANDIBULAR FRACTURE MMF (N/A Mouth)  Patient Location: PACU  Anesthesia Type:General  Level of Consciousness: awake, drowsy and patient cooperative  Airway & Oxygen Therapy: Patient Spontanous Breathing and Patient connected to face mask oxygen  Post-op Assessment: Report given to RN and Post -op Vital signs reviewed and stable  Post vital signs: Reviewed and stable  Last Vitals:  Vitals Value Taken Time  BP 134/88 02/12/20 0943  Temp    Pulse 85 02/12/20 0946  Resp 13 02/12/20 0946  SpO2 100 % 02/12/20 0946  Vitals shown include unvalidated device data.  Last Pain:  Vitals:   02/12/20 0400  TempSrc: Oral  PainSc:          Complications: No complications documented.

## 2020-02-12 NOTE — Anesthesia Preprocedure Evaluation (Signed)
Anesthesia Evaluation  Patient identified by MRN, date of birth, ID band Patient awake    Reviewed: Allergy & Precautions, NPO status , Patient's Chart, lab work & pertinent test resultsPreop documentation limited or incomplete due to emergent nature of procedure.  Airway      Mouth opening: Limited Mouth Opening  Dental   Pulmonary Current Smoker,    breath sounds clear to auscultation       Cardiovascular negative cardio ROS   Rhythm:Regular Rate:Normal     Neuro/Psych    GI/Hepatic negative GI ROS,   Endo/Other  negative endocrine ROS  Renal/GU negative Renal ROS     Musculoskeletal   Abdominal   Peds  Hematology   Anesthesia Other Findings   Reproductive/Obstetrics                             Anesthesia Physical Anesthesia Plan  ASA: I and emergent  Anesthesia Plan: General   Post-op Pain Management:    Induction: Intravenous  PONV Risk Score and Plan: Ondansetron, Dexamethasone and Midazolam  Airway Management Planned: Nasal ETT  Additional Equipment:   Intra-op Plan:   Post-operative Plan: Extubation in OR  Informed Consent: I have reviewed the patients History and Physical, chart, labs and discussed the procedure including the risks, benefits and alternatives for the proposed anesthesia with the patient or authorized representative who has indicated his/her understanding and acceptance.     Dental advisory given  Plan Discussed with: CRNA and Anesthesiologist  Anesthesia Plan Comments:         Anesthesia Quick Evaluation

## 2020-02-12 NOTE — Anesthesia Postprocedure Evaluation (Signed)
Anesthesia Post Note  Patient: Jeremy Taylor  Procedure(s) Performed: OPEN REDUCTION INTERNAL FIXATION (ORIF) MANDIBULAR FRACTURE MMF (N/A Mouth)     Patient location during evaluation: PACU Anesthesia Type: General Level of consciousness: awake Pain management: pain level controlled Vital Signs Assessment: post-procedure vital signs reviewed and stable Respiratory status: spontaneous breathing Cardiovascular status: stable Postop Assessment: no apparent nausea or vomiting Anesthetic complications: no   No complications documented.  Last Vitals:  Vitals:   02/12/20 1300 02/12/20 1430  BP: 135/75 126/74  Pulse:  80  Resp: 15 20  Temp:  37.2 C  SpO2:  97%    Last Pain:  Vitals:   02/12/20 1200  TempSrc: Axillary  PainSc:                  Destini Cambre

## 2020-02-13 ENCOUNTER — Encounter (HOSPITAL_COMMUNITY): Payer: Self-pay | Admitting: Otolaryngology

## 2020-02-13 DIAGNOSIS — F411 Generalized anxiety disorder: Secondary | ICD-10-CM

## 2020-02-13 LAB — CBC
HCT: 29.9 % — ABNORMAL LOW (ref 39.0–52.0)
Hemoglobin: 10.1 g/dL — ABNORMAL LOW (ref 13.0–17.0)
MCH: 28.5 pg (ref 26.0–34.0)
MCHC: 33.8 g/dL (ref 30.0–36.0)
MCV: 84.2 fL (ref 80.0–100.0)
Platelets: 98 10*3/uL — ABNORMAL LOW (ref 150–400)
RBC: 3.55 MIL/uL — ABNORMAL LOW (ref 4.22–5.81)
RDW: 13.8 % (ref 11.5–15.5)
WBC: 8.9 10*3/uL (ref 4.0–10.5)
nRBC: 0 % (ref 0.0–0.2)

## 2020-02-13 MED ORDER — OXYCODONE HCL 5 MG/5ML PO SOLN
5.0000 mg | ORAL | Status: DC | PRN
  Administered 2020-02-13 – 2020-02-15 (×5): 10 mg via ORAL
  Filled 2020-02-13 (×6): qty 10

## 2020-02-13 MED ORDER — ACETAMINOPHEN 160 MG/5ML PO SOLN
1000.0000 mg | Freq: Four times a day (QID) | ORAL | Status: DC
  Administered 2020-02-13 – 2020-02-15 (×6): 1000 mg via ORAL
  Filled 2020-02-13 (×8): qty 40.6

## 2020-02-13 MED ORDER — ARIPIPRAZOLE 10 MG PO TABS
10.0000 mg | ORAL_TABLET | Freq: Every day | ORAL | Status: DC
  Administered 2020-02-13 – 2020-02-15 (×3): 10 mg via ORAL
  Filled 2020-02-13 (×3): qty 1

## 2020-02-13 MED ORDER — DOCUSATE SODIUM 100 MG PO CAPS
100.0000 mg | ORAL_CAPSULE | Freq: Two times a day (BID) | ORAL | Status: DC
  Administered 2020-02-13 – 2020-02-14 (×2): 100 mg via ORAL
  Filled 2020-02-13: qty 1
  Filled 2020-02-13: qty 2
  Filled 2020-02-13 (×2): qty 1
  Filled 2020-02-13: qty 2

## 2020-02-13 MED ORDER — MORPHINE SULFATE (PF) 2 MG/ML IV SOLN
1.0000 mg | INTRAVENOUS | Status: DC | PRN
  Administered 2020-02-13 – 2020-02-14 (×6): 2 mg via INTRAVENOUS
  Filled 2020-02-13 (×6): qty 1

## 2020-02-13 MED ORDER — BOOST / RESOURCE BREEZE PO LIQD CUSTOM
1.0000 | Freq: Three times a day (TID) | ORAL | Status: DC
  Administered 2020-02-13 – 2020-02-15 (×6): 1 via ORAL

## 2020-02-13 MED ORDER — SERTRALINE HCL 50 MG PO TABS
25.0000 mg | ORAL_TABLET | Freq: Every day | ORAL | Status: DC
  Administered 2020-02-13 – 2020-02-14 (×2): 25 mg via ORAL
  Filled 2020-02-13 (×2): qty 1
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
## 2020-02-13 NOTE — Progress Notes (Signed)
PT Cancellation Note  Patient Details Name: Jeremy Taylor MRN: [id number] DOB: [birth year]   Cancelled Treatment:    Reason Eval/Treat Not Completed: Active bedrest order. Order for bedrest x 72 hours (beginning 12/4 1315). Order in chart for PT eval to start tomorrow, 12/7.    Ilda Foil 02/13/2020, 7:39 AM   Aida Raider, PT  Office # [phone] Pager [phone]
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
## 2020-02-13 NOTE — Progress Notes (Signed)
OT Cancellation Note  Patient Details Name: Jeremy Taylor MRN: [id number] DOB: 2001-02-23   Cancelled Treatment:    Reason Eval/Treat Not Completed: Active bedrest order. Pt on bedrest with OT eval order to start tomorrow.   Ignacia Palma, OTR/L Acute Rehab Services Pager [phone] Office [phone]     Evette Georges 02/13/2020, 7:37 AM

## 2020-02-13 NOTE — TOC CAGE-AID Note (Addendum)
Transition of Care [facility]) - CAGE-AID Screening   Patient Details  Name: Jeremy Taylor MRN: [id number] Date of Birth: 30-Oct-2000  [facility] CM/SW Contact:    Glennon Mac, RN Phone Number: 02/13/2020, 3:25 PM   Clinical Narrative: Pt denies need for ETOH/drug resources; admits to occasional ETOH use   CAGE-AID Screening:  Have You Ever Felt You Ought to Cut Down on Your Drinking or Drug Use?: No Have People Annoyed You By Critizing Your Drinking Or Drug Use?: No Have You Felt Bad Or Guilty About Your Drinking Or Drug Use?: No Have You Ever Had a Drink or Used Drugs First Thing In The Morning to Steady Your Nerves or to Get Rid of a Hangover?: No CAGE-AID Score: 0  Substance Abuse Education Offered: Yes    Quintella Baton, RN, BSN  Trauma/Neuro ICU Case Manager [phone]

## 2020-02-13 NOTE — Plan of Care (Signed)
  Problem: Education: Goal: Knowledge of General Education information will improve Description Including pain rating scale, medication(s)/side effects and non-pharmacologic comfort measures Outcome: Progressing   
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
## 2020-02-14 LAB — CBC
HCT: 28.7 % — ABNORMAL LOW (ref 39.0–52.0)
Hemoglobin: 9.5 g/dL — ABNORMAL LOW (ref 13.0–17.0)
MCH: 28.4 pg (ref 26.0–34.0)
MCHC: 33.1 g/dL (ref 30.0–36.0)
MCV: 85.9 fL (ref 80.0–100.0)
Platelets: 88 10*3/uL — ABNORMAL LOW (ref 150–400)
RBC: 3.34 MIL/uL — ABNORMAL LOW (ref 4.22–5.81)
RDW: 14 % (ref 11.5–15.5)
WBC: 4.1 10*3/uL (ref 4.0–10.5)
nRBC: 0 % (ref 0.0–0.2)

## 2020-02-14 MED ORDER — TRAMADOL HCL 50 MG PO TABS
50.0000 mg | ORAL_TABLET | Freq: Four times a day (QID) | ORAL | Status: DC
  Administered 2020-02-14 – 2020-02-15 (×5): 50 mg via ORAL
  Filled 2020-02-14 (×5): qty 1
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
## 2020-02-14 NOTE — Plan of Care (Signed)
  Problem: Education: Goal: Knowledge of General Education information will improve Description Including pain rating scale, medication(s)/side effects and non-pharmacologic comfort measures Outcome: Progressing   Problem: Health Behavior/Discharge Planning: Goal: Ability to manage health-related needs will improve Outcome: Progressing   
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
## 2020-02-15 LAB — CBC
HCT: 30.4 % — ABNORMAL LOW (ref 39.0–52.0)
Hemoglobin: 10.2 g/dL — ABNORMAL LOW (ref 13.0–17.0)
MCH: 28.9 pg (ref 26.0–34.0)
MCHC: 33.6 g/dL (ref 30.0–36.0)
MCV: 86.1 fL (ref 80.0–100.0)
Platelets: 106 10*3/uL — ABNORMAL LOW (ref 150–400)
RBC: 3.53 MIL/uL — ABNORMAL LOW (ref 4.22–5.81)
RDW: 14.1 % (ref 11.5–15.5)
WBC: 3.2 10*3/uL — ABNORMAL LOW (ref 4.0–10.5)
nRBC: 0 % (ref 0.0–0.2)

## 2020-02-15 MED ORDER — OXYCODONE HCL 5 MG/5ML PO SOLN: 5.0000 mg | Freq: Four times a day (QID) | ORAL | 0 refills | Status: DC | PRN

## 2020-02-15 MED ORDER — SERTRALINE HCL 25 MG PO TABS
25.0000 mg | ORAL_TABLET | Freq: Every day | ORAL | 0 refills | Status: DC
Start: 2020-02-15 — End: 2020-03-02

## 2020-02-15 MED ORDER — POLYETHYLENE GLYCOL 3350 17 G PO PACK
17.0000 g | PACK | Freq: Every day | ORAL | 0 refills | Status: DC
Start: 2020-02-15 — End: 2020-03-02

## 2020-02-15 MED ORDER — MORPHINE SULFATE (PF) 2 MG/ML IV SOLN: 1.0000 mg | INTRAVENOUS | Status: DC | PRN

## 2020-02-15 MED ORDER — CHLORHEXIDINE GLUCONATE 0.12 % MT SOLN
15.0000 mL | Freq: Two times a day (BID) | OROMUCOSAL | 0 refills | Status: DC
Start: 2020-02-15 — End: 2020-03-02

## 2020-02-15 MED ORDER — ACETAMINOPHEN 160 MG/5ML PO SOLN: 1000.0000 mg | Freq: Four times a day (QID) | ORAL | 0 refills | Status: DC | PRN

## 2020-02-15 MED ORDER — POLYETHYLENE GLYCOL 3350 17 G PO PACK
17.0000 g | PACK | Freq: Every day | ORAL | Status: DC
  Administered 2020-02-15: 17 g via ORAL
  Filled 2020-02-15: qty 1

## 2020-02-15 MED ORDER — CEPHALEXIN 250 MG/5ML PO SUSR: 500.0000 mg | Freq: Four times a day (QID) | ORAL | 0 refills | Status: AC

## 2020-02-15 MED ORDER — METHOCARBAMOL 500 MG PO TABS
1000.0000 mg | ORAL_TABLET | Freq: Three times a day (TID) | ORAL | 0 refills | Status: DC | PRN
Start: 2020-02-15 — End: 2020-03-02

## 2020-02-15 MED ORDER — TRAMADOL HCL 50 MG PO TABS: 50.0000 mg | ORAL_TABLET | Freq: Four times a day (QID) | ORAL | 0 refills | Status: DC | PRN

## 2020-02-15 MED ORDER — METHOCARBAMOL 500 MG PO TABS
1000.0000 mg | ORAL_TABLET | Freq: Three times a day (TID) | ORAL | Status: DC
  Administered 2020-02-15 (×2): 1000 mg via ORAL
  Filled 2020-02-15 (×2): qty 2

## 2020-02-15 NOTE — Progress Notes (Signed)
Discharge instructions given. Patient's mother verbalized understanding and all questions were answered.
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
## 2020-02-15 NOTE — Progress Notes (Signed)
   02/15/20 1036  Clinical Encounter Type  Visited With Patient  Visit Type Follow-up  Referral From Physician  Consult/Referral To Chaplain   Chaplain responded to consult. Pt was sleepy and nodded off during encounter. When asked, Pt stated a different time might be better.   This note was prepared by Chaplain Resident, Tacy Learn, MDiv. Chaplain remains available as needed through the on-call pager: [phone].

## 2020-02-15 NOTE — TOC Transition Note (Addendum)
Transition of Care [facility]) - CM/SW Discharge Note   Patient Details  Name: [name] MRN: [id number] Date of Birth: 07-07-2000  Transition of Care [facility]) CM/SW Contact:  Glennon Mac, RN Phone Number: 02/15/2020, 1:38 PM   Clinical Narrative: Patient medically stable for discharge home today with family.  Spoke with patient's mother, Windell Moulding by phone; provided reinforcement about follow-up of mental health services with patient, and grief/loss resources for patient and family.  Instructed mother to follow-up at [facility] clerk of court for petition for guardianship for patient.  She states she will go to [facility] court house and follow-up ASAP; she is aware that there will be a court hearing at some point.  She is appreciative of help given; she seems overwhelmed with the loss of her partner, and patient's father.  She states they were best friends.  She plans to follow-up with grief/loss counselors for the whole family. Trauma/Acute Stress Response Packet given to patient.    Final next level of care: Home/Self Care Barriers to Discharge: Barriers Resolved                       Discharge Plan and Services   Discharge Planning Services: CM Consult                                 Social Determinants of Health (SDOH) Interventions     Readmission Risk Interventions Readmission Risk Prevention Plan 02/15/2020  Post Dischage Appt Complete  Medication Screening Complete  Transportation Screening Complete   Quintella Baton, RN, BSN  Trauma/Neuro ICU Case Manager [phone]
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
## 2020-02-15 NOTE — Discharge Instructions (Signed)
Full Liquid Diet A full liquid diet refers to fluids and foods that are liquid or will become liquid at room temperature. This diet should only be used for a short period of time to help you recover from illness or surgery. Your health care provider or dietitian will help you determine when it is safe to eat regular foods. What are tips for following this plan?     Reading food labels  Check food labels of nutrition shakes for the amount of protein. Look for nutrition shakes that have at least 8-10 grams of protein in each serving.  Look for drinks, such as milks and juices, that are "fortified" or "enriched." This means that vitamins and minerals have been added. Shopping  Buy premade nutritional shakes to keep on hand.  To vary your choices, buy different flavors of milks and shakes. Meal planning  Choose flavors and foods that you enjoy.  To make sure you get enough energy from food (calories): ? Eat 3 full liquid meals each day. Have a liquid snack between each meal. ? Drink 6-8 ounces (177-237 ml) of a nutrition supplement shake with meals or as snacks. ? Add protein powder, powdered milk, milk, or yogurt to shakes to increase the amount of protein.  Drink at least one serving a day of citrus fruit juice or fruit juice that has vitamin C added. General guidelines  Before starting the full-liquid diet, check with your health care provider to know what foods you should avoid. These may include full-fat or high-fiber liquids.  You may have any liquid or food that becomes a liquid at room temperature. The food is considered a liquid if it can be poured off a spoon at room temperature.  Do not drink alcohol unless approved by your health care provider.  This diet gives you most of the nutrients that you need for energy, but you may not get enough of certain vitamins, minerals, and fiber. Make sure to talk to your health care provider or dietitian about: ? How many calories you need  to eat get day. ? How much fluid you should have each day. ? Taking a multivitamin or a nutritional supplement. What foods are allowed? The items listed may not be a complete list. Talk with your dietitian about what dietary choices are best for you. Grains Thin hot cereal, such as cream of wheat. Soft-cooked pasta or rice pured in soup. Vegetables Pulp-free tomato or vegetable juice. Vegetables pured in soup. Fruits Fruit juice without pulp. Strained fruit pures (seeds and skins removed). Meats and other protein foods Beef, chicken, and fish broths. Powdered protein supplements. Dairy Milk and milk-based beverages, including milk shakes and instant breakfast mixes. Smooth yogurt. Pured cottage cheese. Beverages Water. Coffee and tea (caffeinated or decaffeinated). Cocoa. Liquid nutritional supplements. Soft drinks. Nondairy milks, such as almond, coconut, rice, or soy milk. Fats and oils Melted margarine and butter. Cream. Canola, almond, avocado, corn, grapeseed, sunflower, and sesame oils. Gravy. Sweets and desserts Custard. Pudding. Flavored gelatin. Smooth ice cream (without nuts or candy pieces). Sherbet. Popsicles. Italian ice. Pudding pops. Seasoning and other foods Salt and pepper. Spices. Cocoa powder. Vinegar. Ketchup. Yellow mustard. Smooth sauces, such as Hollandaise, cheese sauce, or white sauce. Soy sauce. Cream soups. Strained soups. Syrup. Honey. Jelly (without fruit pieces). What foods are not allowed? The items listed may not be a complete list. Talk with your dietitian about what dietary choices are best for you. Grains Whole grains. Pasta. Rice. Cold cereal. Bread. Crackers.   Vegetables All whole fresh, frozen, or canned vegetables. Fruits All whole fresh, frozen, or canned fruits. Meats and other protein foods All cuts of meat, poultry, and fish. Eggs. Tofu and soy protein. Nuts and nut butters. Lunch meat. Sausage. Dairy Hard cheese. Yogurt with fruit  chunks. Fats and oils Coconut oil. Palm oil. Lard. Cold butter. Sweets and desserts Ice cream or other frozen desserts that have any solids in them or on top, such as nuts, chocolate chips, and pieces of cookies. Cakes. Cookies. Candy. Seasoning and other foods Stone-ground mustards. Soups with chunks or pieces. Summary  A full liquid diet refers to fluids and foods that are liquid or will become liquid at room temperature.  This diet should only be used for a short period of time to help you recover from illness or surgery. Ask your health care provider or dietitian when it is safe for you to eat regular foods.  To make sure you get enough calories and nutrients, eat 3 meals each day with snacks between. Drink premade nutrition supplement shakes or add protein powder to homemade shakes. Take a vitamin and mineral supplement as told by your health care provider. This information is not intended to replace advice given to you by your health care provider. Make sure you discuss any questions you have with your health care provider. Document Revised: 05/23/2017 Document Reviewed: 04/09/2016 Elsevier Patient Education  2020 Else[employer] Inc[employer]    Splenic Injury  A splenic injury is an injury of the spleen. The spleen is an organ located in the upper left area of the abdomen, just under the ribs. The spleen filters and cleans the blood. It also stores blood cells and destroys cells that are worn out. The spleen also plays an important role in fighting disease. Splenic injuries can vary. In some cases, the spleen may only be bruised, with some bleeding inside the covering and around the spleen. Splenic injuries may also cause a deep tear or cut into the spleen (lacerated spleen). Some splenic injuries can cause the spleen to break open (rupture). What are the causes? This condition may be caused by a direct blow (trauma) to the spleen. Trauma can result from:  Car accidents.  Contact  sports.  Falls.  Penetrating injuries. These can be caused by gunshot wounds or sharp objects such as a knife. What increases the risk? You may be at greater risk for a splenic injury if you have a disease that can cause the spleen to become enlarged. These include:  Alcoholic liver disease.  Viral infections, especially mononucleosis.  Certain inflammatory diseases, such as lupus.  Certain cancers, especially those that involve the lymphatic system.  Cystic fibrosis. What are the signs or symptoms? Symptoms of this condition depend on the severity of the injury. A minor injury often causes no symptoms or only minor pain in the abdomen. A major injury can result in severe bleeding, causing your blood pressure to decrease rapidly. This in turn will cause symptoms such as:  Dizziness or light-headedness.  Rapid heart rate.  Difficulty breathing.  Fainting.  Sweating with clammy skin. Other symptoms of a splenic injury may include:  Very bad abdominal pain.  Pain in the left shoulder.  Pain when the abdomen is pressed (tenderness).  Nausea.  Swelling or bruising of the abdomen. How is this diagnosed? This condition may be diagnosed based on:  Your symptoms and medical history, especially if you were recently in an accident or you recently got hurt.  A physical  exam.  Imaging tests, such as: ? CT scan. ? Ultrasound. You may have frequent blood tests for a few days after the injury to monitor your condition. How is this treated? Treatment depends on the type of splenic injury you have and how bad it is.  Less severe injuries may be treated with: ? Observation. ? Interventional radiology. This involves using flexible tubes (catheters) to stop the bleeding from inside the blood vessel.  More severe injuries may require hospitalization in the intensive care unit (ICU). While you are in the hospital: ? Your fluid and blood levels will be monitored closely. ? You  will get fluids through an IV as needed. ? You may need follow-up scans to check whether your spleen is able to heal itself. If the injury is getting worse, you may need surgery. ? You may receive donated blood (transfusion). ? You may have a long needle inserted into your abdomen to remove any blood that has collected inside the spleen (hematoma).  If your blood pressure is too low, you may need emergency surgery. This may include: ? Repairing a laceration. ? Removing part of the spleen. ? Removing the entire spleen (splenectomy). Follow these instructions at home: Activity  Rest as told by your health care provider.  Avoid sitting for a long time without moving. Get up to take short walks every 1-2 hours. This is important to improve blood flow and breathing. Ask for help if you feel weak or unsteady.  Do not participate in any activity that takes a lot of effort until your health care provider says that it is safe.  Do not take part in contact sports until your health care provider says it is safe to do so.  Do not lift anything that is heavier than 10 lb (4.5 kg), or the limit that you are told, until your health care provider says that it is safe. General instructions  Take over-the-counter and prescription medicines only as told by your health care provider.  Stay up-to-date on vaccines as told by your health care provider.  Follow instructions from your health care provider about eating or drinking restrictions.  Do not drink alcohol.  Do not use any products that contain nicotine or tobacco, such as cigarettes and e-cigarettes. These may delay healing after an injury. If you need help quitting, ask your health care provider.  Keep all follow-up visits as told by your health care provider. This is important. Get help right away if you have:  A fever.  New or increasing pain in your abdomen or in your left shoulder.  Signs or symptoms of internal bleeding. Watch  for: ? Sweating. ? Dizziness. ? Weakness. ? Cold and clammy skin. ? Fainting.  Chest pain or difficulty breathing. Summary  The spleen is an organ located in the upper left area of the abdomen, just under the ribs.  A splenic injury is an injury of the spleen. This may be caused by a direct blow (trauma) to the spleen.  You may be at greater risk for a splenic injury if you have a disease that can cause the spleen to become enlarged.  A minor injury often causes no symptoms or only minor pain in the abdomen. A major injury can result in severe bleeding, causing your blood pressure to decrease rapidly.  Treatment depends on the type of splenic injury you have and how bad it is. This information is not intended to replace advice given to you by your health care provider.  Make sure you discuss any questions you have with your health care provider. Document Revised: 05/08/2017 Document Reviewed: 02/18/2017 Elsevier Patient Education  2020 [employer].

## 2020-02-25 ENCOUNTER — Encounter (HOSPITAL_COMMUNITY): Payer: Self-pay | Admitting: Emergency Medicine

## 2020-02-25 ENCOUNTER — Emergency Department (HOSPITAL_COMMUNITY)
Admission: EM | Admit: 2020-02-25 | Discharge: 2020-02-26 | Disposition: A | Discharge status: Home / Self Care | Payer: Medicaid Other | Source: Home / Self Care

## 2020-02-25 ENCOUNTER — Emergency Department (HOSPITAL_COMMUNITY)
Admission: EM | Admit: 2020-02-25 | Discharge: 2020-02-25 | Disposition: A | Discharge status: Home / Self Care | Payer: Medicaid Other | Attending: Emergency Medicine | Admitting: Emergency Medicine

## 2020-02-25 ENCOUNTER — Emergency Department (HOSPITAL_COMMUNITY)
Admission: EM | Admit: 2020-02-25 | Discharge: 2020-02-25 | Disposition: A | Discharge status: Home / Self Care | Payer: Medicaid Other | Source: Home / Self Care | Attending: Emergency Medicine | Admitting: Emergency Medicine

## 2020-02-25 ENCOUNTER — Emergency Department (HOSPITAL_COMMUNITY): Payer: Medicaid Other

## 2020-02-25 ENCOUNTER — Other Ambulatory Visit: Payer: Self-pay

## 2020-02-25 DIAGNOSIS — Z5321 Procedure and treatment not carried out due to patient leaving prior to being seen by health care provider: Secondary | ICD-10-CM | POA: Insufficient documentation

## 2020-02-25 DIAGNOSIS — F17210 Nicotine dependence, cigarettes, uncomplicated: Secondary | ICD-10-CM | POA: Insufficient documentation

## 2020-02-25 DIAGNOSIS — S069X9A Unspecified intracranial injury with loss of consciousness of unspecified duration, initial encounter: Secondary | ICD-10-CM | POA: Insufficient documentation

## 2020-02-25 DIAGNOSIS — W228XXA Striking against or struck by other objects, initial encounter: Secondary | ICD-10-CM | POA: Diagnosis not present

## 2020-02-25 DIAGNOSIS — K1379 Other lesions of oral mucosa: Secondary | ICD-10-CM | POA: Insufficient documentation

## 2020-02-25 DIAGNOSIS — R519 Headache, unspecified: Secondary | ICD-10-CM | POA: Insufficient documentation

## 2020-02-25 DIAGNOSIS — Y92410 Unspecified street and highway as the place of occurrence of the external cause: Secondary | ICD-10-CM | POA: Insufficient documentation

## 2020-02-25 MED ORDER — ACETAMINOPHEN 325 MG PO TABS
650.0000 mg | ORAL_TABLET | Freq: Once | ORAL | Status: AC
  Administered 2020-02-25: 20:00:00 650 mg via ORAL
  Filled 2020-02-25: qty 2
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
## 2020-02-25 NOTE — Discharge Instructions (Addendum)
Call the ENT doctor, Dr. Dillard Cannon, for an appointment to be seen soon as possible.  He can help you with the pain in the mouth from the hardware placed for the mandible fracture.  Also make sure that you follow-up with the trauma clinic as scheduled; at 9:20 AM.
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
## 2020-02-25 NOTE — ED Notes (Signed)
Spoke with pt. Mother and she stated that she will be here to pick him up in about 30 min
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
## 2020-02-25 NOTE — ED Notes (Signed)
Dinner tray given to pt

## 2020-02-25 NOTE — ED Notes (Signed)
[place], PA notified of pt and order received for head CT.

## 2020-02-26 ENCOUNTER — Encounter (HOSPITAL_COMMUNITY): Payer: Self-pay

## 2020-02-26 ENCOUNTER — Emergency Department (HOSPITAL_COMMUNITY)
Admission: EM | Admit: 2020-02-26 | Discharge: 2020-02-27 | Disposition: A | Discharge status: Home / Self Care | Payer: Medicaid Other | Attending: Emergency Medicine | Admitting: Emergency Medicine

## 2020-02-26 ENCOUNTER — Other Ambulatory Visit: Payer: Self-pay

## 2020-02-26 ENCOUNTER — Emergency Department (HOSPITAL_COMMUNITY): Payer: Medicaid Other

## 2020-02-26 DIAGNOSIS — Z20822 Contact with and (suspected) exposure to covid-19: Secondary | ICD-10-CM | POA: Insufficient documentation

## 2020-02-26 DIAGNOSIS — R2689 Other abnormalities of gait and mobility: Secondary | ICD-10-CM | POA: Insufficient documentation

## 2020-02-26 DIAGNOSIS — R45851 Suicidal ideations: Secondary | ICD-10-CM | POA: Insufficient documentation

## 2020-02-26 DIAGNOSIS — Z79899 Other long term (current) drug therapy: Secondary | ICD-10-CM | POA: Insufficient documentation

## 2020-02-26 DIAGNOSIS — F200 Paranoid schizophrenia: Secondary | ICD-10-CM | POA: Insufficient documentation

## 2020-02-26 DIAGNOSIS — R52 Pain, unspecified: Secondary | ICD-10-CM

## 2020-02-26 DIAGNOSIS — F17210 Nicotine dependence, cigarettes, uncomplicated: Secondary | ICD-10-CM | POA: Insufficient documentation

## 2020-02-26 LAB — COMPREHENSIVE METABOLIC PANEL
ALT: 25 U/L (ref 0–44)
AST: 22 U/L (ref 15–41)
Albumin: 4.1 g/dL (ref 3.5–5.0)
Alkaline Phosphatase: 118 U/L (ref 38–126)
Anion gap: 12 (ref 5–15)
BUN: 14 mg/dL (ref 6–20)
CO2: 25 mmol/L (ref 22–32)
Calcium: 9.4 mg/dL (ref 8.9–10.3)
Chloride: 102 mmol/L (ref 98–111)
Creatinine, Ser: 0.71 mg/dL (ref 0.61–1.24)
GFR, Estimated: >60 mL/min (ref >60)
Glucose, Bld: 98 mg/dL (ref 70–99)
Potassium: 3.8 mmol/L (ref 3.5–5.1)
Sodium: 139 mmol/L (ref 135–145)
Total Bilirubin: 0.3 mg/dL (ref 0.3–1.2)
Total Protein: 7 g/dL (ref 6.5–8.1)

## 2020-02-26 LAB — RESP PANEL BY RT-PCR (FLU A&B, COVID) ARPGX2
Influenza A by PCR: NEGATIVE
Influenza B by PCR: NEGATIVE
SARS Coronavirus 2 by RT PCR: NEGATIVE

## 2020-02-26 LAB — CBC WITH DIFFERENTIAL/PLATELET
Abs Immature Granulocytes: 0.01 10*3/uL (ref 0.00–0.07)
Basophils Absolute: 0 10*3/uL (ref 0.0–0.1)
Basophils Relative: 0 %
Eosinophils Absolute: 0.3 10*3/uL (ref 0.0–0.5)
Eosinophils Relative: 4 %
HCT: 36.2 % — ABNORMAL LOW (ref 39.0–52.0)
Hemoglobin: 11.8 g/dL — ABNORMAL LOW (ref 13.0–17.0)
Immature Granulocytes: 0 %
Lymphocytes Relative: 55 %
Lymphs Abs: 3.4 10*3/uL (ref 0.7–4.0)
MCH: 29.1 pg (ref 26.0–34.0)
MCHC: 32.6 g/dL (ref 30.0–36.0)
MCV: 89.2 fL (ref 80.0–100.0)
Monocytes Absolute: 0.4 10*3/uL (ref 0.1–1.0)
Monocytes Relative: 7 %
Neutro Abs: 2.1 10*3/uL (ref 1.7–7.7)
Neutrophils Relative %: 34 %
Platelets: 256 10*3/uL (ref 150–400)
RBC: 4.06 MIL/uL — ABNORMAL LOW (ref 4.22–5.81)
RDW: 15.1 % (ref 11.5–15.5)
WBC: 6.2 10*3/uL (ref 4.0–10.5)
nRBC: 0 % (ref 0.0–0.2)

## 2020-02-26 LAB — ETHANOL: Alcohol, Ethyl (B): <10 mg/dL (ref <10)

## 2020-02-26 MED ORDER — ACETAMINOPHEN 160 MG/5ML PO SOLN: 1000.0000 mg | Freq: Four times a day (QID) | ORAL | Status: DC | PRN

## 2020-02-26 MED ORDER — AMITRIPTYLINE HCL 10 MG PO TABS
10.0000 mg | ORAL_TABLET | Freq: Every day | ORAL | Status: DC
  Administered 2020-02-26: 22:00:00 10 mg via ORAL
  Filled 2020-02-26 (×2): qty 1

## 2020-02-26 MED ORDER — ARIPIPRAZOLE 10 MG PO TABS
20.0000 mg | ORAL_TABLET | Freq: Every day | ORAL | Status: DC
  Administered 2020-02-27: 20 mg via ORAL
  Filled 2020-02-26 (×2): qty 2

## 2020-02-26 MED ORDER — SERTRALINE HCL 50 MG PO TABS
25.0000 mg | ORAL_TABLET | Freq: Every day | ORAL | Status: DC
  Administered 2020-02-26: 22:00:00 25 mg via ORAL
  Filled 2020-02-26: qty 1

## 2020-02-26 NOTE — ED Notes (Addendum)
Patient was sleeping in lobby and did not hear his name when called for room placement. Patient moved to treatment room at this time.
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
## 2020-02-26 NOTE — ED Notes (Signed)
Patient called for room placement x2 with no answer. 

## 2020-02-26 NOTE — ED Notes (Signed)
Patient called for room placement x1 with no answer. 

## 2020-02-26 NOTE — ED Notes (Signed)
TTS at bedside. 
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
## 2020-02-26 NOTE — ED Provider Notes (Signed)
Patient did not answer the call to come to the treatment room, from the waiting room.   Mancel Bale, MD 02/26/20 [phone]

## 2020-02-26 NOTE — ED Notes (Signed)
Patient belongings placed in bags, stickers on bags and placed in cabinet in Triage

## 2020-02-26 NOTE — ED Notes (Addendum)
Pt. was under the covers when I stepped in the room. Unable to check vitals pt would not respond to me or come out from under the covers.

## 2020-02-26 NOTE — ED Notes (Signed)
Pt left AMA pt stated that his ride was here to get him

## 2020-02-26 NOTE — [facility] Counselor (Signed)
Marchelle Folks, NT/NS to set up TTS cart. Clinician to call cart in 5 minutes.      Redmond Pulling, MS, [facility], [facility] Triage Specialist [phone]

## 2020-02-27 ENCOUNTER — Inpatient Hospital Stay (HOSPITAL_COMMUNITY)
Admission: AD | Admit: 2020-02-27 | Discharge: 2020-03-02 | DRG: 885 | Disposition: A | Discharge status: Home / Self Care | Payer: Medicaid Other | Source: Intra-hospital | Attending: Psychiatry | Admitting: Psychiatry

## 2020-02-27 ENCOUNTER — Other Ambulatory Visit: Payer: Self-pay

## 2020-02-27 ENCOUNTER — Encounter (HOSPITAL_COMMUNITY): Payer: Self-pay | Admitting: Psychiatry

## 2020-02-27 DIAGNOSIS — F200 Paranoid schizophrenia: Principal | ICD-10-CM | POA: Diagnosis present

## 2020-02-27 DIAGNOSIS — R45851 Suicidal ideations: Secondary | ICD-10-CM | POA: Diagnosis present

## 2020-02-27 DIAGNOSIS — Z634 Disappearance and death of family member: Secondary | ICD-10-CM

## 2020-02-27 DIAGNOSIS — F251 Schizoaffective disorder, depressive type: Secondary | ICD-10-CM | POA: Diagnosis present

## 2020-02-27 DIAGNOSIS — G4710 Hypersomnia, unspecified: Secondary | ICD-10-CM | POA: Diagnosis present

## 2020-02-27 DIAGNOSIS — F29 Unspecified psychosis not due to a substance or known physiological condition: Secondary | ICD-10-CM | POA: Diagnosis not present

## 2020-02-27 DIAGNOSIS — F4321 Adjustment disorder with depressed mood: Secondary | ICD-10-CM | POA: Diagnosis present

## 2020-02-27 HISTORY — DX: Anxiety disorder, unspecified: F41.9

## 2020-02-27 HISTORY — DX: Depression, unspecified: F32.A

## 2020-02-27 MED ORDER — ACETAMINOPHEN 325 MG PO TABS
650.0000 mg | ORAL_TABLET | Freq: Four times a day (QID) | ORAL | Status: DC | PRN
  Administered 2020-02-29 – 2020-03-02 (×3): 650 mg via ORAL
  Filled 2020-02-27 (×3): qty 2

## 2020-02-27 MED ORDER — TRAZODONE HCL 50 MG PO TABS
50.0000 mg | ORAL_TABLET | Freq: Every evening | ORAL | Status: DC | PRN
  Administered 2020-02-28 – 2020-03-01 (×3): 50 mg via ORAL
  Filled 2020-02-27: qty 1
  Filled 2020-02-27: qty 7
  Filled 2020-02-27: qty 1
  Filled 2020-02-27: qty 7
  Filled 2020-02-27 (×2): qty 1

## 2020-02-27 MED ORDER — ARIPIPRAZOLE 2 MG PO TABS
20.0000 mg | ORAL_TABLET | Freq: Every day | ORAL | Status: DC
  Administered 2020-02-27 – 2020-02-28 (×2): 20 mg via ORAL
  Filled 2020-02-27 (×6): qty 10

## 2020-02-27 MED ORDER — AMITRIPTYLINE HCL 10 MG PO TABS
10.0000 mg | ORAL_TABLET | Freq: Every day | ORAL | Status: DC
  Filled 2020-02-27 (×4): qty 1

## 2020-02-27 MED ORDER — ZIPRASIDONE MESYLATE 20 MG IM SOLR: 20.0000 mg | Freq: Two times a day (BID) | INTRAMUSCULAR | Status: DC | PRN

## 2020-02-27 MED ORDER — ALUM & MAG HYDROXIDE-SIMETH 200-200-20 MG/5ML PO SUSP: 30.0000 mL | ORAL | Status: DC | PRN

## 2020-02-27 MED ORDER — LORAZEPAM 1 MG PO TABS
1.0000 mg | ORAL_TABLET | ORAL | Status: AC | PRN
  Administered 2020-03-02: 08:00:00 1 mg via ORAL
  Filled 2020-02-27: qty 1

## 2020-02-27 MED ORDER — SERTRALINE HCL 25 MG PO TABS
25.0000 mg | ORAL_TABLET | Freq: Every day | ORAL | Status: DC
  Administered 2020-02-27 – 2020-03-02 (×5): 25 mg via ORAL
  Filled 2020-02-27 (×8): qty 1

## 2020-02-27 MED ORDER — MAGNESIUM HYDROXIDE 400 MG/5ML PO SUSP: 30.0000 mL | Freq: Every day | ORAL | Status: DC | PRN
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
## 2020-02-27 NOTE — ED Provider Notes (Signed)
Emergency Medicine Observation Re-evaluation Note  Lawson D Brune is a 19 y.o. male, seen on rounds today.  Pt initially presented to the ED for complaints of Leg Pain And mental health problems.  Currently, the patient is reassessment by psychiatry.  Physical Exam  BP 125/80   Pulse 64   Temp 98.6 F (37 C) (Oral)   Resp 16   SpO2 100%  Physical Exam General: Calm, cooperative Cardiac: Regular rhythm Lungs: Breathing easily Psych: Resting, no distress  ED Course / MDM  EKG:    I have reviewed the labs performed to date as well as medications administered while in observation.  Recent changes in the last 24 hours include assessment by behavioral health team.  Plan is for reassessment.  Plan  Current plan is for psychiatric reassessment.    Linwood Dibbles, MD 02/27/20 1002

## 2020-02-27 NOTE — Consult Note (Signed)
Telepsych Consultation   Reason for Consult:  Suicidal ideaitons Referring Physician:  EPD [facility]  Patient Identification: Taylor AdieDerious D Salaam MRN:  [id number] Principal Diagnosis: <principal problem not specified> Diagnosis:  Active Problems:   * No active hospital problems. *   Total Time spent with patient: 15 minutes  Subjective:   Jeremy Taylor is a 19 y.o. male patient admitted for suicidal ideations.  Patient reports auditory and visual hallucinations. Patient reported " I am homeless and I don't have any medicaitons."  patient was restarted on medications since this admission. (Abilify and Zoloft)   Patient appears to be responding to internal stimuli.  Patient provided verbal authorization to follow-up with mother Jeremy BartersRuth Taylor. Will seek inpatient admission. Support, encouragement and reassurances was provided.   HPI:  Per admission assessment note: 19 year old male who has history of paranoid schizophrenia has been off his medications for the past 2 days presents with suicidal ideations.  Patient seen in the ED multiple times for various type complaints.  Most recently yesterday for a fall.  Denies any recent history of trauma at this time.  Did have a head CT done yesterday and that was reviewed and was without acute findings.  Patient states that he has been responding to internal stimuli.  States that his auditory hallucinations have been increasing.  Does admit to marijuana use denies any alcohol.  Does admit to taking a Percocet that a friend gave him  Past Psychiatric History:   Risk to Self: Suicidal Ideation: Yes-Currently Present Suicidal Intent: No Is patient at risk for suicide?: Yes Suicidal Plan?: No Access to Means:  (UTA) What has been your use of drugs/alcohol within the last 12 months?: UDS is pending. How many times?:  (UTA) Other Self Harm Risks: AH with commands. Intentional Self  Injurious Behavior:  (UTA) Risk to Others: Homicidal Ideation: No Thoughts of Harm to Others: No Current Homicidal Intent: No Current Homicidal Plan: No Access to Homicidal Means:  (UTA) Identified Victim: NA History of harm to others?:  (UTA) Assessment of Violence:  (UTA) Violent Behavior Description: UTA Does patient have access to weapons?:  (UTA) Criminal Charges Pending?:  (UTA) Does patient have a court date:  (UTA) Prior Inpatient Therapy: Prior Inpatient Therapy: Yes Prior Therapy Dates: 07/13/2018-07/19/2018 [facility] Reason for Treatment: Delusional disorder (HCC), Paranoid schizophrenia (HCC). Prior Outpatient Therapy: Prior Outpatient Therapy: No Does patient have an ACCT team?: No Does patient have Intensive In-House Services?  : No Does patient have [facility] services? : No Does patient have P4CC services?: No  Past Medical History:  Past Medical History:  Diagnosis Date  . Paranoid schizophrenia [facility](HCC)     Past Surgical History:  Procedure Laterality Date  . ORIF MANDIBULAR FRACTURE N/A 02/12/2020   Procedure: OPEN REDUCTION INTERNAL FIXATION (ORIF) MANDIBULAR FRACTURE MMF;  Surgeon: Rejeana Brockaldwell, William M, MD;  [facility];  Service: ENT;  Laterality: N/A;   Family History: History reviewed. No pertinent family history. Family Psychiatric  History:  Social History:  Social History   Substance and Sexual Activity  Alcohol Use No     Social History   Substance and Sexual Activity  Drug Use No    Social History   Socioeconomic History  . Marital status: Single    Spouse name: Not on file  . Number of children: Not on file  . Years of education: Not on file  . Highest education level: Not on  file  Occupational History  . Not on file  Tobacco Use  . Smoking status: Current Every Day Smoker    Packs/day: 3.00    Types: Cigarettes  . Smokeless tobacco: Never Used  Vaping Use  . Vaping Use: Never used  Substance  and Sexual Activity  . Alcohol use: No  . Drug use: No  . Sexual activity: Not on file  Other Topics Concern  . Not on file  Social History Narrative   ** Merged History Encounter **       Social Determinants of Health   Financial Resource Strain: Not on file  Food Insecurity: Not on file  Transportation Needs: Not on file  Physical Activity: Not on file  Stress: Not on file  Social Connections: Not on file   Additional Social History:    Allergies:  No Known Allergies  Labs:  Results for orders placed or performed during the hospital encounter of 02/26/20 (from the past 48 hour(s))  Resp Panel by RT-PCR (Flu A&B, Covid) Nasopharyngeal Swab     Status: None   Collection Time: 02/26/20  6:12 PM   Specimen: Nasopharyngeal Swab; Nasopharyngeal(NP) swabs in vial transport medium  Result Value Ref Range   SARS Coronavirus 2 by RT PCR NEGATIVE NEGATIVE    Comment: (NOTE) SARS-CoV-2 target nucleic acids are NOT DETECTED.  The SARS-CoV-2 RNA is generally detectable in upper respiratory specimens during the acute phase of infection. The lowest concentration of SARS-CoV-2 viral copies this assay can detect is 138 copies/mL. A negative result does not preclude SARS-Cov-2 infection and should not be used as the sole basis for treatment or other patient management decisions. A negative result may occur with  improper specimen collection/handling, submission of specimen other than nasopharyngeal swab, presence of viral mutation(s) within the areas targeted by this assay, and inadequate number of viral copies(<138 copies/mL). A negative result must be combined with clinical observations, patient history, and epidemiological information. The expected result is Negative.  Fact Sheet for Patients:  BloggerCourse.com  Fact Sheet for Healthcare Providers:  SeriousBroker.it  This test is no t yet approved or cleared by the [place]  FDA and  has been authorized for detection and/or diagnosis of SARS-CoV-2 by FDA under an Emergency Use Authorization (EUA). This EUA will remain  in effect (meaning this test can be used) for the duration of the COVID-19 declaration under Section 564(b)(1) of the Act, 21 U.S.C.section 360bbb-3(b)(1), unless the authorization is terminated  or revoked sooner.       Influenza A by PCR NEGATIVE NEGATIVE   Influenza B by PCR NEGATIVE NEGATIVE    Comment: (NOTE) The Xpert Xpress SARS-CoV-2/FLU/RSV plus assay is intended as an aid in the diagnosis of influenza from Nasopharyngeal swab specimens and should not be used as a sole basis for treatment. Nasal washings and aspirates are unacceptable for Xpert Xpress SARS-CoV-2/FLU/RSV testing.  Fact Sheet for Patients: BloggerCourse.com  Fact Sheet for Healthcare Providers: SeriousBroker.it  This test is not yet approved or cleared by the [place] FDA and has been authorized for detection and/or diagnosis of SARS-CoV-2 by FDA under an Emergency Use Authorization (EUA). This EUA will remain in effect (meaning this test can be used) for the duration of the COVID-19 declaration under Section 564(b)(1) of the Act, 21 U.S.C. section 360bbb-3(b)(1), unless the authorization is terminated or revoked.  Performed at [facility], [address] [place], [place] [address]   Ethanol     Status: None  Collection Time: 02/26/20  6:56 PM  Result Value Ref Range   Alcohol, Ethyl (B) <10 <10 mg/dL    Comment: (NOTE) Lowest detectable limit for serum alcohol is 10 mg/dL.  For medical purposes only. Performed at [facility], [address], [place], [place] [address]   CBC with Differential/Platelet     Status: Abnormal   Collection Time: 02/26/20  6:56 PM  Result Value Ref Range   WBC 6.2 4.0 - 10.5 K/uL   RBC 4.06 (L) 4.22 - 5.81 MIL/uL   Hemoglobin  11.8 (L) 13.0 - 17.0 g/dL   HCT 61.9 (L) 50.9 - 32.6 %   MCV 89.2 80.0 - 100.0 fL   MCH 29.1 26.0 - 34.0 pg   MCHC 32.6 30.0 - 36.0 g/dL   RDW 71.2 45.8 - 09.9 %   Platelets 256 150 - 400 K/uL   nRBC 0.0 0.0 - 0.2 %   Neutrophils Relative % 34 %   Neutro Abs 2.1 1.7 - 7.7 K/uL   Lymphocytes Relative 55 %   Lymphs Abs 3.4 0.7 - 4.0 K/uL   Monocytes Relative 7 %   Monocytes Absolute 0.4 0.1 - 1.0 K/uL   Eosinophils Relative 4 %   Eosinophils Absolute 0.3 0.0 - 0.5 K/uL   Basophils Relative 0 %   Basophils Absolute 0.0 0.0 - 0.1 K/uL   Immature Granulocytes 0 %   Abs Immature Granulocytes 0.01 0.00 - 0.07 K/uL    Comment: Performed at [facility], [address], [place], [place] [address]  Comprehensive metabolic panel     Status: None   Collection Time: 02/26/20  6:56 PM  Result Value Ref Range   Sodium 139 135 - 145 mmol/L   Potassium 3.8 3.5 - 5.1 mmol/L   Chloride 102 98 - 111 mmol/L   CO2 25 22 - 32 mmol/L   Glucose, Bld 98 70 - 99 mg/dL    Comment: Glucose reference range applies only to samples taken after fasting for at least 8 hours.   BUN 14 6 - 20 mg/dL   Creatinine, Ser 5.05 0.61 - 1.24 mg/dL   Calcium 9.4 8.9 - 39.7 mg/dL   Total Protein 7.0 6.5 - 8.1 g/dL   Albumin 4.1 3.5 - 5.0 g/dL   AST 22 15 - 41 U/L   ALT 25 0 - 44 U/L   Alkaline Phosphatase 118 38 - 126 U/L   Total Bilirubin 0.3 0.3 - 1.2 mg/dL   GFR, Estimated >67 >34 mL/min    Comment: (NOTE) Calculated using the CKD-EPI Creatinine Equation (2021)    Anion gap 12 5 - 15    Comment: Performed at [facility], [address] [place], [place] [address]    Medications:  Current Facility-Administered Medications  Medication Dose Route Frequency Provider Last Rate Last Admin  . amitriptyline (ELAVIL) tablet 10 mg  10 mg Oral QHS Lorre Nick, MD   10 mg at 02/26/20 2223  . ARIPiprazole (ABILIFY) tablet 20 mg  20 mg Oral Daily Lorre Nick, MD   20 mg at  02/27/20 0947  . sertraline (ZOLOFT) tablet 25 mg  25 mg Oral QHS Lorre Nick, MD   25 mg at 02/26/20 2223   Current Outpatient Medications  Medication Sig Dispense Refill  . acetaminophen (TYLENOL) 160 MG/5ML solution Take 31.3 mLs (1,000 mg total) by mouth every 6 (six) hours as needed for mild pain. 120 mL 0  . amitriptyline (ELAVIL) 10 MG  tablet Take 1 tablet (10 mg total) by mouth at bedtime. 30 tablet 0  . ARIPiprazole (ABILIFY) 15 MG tablet Take 15 mg by mouth daily.    . chlorhexidine (PERIDEX) 0.12 % solution Use as directed 15 mLs in the mouth or throat 2 (two) times daily. 120 mL 0  . oxyCODONE (ROXICODONE) 5 MG/5ML solution Take 5 mLs (5 mg total) by mouth every 6 (six) hours as needed for moderate pain. 100 mL 0  . polyethylene glycol (MIRALAX / GLYCOLAX) 17 g packet Take 17 g by mouth daily. 14 each 0  . sertraline (ZOLOFT) 25 MG tablet Take 1 tablet (25 mg total) by mouth at bedtime. 30 tablet 0  . traMADol (ULTRAM) 50 MG tablet Take 1 tablet (50 mg total) by mouth every 6 (six) hours as needed for severe pain. Crush to give medicine. 30 tablet 0  . ARIPiprazole (ABILIFY) 20 MG tablet Take 1 tablet (20 mg total) by mouth daily. (Patient not taking: No sig reported) 90 tablet 1  . methocarbamol (ROBAXIN) 500 MG tablet Take 2 tablets (1,000 mg total) by mouth every 8 (eight) hours as needed for muscle spasms. Crush to give medicine. 30 tablet 0    Musculoskeletal: Strength & Muscle Tone: within normal limits Gait & Station: normal Patient leans: N/A  Psychiatric Specialty Exam: Physical Exam Vitals reviewed.  Psychiatric:        Mood and Affect: Mood normal.     Review of Systems  Blood pressure 125/68, pulse 64, temperature 98 F (36.7 C), temperature source Oral, resp. rate 16, SpO2 99 %.There is no height or weight on file to calculate BMI.  General Appearance: Disheveled  Eye Contact:  Fair  Speech:  Clear and Coherent  Volume:  Normal  Mood:  Anxious and  Depressed  Affect:  Congruent  Thought Process:  Coherent  Orientation:  Full (Time, Place, and Person)  Thought Content:  Logical  Suicidal Thoughts:  Yes.  without intent/plan  Homicidal Thoughts:  No  Memory:  Immediate;   Fair  Judgement:  Fair  Insight:  Fair  Psychomotor Activity:  Normal  Concentration:  Concentration: Fair  Recall:  Good  Fund of Knowledge:  Fair  Language:  Fair  Akathisia:  No  Handed:  Right  AIMS (if indicated):     Assets:  Communication Skills Desire for Improvement Resilience Social Support  ADL's:  Intact  Cognition:  WNL  Sleep:        Treatment Plan Summary: Daily contact with patient to assess and evaluate symptoms and progress in treatment and Medication management   Continue Abilify 20 mg p.o. daily Continue Zoloft 25 mg p.o. daily  Disposition: Recommend psychiatric Inpatient admission when medically cleared.  This service was provided via telemedicine using a 2-way, interactive audio and video technology.  Names of all persons participating in this telemedicine service and their role in this encounter. Name: Finbar Cerros Role: patient   Name: T.Jazae Gandolfi Role: NP           Oneta Rack, NP 02/27/2020 10:59 AM

## 2020-02-27 NOTE — BH Assessment (Signed)
BHH Assessment Progress Note  Per Hillery Jacks, NP, this pt requires psychiatric hospitalization at this time.  Tosin, RN, AC has assigned pt to [facility] Rm 405-2; [facility] will be ready to receive pt at 16:30.  Pt has signed Voluntary Admission and Consent for Treatment, as well as Consent to Release Information to his drug court representative and to his probation officer, and signed forms have been faxed to [facility].  EDP's Linwood Dibbles, MD and Lorre Nick, MD and pt's nurse, Abby, have been notified, and Abby agrees to send original paperwork along with pt via [employer], and to call report to [phone].  Doylene Canning, [place] Behavioral Health Coordinator [phone]

## 2020-02-27 NOTE — Plan of Care (Signed)
Nurse discussed anxiety, depression and coping skills with patient.  

## 2020-02-27 NOTE — BH Assessment (Signed)
Tele Assessment Note   Patient Name: Jeremy Taylor MRN: [id number] Referring Physician: Dr. Lorre Nick. Location of Patient: [facility] ED, [phone].  Location of Provider: Behavioral Health TTS Department  Jeremy Taylor is an 19 y.o. male, who presents voluntary and unaccompanied to [facility]. Pt was a poor historian and answered very few question during the assessment. Clinician asked the pt, "what brought you to the hospital?" Pt reported, suicidal thoughts on and off for two weeks.  Clinician asked the pt to described his suicidal thoughts, pt replied "hurt myself not intentionally." Pt reported, he was homicidal (he wanted to hurt and kill someone.) Pt then reported, he does not want to hurt anyone but himself. Pt reported, hearing voices telling him to hurt himself and others.   Clinician was unable to assess the following: previous suicide attempts, stressors, history of abuse, self-injurious behaviors, access to weapons, anxiety, impulse control, legal involvement, history of violence, symptoms of depression, contract for safety, substance use.   Pt reported, his PCP prescribes his Abilify. Pt has previous inpatient admission to [facility] in May 2020.   Pt presents sleeping under covers with slow speech. Clinician had to re-engage the pt many times by calling his name however he did not answer. Pt's eye contact was poor. Pt's mood was sad. Pt's affect was flat. Pt's thought process was circumstantial. Pt's judgment was impaired. Pt's concentration and insight are poor.   *Clinician asked the pt if he had family, friend supports however the pt did not respond. Clinician did not received consent to contact supports to obtain additional information.*  Diagnosis: Paranoid schizophrenia (HCC).  Past Medical History:  Past Medical History:  Diagnosis Date  . Paranoid schizophrenia [facility])     Past Surgical History:  Procedure Laterality Date  . ORIF MANDIBULAR FRACTURE N/A 02/12/2020    Procedure: OPEN REDUCTION INTERNAL FIXATION (ORIF) MANDIBULAR FRACTURE [facility];  Surgeon: Rejeana Brock, MD;  Location: [facility] OR;  Service: ENT;  Laterality: N/A;    Family History: History reviewed. No pertinent family history.  Social History:  reports that he has been smoking cigarettes. He has been smoking about 3.00 packs per day. He has never used smokeless tobacco. He reports that he does not drink alcohol and does not use drugs.  Additional Social History:  Alcohol / Drug Use Pain Medications: See MAR Prescriptions: See MAR Over the Counter: See MAR History of alcohol / drug use?: Yes Substance #1 Name of Substance 1: Marijuana. 1 - Age of First Use: UTA 1 - Amount (size/oz): Pt admits to marijuana use in EDP note. Pt's UDS is pending. 1 - Frequency: UTA 1 - Duration: UTA 1 - Last Use / Amount: UTA  CIWA: CIWA-Ar BP: 121/68 Pulse Rate: 89 COWS:    Allergies: No Known Allergies  Home Medications: (Not in a hospital admission)   OB/GYN Status:  No LMP for male patient.  General Assessment Data Location of Assessment: [facility] ED TTS Assessment: In system Is this a Tele or Face-to-Face Assessment?: Tele Assessment Is this an Initial Assessment or a Re-assessment for this encounter?: Initial Assessment Patient Accompanied by:: N/A Language Other than English: No Living Arrangements: Other (Comment) ("Someone.") What gender do you identify as?: Male Date Telepsych consult ordered in [facility]: 02/26/20 Time Telepsych consult ordered in [facility]: 1812 Living Arrangements: Other (Comment) ("Someone.") Can pt return to current living arrangement?:  (UTA) Admission Status: Voluntary Is patient capable of signing voluntary admission?: Yes Referral Source: Other (UTA) Insurance type: Medicaid.  Crisis Care Plan Living Arrangements: Other (Comment) ("Someone.") Legal Guardian: Other: (Self.) Name of Psychiatrist: Primary Care Provider. Name of Therapist: NA  Education  Status Is patient currently in school?:  (UTA)  Risk to self with the past 6 months Suicidal Ideation: Yes-Currently Present Has patient been a risk to self within the past 6 months prior to admission? : No Suicidal Intent: No Has patient had any suicidal intent within the past 6 months prior to admission? : No Is patient at risk for suicide?: Yes Suicidal Plan?: No Has patient had any suicidal plan within the past 6 months prior to admission? : No Access to Means:  (UTA) What has been your use of drugs/alcohol within the last 12 months?: UDS is pending. Previous Attempts/Gestures:  (UTA) How many times?:  (UTA) Other Self Harm Risks: AH with commands. Intentional Self Injurious Behavior:  (UTA) Family Suicide History: Unable to assess Recent stressful life event(s): Other (Comment) (UTA) Persecutory voices/beliefs?:  Rich Reining) Depression:  (UTA) Substance abuse history and/or treatment for substance abuse?: Yes Suicide prevention information given to non-admitted patients: Not applicable  Risk to Others within the past 6 months Homicidal Ideation: No Does patient have any lifetime risk of violence toward others beyond the six months prior to admission? :  (UTA) Thoughts of Harm to Others: No Current Homicidal Intent: No Current Homicidal Plan: No Access to Homicidal Means:  (UTA) Identified Victim: NA History of harm to others?:  (UTA) Assessment of Violence:  (UTA) Violent Behavior Description: UTA Does patient have access to weapons?:  (UTA) Criminal Charges Pending?:  (UTA) Does patient have a court date:  (UTA) Is patient on probation?:  (UTA)  Psychosis Hallucinations: Auditory Delusions: None noted  Mental Status Report Appearance/Hygiene: Other (Comment) (Pt under covers.) Eye Contact: Poor Speech: Soft Level of Consciousness: Sleeping Mood: Sad Affect: Flat Anxiety Level:  (UTA) Thought Processes: Circumstantial Judgement: Impaired Obsessive Compulsive  Thoughts/Behaviors: Unable to Assess  Cognitive Functioning Concentration: Poor Memory: Recent Impaired Is patient IDD: No Insight: Poor Impulse Control: Unable to Assess Appetite:  (UTA) Sleep: Unable to Assess Vegetative Symptoms: Unable to Assess  ADLScreening [facility] Assessment Services) Patient's cognitive ability adequate to safely complete daily activities?: Yes Patient able to express need for assistance with ADLs?: Yes Independently performs ADLs?: Yes (appropriate for developmental age) (Per chart.)  Prior Inpatient Therapy Prior Inpatient Therapy: Yes Prior Therapy Dates: 07/13/2018-07/19/2018 Prior Therapy Facilty/Provider(s): [facility]. Reason for Treatment: Delusional disorder (HCC), Paranoid schizophrenia (HCC).  Prior Outpatient Therapy Prior Outpatient Therapy: No Does patient have an ACCT team?: No Does patient have Intensive In-House Services?  : No Does patient have [facility] services? : No Does patient have P4CC services?: No  ADL Screening (condition at time of admission) Patient's cognitive ability adequate to safely complete daily activities?: Yes Is the patient deaf or have difficulty hearing?: No Does the patient have difficulty seeing, even when wearing glasses/contacts?: No Does the patient have difficulty concentrating, remembering, or making decisions?: Yes Patient able to express need for assistance with ADLs?: Yes Does the patient have difficulty dressing or bathing?: No Independently performs ADLs?: Yes (appropriate for developmental age) (Per chart.) Does the patient have difficulty walking or climbing stairs?: No (Per chart.) Weakness of Legs:  (UTA) Weakness of Arms/Hands:  (UTA)  Home Assistive Devices/Equipment Home Assistive Devices/Equipment:  (UTA)    Abuse/Neglect Assessment (Assessment to be complete while patient is alone) Abuse/Neglect Assessment Can Be Completed: Unable to assess, patient is non-responsive or altered mental  status  Advance Directives (For Healthcare) Does Patient Have a Medical Advance Directive?: No Would patient like information on creating a medical advance directive?: No - Patient declined    Disposition: Otila Back, PA-C recommends overnight observation and to be reassessed by psychiatry. Disposition discussed with Milagros Loll, RN via secure chat in [place]. RN to discuss disposition with EDP.   Disposition Initial Assessment Completed for this Encounter: Yes  This service was provided via telemedicine using a 2-way, interactive audio and video technology.  Names of all persons participating in this telemedicine service and their role in this encounter. Name: Jeremy Taylor. Role: Patient.  Name: Redmond Pulling, MS, [facility], [facility]. Role: Counselor.   Name: Eveline Keto, LCAS-A. Role: Counselor       Redmond Pulling 02/27/2020 12:46 AM     Redmond Pulling, MS, [facility], [facility] : [facility] Triage Specialist [phone]

## 2020-02-27 NOTE — Discharge Planning (Signed)
Yousif Edelson J. Lucretia Roers, RN, BSN, [place] [phone]  RNCM set up appointment with [facility] on 1/16 @ 0930.  Placed appointment date and time on pt After Visit Summary (AVS) and advised to please arrive 15 min early and take a picture ID and your current medications.

## 2020-02-27 NOTE — ED Notes (Signed)
Pt eating breakfast tray 

## 2020-02-27 NOTE — Tx Team (Signed)
Initial Treatment Plan 02/27/2020 8:09 PM Jeremy Taylor ZOX:[id number]    PATIENT STRESSORS: Educational concerns Financial difficulties Health problems Marital or family conflict Occupational concerns Traumatic event   PATIENT STRENGTHS: Ability for insight Capable of independent living Wellsite geologist fund of knowledge Motivation for treatment/growth Supportive family/friends   PATIENT IDENTIFIED PROBLEMS: "anxiety"  "depression"  "health problems"  "place to live"               DISCHARGE CRITERIA:  Ability to meet basic life and health needs Adequate post-discharge living arrangements Improved stabilization in mood, thinking, and/or behavior Medical problems require only outpatient monitoring Motivation to continue treatment in a less acute level of care Need for constant or close observation no longer present Reduction of life-threatening or endangering symptoms to within safe limits Safe-care adequate arrangements made Verbal commitment to aftercare and medication compliance  PRELIMINARY DISCHARGE PLAN: Attend aftercare/continuing care group Attend PHP/IOP Placement in alternative living arrangements Return to previous work or school arrangements  PATIENT/FAMILY INVOLVEMENT: This treatment plan has been presented to and reviewed with the patient, Jeremy Taylor..  The patient and family have been given the opportunity to ask questions and make suggestions.  Jeremy Taylor [place], [place] 02/27/2020, 8:09 PM

## 2020-02-27 NOTE — ED Notes (Signed)
Pt calm and cooperative; compliant with taking medications.

## 2020-02-28 DIAGNOSIS — F4321 Adjustment disorder with depressed mood: Secondary | ICD-10-CM | POA: Diagnosis present

## 2020-02-28 DIAGNOSIS — F29 Unspecified psychosis not due to a substance or known physiological condition: Secondary | ICD-10-CM | POA: Insufficient documentation

## 2020-02-28 LAB — LIPID PANEL
Cholesterol: 138 mg/dL (ref 0–200)
HDL: 34 mg/dL — ABNORMAL LOW (ref >40)
LDL Cholesterol: 95 mg/dL (ref 0–99)
Total CHOL/HDL Ratio: 4.1 RATIO
Triglycerides: 46 mg/dL (ref <150)
VLDL: 9 mg/dL (ref 0–40)

## 2020-02-28 LAB — TSH: TSH: 0.221 u[IU]/mL — ABNORMAL LOW (ref 0.350–4.500)

## 2020-02-28 LAB — HEMOGLOBIN A1C
Hgb A1c MFr Bld: 5 % (ref 4.8–5.6)
Mean Plasma Glucose: 96.8 mg/dL

## 2020-02-28 MED ORDER — ENSURE ENLIVE PO LIQD
237.0000 mL | Freq: Two times a day (BID) | ORAL | Status: DC
  Administered 2020-02-28 – 2020-03-01 (×5): 237 mL via ORAL
  Filled 2020-02-28 (×6): qty 237

## 2020-02-28 MED ORDER — ARIPIPRAZOLE 2 MG PO TABS
15.0000 mg | ORAL_TABLET | Freq: Every day | ORAL | Status: DC
  Filled 2020-02-28: qty 8

## 2020-02-28 MED ORDER — ARIPIPRAZOLE 2 MG PO TABS
20.0000 mg | ORAL_TABLET | Freq: Every day | ORAL | Status: DC
  Filled 2020-02-28: qty 10

## 2020-02-28 NOTE — Progress Notes (Signed)
Pt presents with flat affect and depressed mood. Pt is guarded and it is hard for RN to get pt to open up and discuss feelings/concerns.  Pt denies SI/HI/AVH.  Pt took medications without incident.  Pt isolative to his room and stayed to himself.  RN will continue to monitor and provide support as indicated.

## 2020-02-28 NOTE — Progress Notes (Signed)
NUTRITION ASSESSMENT  Pt identified as at risk on the Malnutrition Screen Tool  INTERVENTION: 1. Supplements: Ensure Plus po BID, each supplement provides 350 kcal and 13 grams of protein   NUTRITION DIAGNOSIS: Unintentional weight loss related to sub-optimal intake as evidenced by pt report.   Goal: Pt to meet >/= 90% of their estimated nutrition needs.  Monitor:  PO intake  Assessment:  Pt admitted for SI. Pt reports being homeless. Reporting decreased appetite. Per weight records, pt has lost 10 lbs since 12/4 (6% wt loss x <1 month, significant for time frame). Will order Ensure supplements.    Height: Ht Readings from Last 1 Encounters:  02/27/20 5\' 9"  (1.753 m)    Weight: Wt Readings from Last 1 Encounters:  02/27/20 61.2 kg    Weight Hx: Wt Readings from Last 10 Encounters:  02/27/20 61.2 kg  02/11/20 66.1 kg  10/06/19 73 kg  08/05/18 59.9 kg  07/13/18 59 kg  07/02/16 63.5 kg (35 %, Z= -0.39)*  03/09/15 62.8 kg (46 %, Z= -0.11)*   * Growth percentiles are based on CDC (Boys, 2-20 Years) data.    BMI:  Body mass index is 19.94 kg/m. Pt meets criteria for normal based on current BMI.  Estimated Nutritional Needs: Kcal: 25-30 kcal/kg Protein: > 1 gram protein/kg Fluid: 1 ml/kcal  Diet Order:  Diet Order    None     Pt is also offered choice of unit snacks mid-morning and mid-afternoon.  Pt is eating as desired.   Lab results and medications reviewed.   03/11/15, MS, RD, LDN Inpatient Clinical Dietitian Contact information available via Amion

## 2020-02-28 NOTE — BHH Group Notes (Signed)
Adult Psychoeducational Group Note  Date:  02/28/2020 Time:  4:25 PM  Group Topic/Focus:  Self Care:   The focus of this group is to help patients understand the importance of self-care in order to improve or restore emotional, physical, spiritual, interpersonal, and financial health.  Participation Level:  Did Not Attend  Margaret Pyle 02/28/2020, 4:25 PM

## 2020-02-28 NOTE — BHH Suicide Risk Assessment (Signed)
BHH INPATIENT:  Family/Significant Other Suicide Prevention Education  Suicide Prevention Education:  Contact Attempts: Wendie Agreste [phone] (Mother)  has been identified by the patient as the family member/significant other with whom the patient will be residing, and identified as the person(s) who will aid the patient in the event of a mental health crisis.  With written consent from the patient, two attempts were made to provide suicide prevention education, prior to and/or following the patient's discharge.  We were unsuccessful in providing suicide prevention education.  A suicide education pamphlet was given to the patient to share with family/significant other.  Date and time of first attempt: 02/28/20 at 3:40pm.  Message states that call cannot be completed at this time.  CSW completed SPE with patient. Pamphlet was placed in patient's chart.    Aram Beecham 02/28/2020, 3:38 PM
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
## 2020-02-28 NOTE — Progress Notes (Signed)
Recreation Therapy Notes  Animal-Assisted Activity (AAA) Program Checklist/Progress Notes Patient Eligibility Criteria Checklist & Daily Group note for Rec Tx Intervention  Date: 12.21.21 Time: 1430 Location: [address]   AAA/T Program Assumption of Risk Form signed by Engineer, production or Parent Legal Guardian YES   Patient is free of allergies or severe asthma YES  Patient reports no fear of animals YES  Patient reports no history of cruelty to animals YES   Patient understands his/her participation is voluntary YES   Patient washes hands before animal contact YES  Patient washes hands after animal contact YES  Education: Charity fundraiser, Appropriate Animal Interaction   Education Outcome: Acknowledges understanding/In group clarification offered/Needs additional education.   Clinical Observations/Feedback: Pt did not attend group activity.    Caroll Rancher, LRT/CTRS         Caroll Rancher A 02/28/2020 3:47 PM

## 2020-02-28 NOTE — Progress Notes (Signed)
D: Patient presents with flat and depressed affect. Patient had no complaints or concerns at time of assessment. Patient denies SI/HI at this time. Patient also denies AH/VH at this time. Patient contracts for safety and still remains isolative to his room at this time.  A: Provided positive reinforcement and encouragement.  R: Patient cooperative and receptive to efforts. Patient remains safe on the unit.   02/28/20 2104  Psych Admission Type (Psych Patients Only)  Admission Status Voluntary  Psychosocial Assessment  Patient Complaints None  Eye Contact Brief  Facial Expression Sad  Affect Sad;Depressed  Speech Logical/coherent  Interaction Guarded  Motor Activity Other (Comment) (WDL)  Appearance/Hygiene In scrubs  Behavior Characteristics Cooperative;Appropriate to situation  Mood Anxious  Thought Process  Coherency WDL  Content WDL  Delusions None reported or observed  Perception WDL  Hallucination None reported or observed  Judgment Poor  Confusion None  Danger to Self  Current suicidal ideation? Denies  Danger to Others  Danger to Others None reported or observed
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
## 2020-02-28 NOTE — BHH Group Notes (Addendum)
[facility] Group Notes:  (Nursing)  Date:  02/28/2020  Time:  1030 Type of Therapy: Nurse Education  Participation Level:  Did Not Attend  Summary of Progress/Problems: Pt refused scheduled group despite multiple attempts.  Sherryl Manges 02/28/2020,1030

## 2020-02-29 DIAGNOSIS — F251 Schizoaffective disorder, depressive type: Secondary | ICD-10-CM

## 2020-02-29 LAB — T4, FREE: Free T4: 0.75 ng/dL (ref 0.61–1.12)

## 2020-02-29 MED ORDER — ARIPIPRAZOLE 2 MG PO TABS
15.0000 mg | ORAL_TABLET | Freq: Every day | ORAL | Status: DC
  Filled 2020-02-29: qty 8

## 2020-02-29 MED ORDER — ARIPIPRAZOLE 15 MG PO TABS
15.0000 mg | ORAL_TABLET | Freq: Every day | ORAL | Status: DC
  Administered 2020-02-29 – 2020-03-01 (×2): 15 mg via ORAL
  Filled 2020-02-29 (×4): qty 1

## 2020-02-29 NOTE — Progress Notes (Signed)
   02/29/20 1012  Vital Signs  Temp 99.2 F (37.3 C)  Temp Source Oral  Pulse Rate 69  Pulse Rate Source Dinamap  Resp 16  BP 125/73  BP Location Left Arm  BP Method Automatic  Patient Position (if appropriate) Sitting  Oxygen Therapy  SpO2 100 %   D: Patient denies SI/HI/AVH. Pt. Denies anxiety. This morning at the med window pt. Has beads of sweat on his forehead and skin felt warm to the touch. Took the above vital signs. Pt. Asked "What time is supper?" Pt. Was asked if he missed breakfast and pt. Acknowledged that he did.  Pt. Isolated in his room.  A:  Patient took scheduled medicine.  Support and encouragement provided Routine safety checks conducted every 15 minutes. Patient  Informed to notify staff with any concerns.   R: Safety maintained.

## 2020-02-29 NOTE — Tx Team (Signed)
Interdisciplinary Treatment and Diagnostic Plan Update  02/29/2020 Time of Session: 9:20am Ruven SUNNY GAINS MRN: [id number]  Principal Diagnosis: Schizophrenia spectrum disorder with psychotic disorder type not yet determined [facility])  Secondary Diagnoses: Principal Problem:   Schizophrenia spectrum disorder with psychotic disorder type not yet determined (HCC) Active Problems:   Grief   Current Medications:  Current Facility-Administered Medications  Medication Dose Route Frequency Provider Last Rate Last Admin  . acetaminophen (TYLENOL) tablet 650 mg  650 mg Oral Q6H PRN Oneta Rack, NP      . alum & mag hydroxide-simeth (MAALOX/MYLANTA) 200-200-20 MG/5ML suspension 30 mL  30 mL Oral Q4H PRN Oneta Rack, NP      . ARIPiprazole (ABILIFY) tablet 15 mg  15 mg Oral QHS Antonieta Pert, MD      . feeding supplement (ENSURE ENLIVE / ENSURE PLUS) liquid 237 mL  237 mL Oral BID BM Antonieta Pert, MD   237 mL at 02/29/20 1007  . ziprasidone (GEODON) injection 20 mg  20 mg Intramuscular Q12H PRN Oneta Rack, NP       And  . LORazepam (ATIVAN) tablet 1 mg  1 mg Oral PRN Oneta Rack, NP      . magnesium hydroxide (MILK OF MAGNESIA) suspension 30 mL  30 mL Oral Daily PRN Oneta Rack, NP      . sertraline (ZOLOFT) tablet 25 mg  25 mg Oral Daily Oneta Rack, NP   25 mg at 02/29/20 1007  . traZODone (DESYREL) tablet 50 mg  50 mg Oral QHS PRN Oneta Rack, NP   50 mg at 02/28/20 2104   PTA Medications: Medications Prior to Admission  Medication Sig Dispense Refill Last Dose  . acetaminophen (TYLENOL) 160 MG/5ML solution Take 31.3 mLs (1,000 mg total) by mouth every 6 (six) hours as needed for mild pain. 120 mL 0   . amitriptyline (ELAVIL) 10 MG tablet Take 1 tablet (10 mg total) by mouth at bedtime. 30 tablet 0   . ARIPiprazole (ABILIFY) 15 MG tablet Take 15 mg by mouth daily.     . ARIPiprazole (ABILIFY) 20 MG tablet Take 1 tablet (20 mg total) by mouth daily.  (Patient not taking: No sig reported) 90 tablet 1   . chlorhexidine (PERIDEX) 0.12 % solution Use as directed 15 mLs in the mouth or throat 2 (two) times daily. 120 mL 0   . methocarbamol (ROBAXIN) 500 MG tablet Take 2 tablets (1,000 mg total) by mouth every 8 (eight) hours as needed for muscle spasms. Crush to give medicine. 30 tablet 0   . oxyCODONE (ROXICODONE) 5 MG/5ML solution Take 5 mLs (5 mg total) by mouth every 6 (six) hours as needed for moderate pain. 100 mL 0   . polyethylene glycol (MIRALAX / GLYCOLAX) 17 g packet Take 17 g by mouth daily. 14 each 0   . sertraline (ZOLOFT) 25 MG tablet Take 1 tablet (25 mg total) by mouth at bedtime. 30 tablet 0   . traMADol (ULTRAM) 50 MG tablet Take 1 tablet (50 mg total) by mouth every 6 (six) hours as needed for severe pain. Crush to give medicine. 30 tablet 0     Patient Stressors: Educational concerns Financial difficulties Health problems Marital or family conflict Occupational concerns Traumatic event  Patient Strengths: Ability for insight Capable of independent living Communication skills General fund of knowledge Motivation for treatment/growth Supportive family/friends  Treatment Modalities: Medication Management, Group therapy, Case management,  1 to  1 session with clinician, Psychoeducation, Recreational therapy.   Physician Treatment Plan for Primary Diagnosis: Schizophrenia spectrum disorder with psychotic disorder type not yet determined ([place]) Long Term Goal(s): Improvement in symptoms so as ready for discharge Improvement in symptoms so as ready for discharge   Short Term Goals: Ability to identify changes in lifestyle to reduce recurrence of condition will improve Ability to verbalize feelings will improve Ability to disclose and discuss suicidal ideas Ability to demonstrate self-control will improve Ability to identify and develop effective coping behaviors will improve Compliance with prescribed medications will  improve Ability to identify triggers associated with substance abuse/mental health issues will improve Ability to verbalize feelings will improve Ability to identify and develop effective coping behaviors will improve  Medication Management: Evaluate patient's response, side effects, and tolerance of medication regimen.  Therapeutic Interventions: 1 to 1 sessions, Unit Group sessions and Medication administration.  Evaluation of Outcomes: Not Met  Physician Treatment Plan for Secondary Diagnosis: Principal Problem:   Schizophrenia spectrum disorder with psychotic disorder type not yet determined ([place]) Active Problems:   Grief  Long Term Goal(s): Improvement in symptoms so as ready for discharge Improvement in symptoms so as ready for discharge   Short Term Goals: Ability to identify changes in lifestyle to reduce recurrence of condition will improve Ability to verbalize feelings will improve Ability to disclose and discuss suicidal ideas Ability to demonstrate self-control will improve Ability to identify and develop effective coping behaviors will improve Compliance with prescribed medications will improve Ability to identify triggers associated with substance abuse/mental health issues will improve Ability to verbalize feelings will improve Ability to identify and develop effective coping behaviors will improve     Medication Management: Evaluate patient's response, side effects, and tolerance of medication regimen.  Therapeutic Interventions: 1 to 1 sessions, Unit Group sessions and Medication administration.  Evaluation of Outcomes: Not Met   RN Treatment Plan for Primary Diagnosis: Schizophrenia spectrum disorder with psychotic disorder type not yet determined ([place]) Long Term Goal(s): Knowledge of disease and therapeutic regimen to maintain health will improve  Short Term Goals: Ability to remain free from injury will improve, Ability to demonstrate self-control and  Ability to verbalize feelings will improve  Medication Management: RN will administer medications as ordered by provider, will assess and evaluate patient's response and provide education to patient for prescribed medication. RN will report any adverse and/or side effects to prescribing provider.  Therapeutic Interventions: 1 on 1 counseling sessions, Psychoeducation, Medication administration, Evaluate responses to treatment, Monitor vital signs and CBGs as ordered, Perform/monitor CIWA, COWS, AIMS and Fall Risk screenings as ordered, Perform wound care treatments as ordered.  Evaluation of Outcomes: Not Met   LCSW Treatment Plan for Primary Diagnosis: Schizophrenia spectrum disorder with psychotic disorder type not yet determined ([place]) Long Term Goal(s): Safe transition to appropriate next level of care at discharge, Engage patient in therapeutic group addressing interpersonal concerns.  Short Term Goals: Engage patient in aftercare planning with referrals and resources, Increase social support and Increase ability to appropriately verbalize feelings  Therapeutic Interventions: Assess for all discharge needs, 1 to 1 time with Social worker, Explore available resources and support systems, Assess for adequacy in community support network, Educate family and significant other(s) on suicide prevention, Complete Psychosocial Assessment, Interpersonal group therapy.  Evaluation of Outcomes: Not Met   Progress in Treatment: Attending groups: No. Participating in groups: No. Taking medication as prescribed: Yes. Toleration medication: Yes. Family/Significant other contact made: No, will contact:  Mother's number  was disconnected Patient understands diagnosis: No. Discussing patient identified problems/goals with staff: No. Medical problems stabilized or resolved: No. Denies suicidal/homicidal ideation: Yes. Issues/concerns per patient self-inventory: No. Other:   New problem(s) identified:  No, Describe:  None  New Short Term/Long Term Goal(s):medication stabilization, elimination of SI thoughts, development of comprehensive mental wellness plan.  Patient Goals:  Pt did not attend treatment team due to sleeping.   Discharge Plan or Barriers: Patient recently admitted. CSW will continue to follow and assess for appropriate referrals and possible discharge planning.  Reason for Continuation of Hospitalization: Depression Medication stabilization Suicidal ideation  Estimated Length of Stay: 3-5 days Attendees: Patient: Jeremy Taylor 02/29/2020   Physician: Dr. Claris Gower 02/29/2020   Nursing:  02/29/2020   RN Care Manager: 02/29/2020   Social Worker: Toney Reil, [place] 02/29/2020   Recreational Therapist:  02/29/2020   Other:  02/29/2020   Other:  02/29/2020   Other: 02/29/2020     Scribe for Treatment Team: Mliss Fritz, [place] 02/29/2020 10:44 AM

## 2020-02-29 NOTE — [facility] Group Notes (Signed)
[facility] LCSW Group Therapy  02/29/2020 3:02 PM  Type of Therapy:  Coping Skills and Emotion Regulation  Participation Level:  Did Not Attend  Participation Quality:  Did not attend  Affect:  Did not attend  Cognitive:  Did not attend  Insight:  Did not attend  Engagement in Therapy:  Did not attend  Modes of Intervention:  Activity, Discussion and Did not attend   Summary of Progress/Problems: Patient did not attend the group  Jeremy Taylor 02/29/2020, 3:02 PM

## 2020-02-29 NOTE — Progress Notes (Signed)
[facility] MD Progress Note  02/29/2020 11:30 AM Jeremy Taylor  MRN:  [id number] Subjective:  Patient reports that he slept well last night but he continues to have his headache. Patient is also reporting that his appetite is improving. Patient reports that he does not feel as overly sedated today and has not noticed any negative side effects from his Abilify being decreased to 15mg  nor the addition of Zoloft. Patient reports that his mood is "good" this AM. Patient contracts for safety this AM and does not endorse AVH today nor HI.  Principal Problem: Schizophrenia spectrum disorder with psychotic disorder type not yet determined (HCC) Diagnosis: Principal Problem:   Schizophrenia spectrum disorder with psychotic disorder type not yet determined (HCC) Active Problems:   Grief  Total Time spent with patient: 15 minutes  Past Psychiatric History: See H&P  Past Medical History:  Past Medical History:  Diagnosis Date  . Anxiety   . Depression   . Paranoid schizophrenia [facility])     Past Surgical History:  Procedure Laterality Date  . ORIF MANDIBULAR FRACTURE N/A 02/12/2020   Procedure: OPEN REDUCTION INTERNAL FIXATION (ORIF) MANDIBULAR FRACTURE [facility];  Surgeon: 14/07/2019, MD;  [facility];  Service: ENT;  Laterality: N/A;   Family History: History reviewed. No pertinent family history. Family Psychiatric  History: See H&P Social History:  Social History   Substance and Sexual Activity  Alcohol Use No     Social History   Substance and Sexual Activity  Drug Use No    Social History   Socioeconomic History  . Marital status: Single    Spouse name: Not on file  . Number of children: Not on file  . Years of education: Not on file  . Highest education level: Not on file  Occupational History  . Not on file  Tobacco Use  . Smoking status: Never Smoker  . Smokeless tobacco: Never Used  Vaping Use  . Vaping Use: Never used  Substance and Sexual Activity  . Alcohol  use: No  . Drug use: No  . Sexual activity: Not Currently    Birth control/protection: None  Other Topics Concern  . Not on file  Social History Narrative   ** Merged History Encounter **       Social Determinants of Health   Financial Resource Strain: Not on file  Food Insecurity: Not on file  Transportation Needs: Not on file  Physical Activity: Not on file  Stress: Not on file  Social Connections: Not on file   Additional Social History:    Pain Medications: see MAR Prescriptions: see MAR Over the Counter: see MAR History of alcohol / drug use?: No history of alcohol / drug abuse Longest period of sobriety (when/how long): denied drugs/alcohol use Negative Consequences of Use:  (denied substance abuse)                    Sleep: Good  Appetite:  Fair  Current Medications: Current Facility-Administered Medications  Medication Dose Route Frequency Provider Last Rate Last Admin  . acetaminophen (TYLENOL) tablet 650 mg  650 mg Oral Q6H PRN [facility], NP      . alum & mag hydroxide-simeth (MAALOX/MYLANTA) 200-200-20 MG/5ML suspension 30 mL  30 mL Oral Q4H PRN 09-16-2000, NP      . ARIPiprazole (ABILIFY) tablet 15 mg  15 mg Oral QHS Oneta Rack Jola Babinski, MD      . feeding supplement (ENSURE ENLIVE / ENSURE PLUS)  liquid 237 mL  237 mL Oral BID BM Antonieta Pertlary, Greg Lawson, MD   237 mL at 02/29/20 1007  . ziprasidone (GEODON) injection 20 mg  20 mg Intramuscular Q12H PRN Oneta RackLewis, Tanika N, NP       And  . LORazepam (ATIVAN) tablet 1 mg  1 mg Oral PRN Oneta RackLewis, Tanika N, NP      . magnesium hydroxide (MILK OF MAGNESIA) suspension 30 mL  30 mL Oral Daily PRN Oneta RackLewis, Tanika N, NP      . sertraline (ZOLOFT) tablet 25 mg  25 mg Oral Daily Oneta RackLewis, Tanika N, NP   25 mg at 02/29/20 1007  . traZODone (DESYREL) tablet 50 mg  50 mg Oral QHS PRN Oneta RackLewis, Tanika N, NP   50 mg at 02/28/20 2104    Lab Results:  Results for orders placed or performed during the hospital encounter of  02/27/20 (from the past 48 hour(s))  TSH     Status: Abnormal   Collection Time: 02/28/20  6:30 AM  Result Value Ref Range   TSH 0.221 (L) 0.350 - 4.500 uIU/mL    Comment: Performed by a 3rd Generation assay with a functional sensitivity of <=0.01 uIU/mL. Performed at [facility], [address] [place], [place] [address]   Hemoglobin A1c     Status: None   Collection Time: 02/28/20  6:30 AM  Result Value Ref Range   Hgb A1c MFr Bld 5.0 4.8 - 5.6 %    Comment: (NOTE) Pre diabetes:          5.7%-6.4%  Diabetes:              >6.4%  Glycemic control for   <7.0% adults with diabetes    Mean Plasma Glucose 96.8 mg/dL    Comment: Performed at [facility], [address] [place], [place] [address]  Lipid panel     Status: Abnormal   Collection Time: 02/28/20  6:30 AM  Result Value Ref Range   Cholesterol 138 0 - 200 mg/dL   Triglycerides 46 <098<150 mg/dL   HDL 34 (L) >11>40 mg/dL   Total CHOL/HDL Ratio 4.1 RATIO   VLDL 9 0 - 40 mg/dL   LDL Cholesterol 95 0 - 99 mg/dL    Comment:        Total Cholesterol/HDL:CHD Risk Coronary Heart Disease Risk Table                     Men   Women  1/2 Average Risk   3.4   3.3  Average Risk       5.0   4.4  2 X Average Risk   9.6   7.1  3 X Average Risk  23.4   11.0        Use the calculated Patient Ratio above and the CHD Risk Table to determine the patient's CHD Risk.        ATP III CLASSIFICATION (LDL):  <100     mg/dL   Optimal  [id number]  mg/dL   Near or Above                    Optimal  130-159  mg/dL   Borderline  [id number]  mg/dL   High  >086>190     mg/dL   Very High Performed at [facility], [address] [place], [place] [address]   T4, free     Status: None   Collection  Time: 02/29/20  6:40 AM  Result Value Ref Range   Free T4 0.75 0.61 - 1.12 ng/dL    Comment: (NOTE) Biotin ingestion may interfere with free T4 tests. If the results are inconsistent with the TSH level, previous test  results, or the clinical presentation, then consider biotin interference. If needed, order repeat testing after stopping biotin. Performed at [facility], [address] [place], [place] [address]     Blood Alcohol level:  Lab Results  Component Value Date   [facility] <10 02/26/2020   ETH <10 02/10/2020    Metabolic Disorder Labs: Lab Results  Component Value Date   HGBA1C 5.0 02/28/2020   MPG 96.8 02/28/2020   No results found for: PROLACTIN Lab Results  Component Value Date   CHOL 138 02/28/2020   TRIG 46 02/28/2020   HDL 34 (L) 02/28/2020   CHOLHDL 4.1 02/28/2020   VLDL 9 02/28/2020   LDLCALC 95 02/28/2020   LDLCALC 97 10/06/2019    Physical Findings: AIMS: Facial and Oral Movements Muscles of Facial Expression: None, normal Lips and Perioral Area: None, normal Jaw: None, normal Tongue: None, normal,Extremity Movements Upper (arms, wrists, hands, fingers): None, normal Lower (legs, knees, ankles, toes): None, normal, Trunk Movements Neck, shoulders, hips: None, normal, Overall Severity Severity of abnormal movements (highest score from questions above): None, normal Incapacitation due to abnormal movements: None, normal Patient's awareness of abnormal movements (rate only patient's report): No Awareness, Dental Status Current problems with teeth and/or dentures?: Yes Does patient usually wear dentures?: No  CIWA:  CIWA-Ar Total: 3 COWS:  COWS Total Score: 2  Musculoskeletal: Strength & Muscle Tone: within normal limits Gait & Station: Remained in bed on exam today Patient leans: N/A  Psychiatric Specialty Exam: Physical Exam HENT:     Head: Normocephalic.  Pulmonary:     Effort: Pulmonary effort is normal.  Neurological:     Mental Status: He is alert.     Review of Systems  Cardiovascular: Negative for chest pain.  Gastrointestinal: Negative for abdominal pain.  Neurological: Positive for headaches.    Blood pressure 125/73, pulse 69,  temperature 99.2 F (37.3 C), temperature source Oral, resp. rate 16, height 5\' 9"  (1.753 m), weight 61.2 kg, SpO2 100 %.Body mass index is 19.94 kg/m.  General Appearance: Casual less drowsy appearing  Eye Contact:  Good  Speech:  more mumbling but mostly clear  Volume:  Decreased  Mood:  Euthymic  Affect:  Flat  Thought Process:  Linear  Orientation:  NA  Thought Content:  Logical  Suicidal Thoughts:  No  Homicidal Thoughts:  No  Memory:  Recent;   Fair  Judgement:  Other:  improving  Insight:  Shallow  Psychomotor Activity:  Psychomotor Retardation  Concentration:  Concentration: Fair  Recall:  NA  Fund of Knowledge:  Poor  Language:  Fair  Akathisia:  No  Handed:  Left  AIMS (if indicated):     Assets:  Leisure Time  ADL's:  Intact  Cognition:  Impaired,  Mild  Sleep:  Number of Hours: 6.75     Treatment Plan Summary: Daily contact with patient to assess and evaluate symptoms and progress in treatment  Mr. Klugh is a 19 yo with a history of schizophrenia. Patient appears to be doing better on new dose 15mg  Abilify QHS as he appears less sedated but also continues to not endorse AVH nor HI as he has off his medication in the past. Patient is also reporting some  improvements in his mood and does not reports increase thoughts of SI as may be concerned for with a 19 yr old on an SSRI. Patient will need to continue to be monitored as patient just received these medications last night and has not had them in his system a full 24hrs. Patient does continue to exhibit self-isolating behavior as well and patient is encouraged to go to group therapy and interact with the milieu more. Patient T4 was WNL and T3 is pending. Medical cause for depressed mood will also need to be evaluated when T3 results. At this time patient does not appear stable for discharge.  Grief - SW to work on Grief counseling for patient at f/u Schizophrenia Spectrum disorder w/ psychotic disorder not yet  determined -  Abilify  15mg  QHS - 25mg  Zoloft, for depressed mood - Ensure - EKG - UDS Low TSH - T3 free  PRN -Tylenol 650mg  q6h, pain -Maalox 18ml q4h, indigestion -Milk of Mag 28mL, constipation -Trazodone 50mg  QHS, insomnia -Agitation Protocol      Geodon 20mg       Ativan 1mg   PGY-1 , MD 02/29/2020, 11:30 AM

## 2020-02-29 NOTE — Progress Notes (Signed)
Recreation Therapy Notes  Date:  12.22.21 Time: 0930 Location: [address] Group Room  Group Topic: Stress Management  Goal Area(s) Addresses:  Patient will identify positive stress management techniques. Patient will identify benefits of using stress management post d/c.  Intervention: Stress Management  Activity:  Guided Imagery.  LRT read a script that focused on envisioning your peaceful place.  Patients were to imagine being in the setting that gives them the most peace and offers the most calm and relaxation.    Education:  Stress Management, Discharge Planning.   Education Outcome: Acknowledges Education  Clinical Observations/Feedback: Pt did not attend activity.    Caroll Rancher, LRT/CTRS         Caroll Rancher A 02/29/2020 11:45 AM

## 2020-02-29 NOTE — [facility] Group Notes (Signed)
Patient did not attend Recreational Therapy group because he was asleep.

## 2020-03-01 LAB — RAPID URINE DRUG SCREEN, HOSP PERFORMED
Amphetamines: NOT DETECTED
Barbiturates: NOT DETECTED
Benzodiazepines: NOT DETECTED
Cocaine: NOT DETECTED
Opiates: NOT DETECTED
Tetrahydrocannabinol: POSITIVE — AB

## 2020-03-01 LAB — T3, FREE: T3, Free: 2.4 pg/mL (ref 2.0–4.4)

## 2020-03-01 MED ORDER — IBUPROFEN 600 MG PO TABS
600.0000 mg | ORAL_TABLET | Freq: Four times a day (QID) | ORAL | Status: DC | PRN
  Administered 2020-03-01: 600 mg via ORAL
  Filled 2020-03-01: qty 1

## 2020-03-01 MED ORDER — ENSURE ENLIVE PO LIQD
237.0000 mL | Freq: Three times a day (TID) | ORAL | Status: DC
  Administered 2020-03-01 – 2020-03-02 (×3): 237 mL via ORAL
  Filled 2020-03-01 (×6): qty 237

## 2020-03-01 NOTE — Progress Notes (Signed)
[facility] MD Progress Note  03/01/2020 12:31 PM Bradrick GARNER DULLEA  MRN:  [id number] Subjective:  Patient lying in bed this AM.  Patient is willing to get up out of bed for interview. Patient reports that he has not been going to groups and has only been lying in bed. Patient has been compliant with his medications and reports that he has no trouble sleeping at night. Patient reports that he is not having AH nor VH and reports that he only hears what sounds like voices when he is not taking the medication. Patient contracts for safety and does not endorse HI. It was explained to patient that he needs to go to group. Patient agreed and reported that he will do his best to stay awake today. Patient reports that his mood today is "better" and after prompting about concerns that patient is not eating well he reports that his recent jaw injury is making it difficult to eat more solid foods.  Principal Problem: Schizophrenia spectrum disorder with psychotic disorder type not yet determined (HCC) Diagnosis: Principal Problem:   Schizophrenia spectrum disorder with psychotic disorder type not yet determined (HCC) Active Problems:   Grief  Total Time spent with patient: 15 minutes  Past Psychiatric History: See H&P  Past Medical History:  Past Medical History:  Diagnosis Date  . Anxiety   . Depression   . Paranoid schizophrenia [facility])     Past Surgical History:  Procedure Laterality Date  . ORIF MANDIBULAR FRACTURE N/A 02/12/2020   Procedure: OPEN REDUCTION INTERNAL FIXATION (ORIF) MANDIBULAR FRACTURE [facility];  Surgeon: Rejeana Brock, MD;  Location: [facility] OR;  Service: ENT;  Laterality: N/A;   Family History: History reviewed. No pertinent family history. Family Psychiatric  History: See H&P Social History:  Social History   Substance and Sexual Activity  Alcohol Use No     Social History   Substance and Sexual Activity  Drug Use No    Social History   Socioeconomic History  . Marital  status: Single    Spouse name: Not on file  . Number of children: Not on file  . Years of education: Not on file  . Highest education level: Not on file  Occupational History  . Not on file  Tobacco Use  . Smoking status: Never Smoker  . Smokeless tobacco: Never Used  Vaping Use  . Vaping Use: Never used  Substance and Sexual Activity  . Alcohol use: No  . Drug use: No  . Sexual activity: Not Currently    Birth control/protection: None  Other Topics Concern  . Not on file  Social History Narrative   ** Merged History Encounter **       Social Determinants of Health   Financial Resource Strain: Not on file  Food Insecurity: Not on file  Transportation Needs: Not on file  Physical Activity: Not on file  Stress: Not on file  Social Connections: Not on file   Additional Social History:    Pain Medications: see MAR Prescriptions: see MAR Over the Counter: see MAR History of alcohol / drug use?: No history of alcohol / drug abuse Longest period of sobriety (when/how long): denied drugs/alcohol use Negative Consequences of Use:  (denied substance abuse)                    Sleep: Fair  Appetite:  Fair  Current Medications: Current Facility-Administered Medications  Medication Dose Route Frequency Provider Last Rate Last Admin  . acetaminophen (TYLENOL) tablet  650 mg  650 mg Oral Q6H PRN Oneta Rack, NP   650 mg at 02/29/20 1749  . alum & mag hydroxide-simeth (MAALOX/MYLANTA) 200-200-20 MG/5ML suspension 30 mL  30 mL Oral Q4H PRN Oneta Rack, NP      . ARIPiprazole (ABILIFY) tablet 15 mg  15 mg Oral QHS Antonieta Pert, MD   15 mg at 02/29/20 2128  . feeding supplement (ENSURE ENLIVE / ENSURE PLUS) liquid 237 mL  237 mL Oral BID BM Antonieta Pert, MD   237 mL at 03/01/20 1031  . ziprasidone (GEODON) injection 20 mg  20 mg Intramuscular Q12H PRN Oneta Rack, NP       And  . LORazepam (ATIVAN) tablet 1 mg  1 mg Oral PRN Oneta Rack, NP       . magnesium hydroxide (MILK OF MAGNESIA) suspension 30 mL  30 mL Oral Daily PRN Oneta Rack, NP      . sertraline (ZOLOFT) tablet 25 mg  25 mg Oral Daily Oneta Rack, NP   25 mg at 03/01/20 1030  . traZODone (DESYREL) tablet 50 mg  50 mg Oral QHS PRN Oneta Rack, NP   50 mg at 02/29/20 2128    Lab Results:  Results for orders placed or performed during the hospital encounter of 02/27/20 (from the past 48 hour(s))  T3, free     Status: None   Collection Time: 02/29/20  6:40 AM  Result Value Ref Range   T3, Free 2.4 2.0 - 4.4 pg/mL    Comment: (NOTE) Performed At: [facility] [address] [place], [place] [id number] Jolene Schimke MD OE:[id number]   T4, free     Status: None   Collection Time: 02/29/20  6:40 AM  Result Value Ref Range   Free T4 0.75 0.61 - 1.12 ng/dL    Comment: (NOTE) Biotin ingestion may interfere with free T4 tests. If the results are inconsistent with the TSH level, previous test results, or the clinical presentation, then consider biotin interference. If needed, order repeat testing after stopping biotin. Performed at [facility], [address], [place], [place] [address]     Blood Alcohol level:  Lab Results  Component Value Date   [facility] <10 02/26/2020   ETH <10 02/10/2020    Metabolic Disorder Labs: Lab Results  Component Value Date   HGBA1C 5.0 02/28/2020   MPG 96.8 02/28/2020   No results found for: PROLACTIN Lab Results  Component Value Date   CHOL 138 02/28/2020   TRIG 46 02/28/2020   HDL 34 (L) 02/28/2020   CHOLHDL 4.1 02/28/2020   VLDL 9 02/28/2020   LDLCALC 95 02/28/2020   LDLCALC 97 10/06/2019    Physical Findings: AIMS: Facial and Oral Movements Muscles of Facial Expression: None, normal Lips and Perioral Area: None, normal Jaw: None, normal Tongue: None, normal,Extremity Movements Upper (arms, wrists, hands, fingers): None, normal Lower (legs, knees, ankles, toes): None, normal, Trunk  Movements Neck, shoulders, hips: None, normal, Overall Severity Severity of abnormal movements (highest score from questions above): None, normal Incapacitation due to abnormal movements: None, normal Patient's awareness of abnormal movements (rate only patient's report): No Awareness, Dental Status Current problems with teeth and/or dentures?: Yes Does patient usually wear dentures?: No  CIWA:  CIWA-Ar Total: 3 COWS:  COWS Total Score: 2  Musculoskeletal: Strength & Muscle Tone: within normal limits Gait & Station: normal Patient leans: N/A  Psychiatric Specialty Exam: Physical Exam Eyes:  Conjunctiva/sclera: Conjunctivae normal.  Pulmonary:     Effort: Pulmonary effort is normal.  Neurological:     Mental Status: He is alert.     Review of Systems  Cardiovascular: Negative for chest pain.  Gastrointestinal: Negative for abdominal pain.  Neurological: Negative for headaches.    Blood pressure 118/64, pulse 82, temperature 98.3 F (36.8 C), temperature source Oral, resp. rate 16, height 5\' 9"  (1.753 m), weight 61.2 kg, SpO2 100 %.Body mass index is 19.94 kg/m.  General Appearance: Disheveled  Eye Contact:  Fair  Speech:  Clear and Coherent  Volume:  Decreased  Mood:  "better"   Affect:  Depressed  Thought Process:  Linear  Orientation:  Full (Time, Place, and Person)  Thought Content:  Logical  Suicidal Thoughts:  No  Homicidal Thoughts:  No  Memory:  Recent;   Good  Judgement:  Other:  Improving  Insight:  Shallow  Psychomotor Activity:  Psychomotor Retardation  Concentration:  Concentration: Fair  Recall:  NA  Fund of Knowledge:  NA  Language:  Fair  Akathisia:  No  Handed:  Right  AIMS (if indicated):     Assets:  Resilience  ADL's:  Impaired  Cognition:  Impaired,  Mild  Sleep:  Number of Hours: 6.5     Treatment Plan Summary: Daily contact with patient to assess and evaluate symptoms and progress in treatment  Mr. Marsalis is a 19 yo with a  history of schizophrenia. Patient appears to be not endorsing AVH nor SI nor HI on this dose there is concern from over sedation, but patient does not appear as drowsy as he did on 20mg  and patient has not been interacting as much on the unit for true assessment. Patient will work today to attend groups and see if he can manage to stay awake with stimulation. Patient continues to appear very dysphoric and is not completing his ADL's without some prompting. Will continue to monitor.  Patient T3,free (2.4) and T4, free (0.75). EKG shows NSR and QTc of 404  Grief - SW to work on Grief counseling for patient at f/u Schizophrenia Spectrum disorder w/ psychotic disorder not yet determined -  Abilify 15mg  QHS - 25mg  Zoloft, for depressed mood - Ensure increased to TID as patient is limited in foods he can chew - UDS Low TSH Patient will need to follow-up with his PCP for repeat TSH for evaluation for subclinical hyperthyroidism in 4-6 weeks.   PRN -Tylenol 650mg  q6h, pain -Maalox 60ml q4h, indigestion -Milk of Mag 60mL, constipation -Trazodone 50mg  QHS, insomnia -Agitation Protocol Geodon 20mg  Ativan 1mg   PGY-1 , MD 03/01/2020, 12:31 PM

## 2020-03-01 NOTE — [facility] Group Notes (Signed)
Patient did not attend Orientation and Goal Setting group because he was asleep.  

## 2020-03-01 NOTE — Progress Notes (Signed)
Progress note    03/01/20 1030  Psych Admission Type (Psych Patients Only)  Admission Status Voluntary  Psychosocial Assessment  Patient Complaints Anxiety  Eye Contact Fair  Facial Expression Anxious;Pensive;Sullen;Sad  Affect Anxious;Sad;Sullen  Speech Logical/coherent;Soft  Interaction Avoidant;Cautious;Forwards little;Guarded;Minimal  Motor Activity Slow  Appearance/Hygiene In scrubs  Behavior Characteristics Cooperative;Appropriate to situation;Anxious;Guarded  Mood Depressed;Anxious;Sad;Sullen;Pleasant  Thought Process  Coherency WDL  Content WDL  Delusions None reported or observed  Perception WDL  Hallucination None reported or observed  Judgment Poor  Confusion None  Danger to Self  Current suicidal ideation? Denies  Danger to Others  Danger to Others None reported or observed

## 2020-03-01 NOTE — Progress Notes (Addendum)
Pt stated he was doing better due the medications, the voices are less pronounced . Pt requested Ibuprofen for his back pain, NP-Jason ordered 600 mg Ibuprofen .

## 2020-03-01 NOTE — Progress Notes (Signed)
   03/01/20 2000  Psych Admission Type (Psych Patients Only)  Admission Status Voluntary  Psychosocial Assessment  Patient Complaints Anxiety  Eye Contact Fair  Facial Expression Anxious;Pensive;Sullen;Sad  Affect Anxious;Sad;Sullen  Speech Logical/coherent;Soft  Interaction Avoidant;Cautious;Forwards little;Guarded;Minimal  Motor Activity Slow  Appearance/Hygiene In scrubs  Behavior Characteristics Anxious;Cooperative  Mood Depressed;Anxious  Thought Process  Coherency WDL  Content WDL  Delusions None reported or observed  Perception WDL  Hallucination None reported or observed  Judgment Poor  Confusion None  Danger to Self  Current suicidal ideation? Denies  Danger to Others  Danger to Others None reported or observed

## 2020-03-01 NOTE — Plan of Care (Signed)
°  Problem: Education: Goal: Ability to state activities that reduce stress will improve Outcome: Progressing   Problem: Coping: Goal: Ability to identify and develop effective coping behavior will improve Outcome: Progressing   Problem: Education: Goal: Utilization of techniques to improve thought processes will improve Outcome: Progressing Goal: Knowledge of the prescribed therapeutic regimen will improve Outcome: Progressing   

## 2020-03-01 NOTE — [facility] Group Notes (Signed)
Pt did not attend wrap up group this evening.  

## 2020-03-01 NOTE — Progress Notes (Signed)
D: Patient presents with sad and depressed affect. Patient denies SI/HI at this time. Patient also denies AH/VH at this time. Patient contracts for safety.  A: Provided positive reinforcement and encouragement.  R: Patient cooperative and receptive to efforts. Patient remains safe on the unit.  02/29/20 2128  Psych Admission Type (Psych Patients Only)  Admission Status Voluntary  Psychosocial Assessment  Patient Complaints None  Eye Contact Brief  Facial Expression Sad  Affect Depressed;Sad  Speech Logical/coherent  Interaction Minimal  Motor Activity Other (Comment) (WDL)  Appearance/Hygiene In scrubs  Behavior Characteristics Cooperative;Appropriate to situation  Mood Depressed;Sad  Thought Process  Coherency WDL  Content WDL  Delusions None reported or observed  Perception WDL  Hallucination None reported or observed  Judgment Poor  Confusion None  Danger to Self  Current suicidal ideation? Denies  Danger to Others  Danger to Others None reported or observed

## 2020-03-02 DIAGNOSIS — F200 Paranoid schizophrenia: Principal | ICD-10-CM

## 2020-03-02 MED ORDER — IBUPROFEN 600 MG PO TABS: 600.0000 mg | ORAL_TABLET | Freq: Four times a day (QID) | ORAL | 0 refills | Status: DC | PRN

## 2020-03-02 MED ORDER — SERTRALINE HCL 25 MG PO TABS: 25.0000 mg | ORAL_TABLET | Freq: Every day | ORAL | 0 refills | Status: DC

## 2020-03-02 MED ORDER — ARIPIPRAZOLE 15 MG PO TABS: 15.0000 mg | ORAL_TABLET | Freq: Every day | ORAL | 0 refills | Status: DC

## 2020-03-02 MED ORDER — TRAZODONE HCL 50 MG PO TABS: 50.0000 mg | ORAL_TABLET | Freq: Every evening | ORAL | 0 refills | Status: DC | PRN

## 2020-03-02 NOTE — Progress Notes (Signed)
  [facility] Adult Case Management Discharge Plan :  Will you be returning to the same living situation after discharge:  No. Will be staying with aunt. At discharge, do you have transportation home?: No. Safe Transportation to be used Do you have the ability to pay for your medications: Yes,  has insurance   Release of information consent forms completed and in the chart;  Patient's signature needed at discharge.  Patient to Follow up at:  Follow-up Information    [facility] Follow up on 03/12/2020.   Why: You have a hospital follow up appointment on 03/12/20 at 8:30 am for therapy and medication management services.  This will be a Sports administrator appointment.  Contact information: [address]  [address] [place] [address] [phone]               Next level of care provider has access to [facility] Link:no  Safety Planning and Suicide Prevention discussed: Yes,  with mother  Have you used any form of tobacco in the last 30 days? (Cigarettes, Smokeless Tobacco, Cigars, and/or Pipes): No  Has patient been referred to the Quitline?: N/A patient is not a smoker  Patient has been referred for addiction treatment: N/A  Otelia Santee, LCSW 03/02/2020, 10:52 AM

## 2020-03-02 NOTE — [facility] Suicide Risk Assessment (Signed)
[facility] Discharge Suicide Risk Assessment   Principal Problem: Paranoid schizophrenia (HCC) Discharge Diagnoses: Principal Problem:   Paranoid schizophrenia (HCC) Active Problems:   Grief   Total Time spent with patient: 45 minutes  Musculoskeletal: Strength & Muscle Tone: within normal limits Gait & Station: normal Patient leans: no lean  Psychiatric Specialty Exam: Review of Systems  Blood pressure 115/67, pulse 85, temperature 98.2 F (36.8 C), temperature source Oral, resp. rate 16, height 5\' 9"  (1.753 m), weight 61.2 kg, SpO2 100 %.Body mass index is 19.94 kg/m.  General Appearance: Casual  Eye Contact::  Good  Speech:  Clear and Coherent409  Volume:  Normal  Mood:  Euthymic  Affect:  Constricted  Thought Process:  Goal Directed  Orientation:  Full (Time, Place, and Person)  Thought Content:  Logical  Suicidal Thoughts:  No  Homicidal Thoughts:  No  Memory:  Immediate;   Fair Recent;   Fair  Judgement:  Fair  Insight:  Fair  Psychomotor Activity:  Normal  Concentration:  Fair  Recall:  [phone] of Knowledge:Fair  Language: Fair  Akathisia:  No      Assets:  Desire for Improvement Leisure Time  Sleep:  Number of Hours: 6.25  Cognition: WNL  ADL's:  Intact   Mental Status Per Nursing Assessment::   On Admission:  NA  Demographic Factors:  Male, Low socioeconomic status and Unemployed  Loss Factors: Decrease in vocational status and Financial problems/change in socioeconomic status  Historical Factors: Impulsivity  Risk Reduction Factors:   Sense of responsibility to family, Positive social support and Positive therapeutic relationship  Continued Clinical Symptoms:  Dysthymia Schizophrenia:   Depressive state Previous Psychiatric Diagnoses and Treatments  Cognitive Features That Contribute To Risk:  Closed-mindedness    Suicide Risk:  Minimal: No identifiable suicidal ideation.  Patients presenting with no risk factors but with morbid  ruminations; may be classified as minimal risk based on the severity of the depressive symptoms   Follow-up Information    [facility] Follow up on 03/12/2020.   Why: You have a hospital follow up appointment on 03/12/20 at 8:30 am for therapy and medication management services.  This will be a 05/10/20 appointment.  Contact information: [address]  [address] [place] [phone]               Plan Of Care/Follow-up recommendations:  Activity:  regular Diet:  regular Tests:  see chart Other:  Follow up with Outpatient services, discussed compliance and medications reviewed, safety plan discussed  [phone], MD 03/02/2020, 11:12 AM

## 2020-03-02 NOTE — Plan of Care (Signed)
Patient is visible in the milieu, calm and cooperative. Denying suicidal thoughts. Reports that he was admitted for suicidal thoughts "but now I am good". Patient complained of jaw pain and received Tylenol. Received Ativan 1 mg PO as he he complained for anxiety. Currently reports no anxiety and rates his pain at 4/10. Patient reports that he is ready to be discharged and will go to his aunt's house.
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
## 2020-03-19 ENCOUNTER — Telehealth (INDEPENDENT_AMBULATORY_CARE_PROVIDER_SITE_OTHER): Payer: Self-pay

## 2020-03-20 ENCOUNTER — Telehealth (INDEPENDENT_AMBULATORY_CARE_PROVIDER_SITE_OTHER): Payer: Self-pay

## 2020-03-20 ENCOUNTER — Telehealth: Payer: Self-pay | Admitting: General Practice

## 2020-03-20 NOTE — Telephone Encounter (Signed)
Copied from CRM [id number]. Topic: General - Other >> Mar 19, 2020 11:51 AM Elliot Gault wrote: Jethro Bolus name: Rinaldo Cloud  Relation to pt: Dr. Narda Bonds from ENT  Call back number: [phone]    Reason for call:   Specialist unable to reach patient and would like PCP office to relay message to patient as soon as possible. Specialist would like to schedule a surgery as soon possible to remove hard ware out of patient mouth. Patient has a hospital follow up with Terrance Mass.   On 03/22/2020.

## 2020-03-21 ENCOUNTER — Telehealth (INDEPENDENT_AMBULATORY_CARE_PROVIDER_SITE_OTHER): Payer: Self-pay | Admitting: Primary Care

## 2020-03-21 NOTE — Telephone Encounter (Signed)
Called placed to Jeremy Taylor left a messages to call office. To reschedule appointment and provide [employer] health resources and walk in availability - [address]. [facility] [phone] [name]

## 2020-03-22 ENCOUNTER — Inpatient Hospital Stay (INDEPENDENT_AMBULATORY_CARE_PROVIDER_SITE_OTHER): Payer: Medicaid Other | Admitting: Primary Care

## 2020-04-01 ENCOUNTER — Encounter (HOSPITAL_COMMUNITY): Payer: Self-pay

## 2020-04-01 ENCOUNTER — Emergency Department (HOSPITAL_COMMUNITY)
Admission: EM | Admit: 2020-04-01 | Discharge: 2020-04-01 | Disposition: A | Discharge status: Home / Self Care | Payer: Medicaid Other | Attending: Emergency Medicine | Admitting: Emergency Medicine

## 2020-04-01 DIAGNOSIS — R519 Headache, unspecified: Secondary | ICD-10-CM | POA: Insufficient documentation

## 2020-04-01 DIAGNOSIS — M545 Low back pain, unspecified: Secondary | ICD-10-CM | POA: Insufficient documentation

## 2020-04-01 DIAGNOSIS — Z20822 Contact with and (suspected) exposure to covid-19: Secondary | ICD-10-CM | POA: Insufficient documentation

## 2020-04-01 LAB — SARS CORONAVIRUS 2 BY RT PCR (HOSPITAL ORDER, PERFORMED IN ~~LOC~~ HOSPITAL LAB): SARS Coronavirus 2: NEGATIVE

## 2020-04-01 MED ORDER — NAPROXEN 375 MG PO TABS: 375.0000 mg | ORAL_TABLET | Freq: Two times a day (BID) | ORAL | 0 refills | Status: DC | PRN

## 2020-04-01 MED ORDER — LIDOCAINE 5 % EX PTCH: 1.0000 | MEDICATED_PATCH | Freq: Every day | CUTANEOUS | 0 refills | Status: DC | PRN

## 2020-04-01 MED ORDER — NAPROXEN 250 MG PO TABS
375.0000 mg | ORAL_TABLET | Freq: Once | ORAL | Status: AC
  Administered 2020-04-01: 375 mg via ORAL
  Filled 2020-04-01: qty 2

## 2020-04-01 MED ORDER — LIDOCAINE 5 % EX PTCH
1.0000 | MEDICATED_PATCH | CUTANEOUS | Status: DC
  Administered 2020-04-01: 1 via TRANSDERMAL
  Filled 2020-04-01: qty 1

## 2020-04-01 MED ORDER — ACETAMINOPHEN 500 MG PO TABS
1000.0000 mg | ORAL_TABLET | Freq: Once | ORAL | Status: AC
  Administered 2020-04-01: 1000 mg via ORAL
  Filled 2020-04-01: qty 2
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
## 2020-04-01 NOTE — ED Triage Notes (Signed)
Pt reports that he has been having headache and back pain for the past week, denies fevers, vaccinated. Pt also reports that his mouth is wired shut and he missed his follow up and would like to see his surgeon while he is here.

## 2020-04-01 NOTE — Discharge Instructions (Addendum)
You were seen in the emergency department today for headache and back pain.  Your COVID test was negative.  We are sending home with the following medicines to help with your symptoms:   - Naproxen is a nonsteroidal anti-inflammatory medication that will help with pain and swelling. Be sure to take this medication as prescribed with food, 1 pill every 12 hours,  It should be taken with food, as it can cause stomach upset, and more seriously, stomach bleeding. Do not take other nonsteroidal anti-inflammatory medications with this such as Advil, Motrin, Aleve, Mobic, Goodie Powder, or Motrin.    -Lidoderm patch: Apply 1 patch to area with significant pain in your lower back once per day.  Be sure to remove and discard patch within 12 hours of application.   You make take Tylenol per over the counter dosing with these medications.   We have prescribed you new medication(s) today. Discuss the medications prescribed today with your pharmacist as they can have adverse effects and interactions with your other medicines including over the counter and prescribed medications. Seek medical evaluation if you start to experience new or abnormal symptoms after taking one of these medicines, seek care immediately if you start to experience difficulty breathing, feeling of your throat closing, facial swelling, or rash as these could be indications of a more serious allergic reaction  Try applying heat to your lower back  Please follow-up with your primary care provider or [facility] within 1 week for reevaluation of your headache and back pain.  Please also follow-up with ear nose and throat within 3 days for further evaluation and management of your prior jaw fracture with wire placement.  Return to the ER for new or worsening symptoms including but not limited to worsening pain, fever, inability to walk, numbness, tingling, weakness, loss of control of your bowel or bladder function, change  in your vision, new pain, or any other concerns.

## 2020-04-01 NOTE — ED Notes (Signed)
Pt states that his back hurts only when he stands and and it hurts in the left lower flank. Pt states headache is chronic

## 2020-04-05 ENCOUNTER — Telehealth (INDEPENDENT_AMBULATORY_CARE_PROVIDER_SITE_OTHER): Payer: Self-pay

## 2020-04-07 ENCOUNTER — Other Ambulatory Visit: Payer: Self-pay

## 2020-04-07 ENCOUNTER — Emergency Department (HOSPITAL_COMMUNITY)
Admission: EM | Admit: 2020-04-07 | Discharge: 2020-04-08 | Disposition: A | Discharge status: Home / Self Care | Payer: Medicaid Other | Attending: Emergency Medicine | Admitting: Emergency Medicine

## 2020-04-07 ENCOUNTER — Encounter (HOSPITAL_COMMUNITY): Payer: Self-pay | Admitting: Emergency Medicine

## 2020-04-07 DIAGNOSIS — R45850 Homicidal ideations: Secondary | ICD-10-CM | POA: Insufficient documentation

## 2020-04-07 DIAGNOSIS — T43226A Underdosing of selective serotonin reuptake inhibitors, initial encounter: Secondary | ICD-10-CM | POA: Insufficient documentation

## 2020-04-07 DIAGNOSIS — Z20822 Contact with and (suspected) exposure to covid-19: Secondary | ICD-10-CM | POA: Diagnosis not present

## 2020-04-07 DIAGNOSIS — R443 Hallucinations, unspecified: Secondary | ICD-10-CM | POA: Diagnosis not present

## 2020-04-07 DIAGNOSIS — F32A Depression, unspecified: Secondary | ICD-10-CM | POA: Diagnosis present

## 2020-04-07 DIAGNOSIS — Z79899 Other long term (current) drug therapy: Secondary | ICD-10-CM | POA: Diagnosis not present

## 2020-04-07 DIAGNOSIS — Z91128 Patient's intentional underdosing of medication regimen for other reason: Secondary | ICD-10-CM | POA: Insufficient documentation

## 2020-04-07 DIAGNOSIS — F200 Paranoid schizophrenia: Secondary | ICD-10-CM | POA: Diagnosis present

## 2020-04-07 DIAGNOSIS — Z560 Unemployment, unspecified: Secondary | ICD-10-CM | POA: Diagnosis not present

## 2020-04-07 DIAGNOSIS — T43596A Underdosing of other antipsychotics and neuroleptics, initial encounter: Secondary | ICD-10-CM | POA: Insufficient documentation

## 2020-04-07 DIAGNOSIS — R45851 Suicidal ideations: Secondary | ICD-10-CM | POA: Insufficient documentation

## 2020-04-07 DIAGNOSIS — T43216A Underdosing of selective serotonin and norepinephrine reuptake inhibitors, initial encounter: Secondary | ICD-10-CM | POA: Diagnosis not present

## 2020-04-07 DIAGNOSIS — F1910 Other psychoactive substance abuse, uncomplicated: Secondary | ICD-10-CM

## 2020-04-07 LAB — TROPONIN I (HIGH SENSITIVITY): Troponin I (High Sensitivity): 4 ng/L (ref <18)

## 2020-04-07 LAB — RAPID URINE DRUG SCREEN, HOSP PERFORMED
Amphetamines: NOT DETECTED
Barbiturates: NOT DETECTED
Benzodiazepines: NOT DETECTED
Cocaine: POSITIVE — AB
Opiates: NOT DETECTED
Tetrahydrocannabinol: POSITIVE — AB

## 2020-04-07 LAB — CBC
HCT: 42.8 % (ref 39.0–52.0)
Hemoglobin: 13.7 g/dL (ref 13.0–17.0)
MCH: 28.6 pg (ref 26.0–34.0)
MCHC: 32 g/dL (ref 30.0–36.0)
MCV: 89.4 fL (ref 80.0–100.0)
Platelets: 200 10*3/uL (ref 150–400)
RBC: 4.79 MIL/uL (ref 4.22–5.81)
RDW: 14.2 % (ref 11.5–15.5)
WBC: 6.5 10*3/uL (ref 4.0–10.5)
nRBC: 0 % (ref 0.0–0.2)

## 2020-04-07 LAB — COMPREHENSIVE METABOLIC PANEL
ALT: 43 U/L (ref 0–44)
AST: 22 U/L (ref 15–41)
Albumin: 4.3 g/dL (ref 3.5–5.0)
Alkaline Phosphatase: 108 U/L (ref 38–126)
Anion gap: 10 (ref 5–15)
BUN: 7 mg/dL (ref 6–20)
CO2: 26 mmol/L (ref 22–32)
Calcium: 9.6 mg/dL (ref 8.9–10.3)
Chloride: 101 mmol/L (ref 98–111)
Creatinine, Ser: 0.91 mg/dL (ref 0.61–1.24)
GFR, Estimated: >60 mL/min (ref >60)
Glucose, Bld: 98 mg/dL (ref 70–99)
Potassium: 3.7 mmol/L (ref 3.5–5.1)
Sodium: 137 mmol/L (ref 135–145)
Total Bilirubin: 0.5 mg/dL (ref 0.3–1.2)
Total Protein: 7.1 g/dL (ref 6.5–8.1)

## 2020-04-07 LAB — RESP PANEL BY RT-PCR (FLU A&B, COVID) ARPGX2
Influenza A by PCR: NEGATIVE
Influenza B by PCR: NEGATIVE
SARS Coronavirus 2 by RT PCR: NEGATIVE

## 2020-04-07 LAB — ETHANOL: Alcohol, Ethyl (B): <10 mg/dL (ref <10)

## 2020-04-07 LAB — ACETAMINOPHEN LEVEL: Acetaminophen (Tylenol), Serum: 11 ug/mL (ref 10–30)

## 2020-04-07 LAB — SALICYLATE LEVEL: Salicylate Lvl: <7 mg/dL — ABNORMAL LOW (ref 7.0–30.0)

## 2020-04-07 MED ORDER — SERTRALINE HCL 25 MG PO TABS
25.0000 mg | ORAL_TABLET | Freq: Every day | ORAL | Status: DC
  Administered 2020-04-08: 25 mg via ORAL
  Filled 2020-04-07: qty 1

## 2020-04-07 MED ORDER — ACETAMINOPHEN 325 MG PO TABS
650.0000 mg | ORAL_TABLET | Freq: Four times a day (QID) | ORAL | Status: DC | PRN
  Administered 2020-04-07 (×2): 650 mg via ORAL
  Filled 2020-04-07 (×2): qty 2

## 2020-04-07 MED ORDER — LIDOCAINE 5 % EX PTCH
3.0000 | MEDICATED_PATCH | CUTANEOUS | Status: DC
  Administered 2020-04-07: 3 via TRANSDERMAL
  Filled 2020-04-07: qty 3

## 2020-04-07 MED ORDER — ALUM & MAG HYDROXIDE-SIMETH 200-200-20 MG/5ML PO SUSP: 30.0000 mL | Freq: Four times a day (QID) | ORAL | Status: DC | PRN

## 2020-04-07 MED ORDER — ARIPIPRAZOLE 10 MG PO TABS
10.0000 mg | ORAL_TABLET | Freq: Once | ORAL | Status: AC
  Administered 2020-04-07: 10 mg via ORAL
  Filled 2020-04-07: qty 1

## 2020-04-07 MED ORDER — ONDANSETRON HCL 4 MG PO TABS: 4.0000 mg | ORAL_TABLET | Freq: Three times a day (TID) | ORAL | Status: DC | PRN

## 2020-04-07 MED ORDER — TRAZODONE HCL 50 MG PO TABS: 50.0000 mg | ORAL_TABLET | Freq: Every evening | ORAL | Status: DC | PRN

## 2020-04-07 NOTE — ED Triage Notes (Signed)
Pt presents to ED BIB [facility]. Pt c/o CP that radiates towards neck and chronic back pain. Pt also c/o SI/HI AVH. Pt reports he ran out of his Abilify 52m ago and has not been seen to get refilled.

## 2020-04-07 NOTE — ED Notes (Signed)
Breakfast Ordered 
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
## 2020-04-07 NOTE — ED Notes (Signed)
TTS in progress 

## 2020-04-07 NOTE — BH Assessment (Signed)
Melbourne Abts, PA-C, recommends overnight observation for safety and stabilization with psych reevaluation in the AM.

## 2020-04-08 DIAGNOSIS — F200 Paranoid schizophrenia: Secondary | ICD-10-CM

## 2020-04-08 DIAGNOSIS — F1910 Other psychoactive substance abuse, uncomplicated: Secondary | ICD-10-CM

## 2020-04-08 MED ORDER — ARIPIPRAZOLE 10 MG PO TABS
10.0000 mg | ORAL_TABLET | Freq: Once | ORAL | Status: AC
  Administered 2020-04-08: 10 mg via ORAL
  Filled 2020-04-08: qty 1

## 2020-04-08 MED ORDER — TRAZODONE HCL 50 MG PO TABS
50.0000 mg | ORAL_TABLET | Freq: Once | ORAL | Status: AC
  Administered 2020-04-08: 50 mg via ORAL
  Filled 2020-04-08: qty 1

## 2020-04-08 MED ORDER — ARIPIPRAZOLE 10 MG PO TABS: 10.0000 mg | ORAL_TABLET | Freq: Once | ORAL | Status: DC

## 2020-04-08 NOTE — ED Provider Notes (Signed)
Pt has been cleared by psychiatry and references were provided by social work.  IVC rescinded.   Gwyneth Sprout, MD 04/08/20 1000

## 2020-04-08 NOTE — Discharge Instructions (Signed)
Outpatient Mental Health:  [facility]--PLEASE WALK IN ON Monday AT 8AM TO BE SEEN FOR FOLLOW-UP. PLEASE MAKE SURE TO BRING YOU DISCHARGE SUMMARY.  Phone: [phone] Address: [address], [place] [address]  Hours:  Sunday Closed Monday Open 24 hours Tuesday Open 24 hours Wednesday Open 24 hours Thursday Open 24 hours Friday Open 24 hours Saturday Closed  [facility] [address]  [place], [place] [address] Phone: [phone]  (provides 24/7 walk-in beheavioral health assessments, outpatient medication management and therapy during regular business hours).    [facility]: offers peer support, support groups, recovery skills classes, and compeer programs for free to the community.  Phone: [phone] Located in: [facility]  Address: [address] [place], [place] [address] Hours:  Sunday Closed Monday 9AM-4:30PM Tuesday 9AM-4:30PM Wednesday 9AM-4:30PM Thursday 9AM-4:30PM Friday 9AM-2:30PM Saturday Closed   [facility] [address] [place], [place] [address] PHONE: [phone] Hours: Monday-Friday 8:00AM-3:00PM  This agency offers the following services for free:   *Integrated Care -Case management -[facility] [address] [employer] -Medical clinic -Mental health nurse -Referrals  *[facility] [facility] -Showers and hygiene supplies -Laundry -Phone access -Mailing addresses and mailboxes -Replacement IDs -Onsite barbershop -Storage lockers -[employer] winter warming center  *Engineer, drilling -Skilled trade classes -Job skills classes -Resume and jobs application assistance -Interview training -GED classes -Environmental health practitioner -Financial literacy   Substance abuse resources and Residential Options:  [facility] day residential substance abuse facility (not an option if  you have active assault charges). [address], [place], [place] [address] Phone: [phone]: Ask for Drema Pry in admissions to complete intake if interested in pursuing this option.  [facility]: Can get intake scheduled; (not an option if you have active assault charges). [address] [place], [place] [phone]) Call Mon-Fri.  [facility]): (offers outpatient therapy and intensive outpatient substance abuse therapy).  [address] [place], [place] [address] Phone: [phone]  [facility]: Offers FREE recovery skills classes, support groups, 1:1 Peer Support, and Compeer Classes. [address], [place], [place] [address] Phone: [phone] (Call to complete intake).   [facility] [address] [place], [place] [address] Phone: [phone] ext [phone]  The [facility] provides food, shelter and other programs and services to the homeless men of [place] through our [employer] program.  By offering safe shelter, three meals a day, clean clothing, Biblical counseling, financial planning, vocational training, GED/education and employment assistance, we've helped mend the shattered lives of many homeless men since opening in 1974.  We have approximately 267 beds available, with a max of 312 beds including mats for emergency situations and currently house an average of 270 men a night.  Prospective Client Check-In Information Photo ID Required (State/ Out of State/ [facility]) - if photo ID is not available, clients are required to have a printout of a police/sheriff's criminal history report. Help out with chores around the [facility]. No sex offender of any type (pending, charged, registered and/or any other sex related offenses) will be permitted to check in. Must be willing to abide by all rules, regulations, and policies established by the [employer]. The following will be  provided - shelter, food, clothing, and biblical counseling. If you or someone you know is in need of assistance at our [facility] shelter in [place], [place], please call [phone] ext. [phone].

## 2020-04-08 NOTE — Progress Notes (Addendum)
Per Ophelia Shoulder NP, pt has been psychiatrically cleared. CSW provided the following substance abuse/mental health/homeless resources in pt's discharge instructions:  Outpatient Mental Health:  [facility]--PLEASE WALK IN ON Monday AT 8AM TO BE SEEN FOR FOLLOW-UP. PLEASE MAKE SURE TO BRING YOU DISCHARGE SUMMARY.  Phone: [phone] Address: [address], [place] [address]  Hours:  Sunday Closed Monday Open 24 hours Tuesday Open 24 hours Wednesday Open 24 hours Thursday Open 24 hours Friday Open 24 hours Saturday Closed  [facility] [address]  [place], [place] [address] Phone: [phone]  (provides 24/7 walk-in beheavioral health assessments, outpatient medication management and therapy during regular business hours).    [facility]: offers peer support, support groups, recovery skills classes, and compeer programs for free to the community.  Phone: [phone] Located in: [facility]  Address: [address] [place], [place] [address] Hours:  Sunday Closed Monday 9AM-4:30PM Tuesday 9AM-4:30PM Wednesday 9AM-4:30PM Thursday 9AM-4:30PM Friday 9AM-2:30PM Saturday Closed   [facility] [address] [place], [place] [address] PHONE: [phone] Hours: Monday-Friday 8:00AM-3:00PM  This agency offers the following services for free:   *Integrated Care -Case management -[facility] [address] [employer] -Medical clinic -Mental health nurse -Referrals  *Fundamental Services -Showers and hygiene supplies -Laundry -Phone access -Mailing addresses and mailboxes -Replacement IDs -Onsite barbershop -Storage lockers -[employer] winter warming center  *Engineer, drilling -Skilled trade classes -Job skills classes -Resume and jobs application assistance -Interview training -GED  classes -Environmental health practitioner -Financial literacy   Substance abuse resources and Residential Options:  [facility] day residential substance abuse facility (not an option if you have active assault charges). [address] [place], [place] [address] Phone: [phone]: Ask for Drema Pry in admissions to complete intake if interested in pursuing this option.  [facility]: Can get intake scheduled; (not an option if you have active assault charges). [address] [place], [place] [phone]) Call Mon-Fri.  [facility]): (offers outpatient therapy and intensive outpatient substance abuse therapy).  [address], [place], [place] [address] Phone: [phone]  [facility]: Offers FREE recovery skills classes, support groups, 1:1 Peer Support, and Compeer Classes. [address] [place], [place] [address] Phone: [phone] (Call to complete intake).   [facility] [address] [place], [place] [address] Phone: [phone] ext [phone]  [facility] provides food, shelter and other programs and services to the homeless men of [place] through our [employer] program.  By offering safe shelter, three meals a day, clean clothing, Biblical counseling, financial planning, vocational training, GED/education and employment assistance, we've helped mend the shattered lives of many homeless men since opening in 1974.  We have approximately 267 beds available, with a max of 312 beds including mats for emergency situations and currently house an average of 270 men a night.  Prospective Client Check-In Information Photo ID Required (State/ Out of State/ [facility]) - if photo ID is not available, clients are required to have a printout of a police/sheriff's criminal history report. Help out with chores around the [facility]. No sex offender of any type (pending, charged, registered and/or any  other sex related offenses) will be permitted to check in. Must be willing to abide by all rules, regulations, and policies established by the [employer]. The following will be provided - shelter, food, clothing, and biblical counseling. If  you or someone you know is in need of assistance at our [facility] shelter in [place], [place], please call [phone] ext. [phone].   Keller Bounds S. Alan Ripper, MSW, LCSW Clinical Social Worker 04/08/2020 10:31 AM

## 2020-04-08 NOTE — BHH Suicide Risk Assessment (Cosign Needed Addendum)
Suicide Risk Assessment  Discharge Assessment   [facility] Discharge Suicide Risk Assessment   Principal Problem: Paranoid schizophrenia [facility]) Discharge Diagnoses: Principal Problem:   Paranoid schizophrenia [facility]) Active Problems:   Substance abuse (HCC)  April 08, 2020: Jeremy Taylor, 20 y.o., male who was admitted to [facility] for evaluation of worsening paranoia and SI in the context of substance abuse and medication non-adherence. He has a diagnosis of paranoid schizophrenia. Patient seen via telepsych by this provider; chart reviewed and consulted with Dr. Lucianne Muss on 04/08/20.  On evaluation Jeremy Taylor is laying on the gurney resting with his eyes closed.  When awakened by staff he is easily aroused and agrees to speak with this Clinical research associate.  He reports "I feel a lot better today since taking my medications".    Regarding event leading to his current hospitalization, He states he stopped taking his medication because he ran out and could not get them refilled.  He also has chronic pain issues, states cocaine helps him cope with pan and MH issues.  He tells me he receives medication management at [facility] on [address], [place], last visit was 2 months ago.      Since admission his UDS was + for cocaine and THC.  He was placed on CIWA protocol and did not have any significant withdrawal concerns. Patient  was restarted on his home medications, Abilify 10mg  po daily; sertraline 25mg  po daily, and Trazodone 50mg  po qhs prn sleep and demonstrates symptomatic improvement. Patient is clear and coherent, no longer endorses suicidal or homicidal ideations; denies audible or visual hallucinations.  He is offered referral for SUD but declines, however agrees to take resources for outpatient care.   Per [facility] TTS Admission Assessment 04/07/2020: 04/07/2020 Jeremy Taylor 04/09/2020   Jeremy Taylor is a 20 year old male presenting voluntarily to [facility] due to SI, HI and  hallucinations. Patient reported SI and HI with no plan. Patient stated "the voices are taking over me, telling me to hurt people". Patient reported onset of SI, HI and hallucinations "on and off for the past months". Patient reported not being on medications. Patient reported using 4 grams of cocaine daily and marijuana daily. Patient reported worsening depressive symptoms. Patient reports poor sleep and poor appetite.  Patient was last inpatient for psych treatment 02/27/2020 with same presentation. Patient was being seen at [facility] for medication management. Patient reported running out of his Abilify 2 months ago and has not been seen to get a refill.   Patient currently resides with grandmother and cousin. Patient is currently unemployed. Patient denied access to guns. Patient was calm and cooperative during assessment. Patient unable to contract for safety. No collateral information given.   Total Time spent with patient: 30 minutes  Musculoskeletal: Patient seen moving around in bed/gurney but unable to fully assess per telepsychiatry.   Psychiatric Specialty Exam: @ROSBYAGE @  Blood pressure 115/68, pulse (!) 57, temperature 98.6 F (37 C), temperature source Oral, resp. rate 16, SpO2 97 %.There is no height or weight on file to calculate BMI.  General Appearance: Casual and Fairly Groomed  Eye Contact::  Good  Speech:  Clear and Coherent and Normal Rate409  Volume:  Normal  Mood:  Euthymic  Affect:  Restricted  Thought Process:  Coherent and Descriptions of Associations: Intact  Orientation:  Full (Time, Place, and Person)  Thought Content:  Logical, improved since admission and taking medications  Suicidal Thoughts:  No, improved  Homicidal Thoughts:  No  Memory:  Immediate;   Good Recent;   Good Remote;   Good  Judgement:  Fairin the context of continued drug use  Insight:  Fair  Psychomotor Activity:  Normal  Concentration:  Good  Recall:  Good   Fund of Knowledge:Good  Language: Good  Akathisia:  Negative  Handed:  Right  AIMS (if indicated):     Assets:  Architect Housing Social Support  Sleep:     Cognition: WNL  ADL's:  Intact   Mental Status Per Nursing Assessment::   On Admission:     Demographic Factors:  Male and Low socioeconomic status  Loss Factors: NA  Historical Factors: NA  Risk Reduction Factors:   Sense of responsibility to family, Living with another person, especially a relative and Positive social support  Continued Clinical Symptoms:  Alcohol/Substance Abuse/Dependencies Schizophrenia:   Paranoid or undifferentiated type  Cognitive Features That Contribute To Risk:  None    Suicide Risk:  Minimal: No identifiable suicidal ideation.  Patients presenting with no risk factors but with morbid ruminations; may be classified as minimal risk based on the severity of the depressive symptoms   Plan Of Care/Follow-up recommendations:  Plan- As per above assessment, there are no current grounds for involuntary commitment at this time.?   Patient is not currently interested in inpatient services, but expresses agreement to continue outpatient treatment - we have reviewed importance of substance abuse abstinence, potential negative impact substance abuse can have on his relationships and level of functioning, and importance of medication compliance. ?  Spoke with Corrie Mckusick, SW who will coordinate outpatient appointment at [facility] for medication management and substance abuse treatment within next 24-48 hours.    Information communicated with Dr.Plunkett, EDP, and Karren Cobble, RN via [employer] who will assist patient in getting 2 days supply of medications until he can be seen for outpatient follow-up.   Disposition: No evidence of imminent risk to self or others at present.   Patient does not meet criteria for psychiatric inpatient  admission. Supportive therapy provided about ongoing stressors. Discussed crisis plan, support from social network, calling 911, coming to the Emergency Department, and calling Suicide Hotline.   This service was provided via telemedicine using a 2-way, interactive audio and video technology.  Patient location: [facility] ED Provider location: virtual home office  Names of all persons participating in this telemedicine service and their role in this encounter. Name: Matheu D Demers Role: Patient  Name: Ophelia Shoulder Role: PMHNP   Chales Abrahams, NP 04/08/2020, 8:58 AM

## 2020-04-08 NOTE — ED Notes (Signed)
SI/Vol-obs-reassess am Breakfast order placed

## 2020-04-11 ENCOUNTER — Telehealth (INDEPENDENT_AMBULATORY_CARE_PROVIDER_SITE_OTHER): Payer: Self-pay

## 2020-04-12 ENCOUNTER — Encounter (HOSPITAL_COMMUNITY): Payer: Self-pay

## 2020-04-12 ENCOUNTER — Emergency Department (HOSPITAL_COMMUNITY)
Admission: EM | Admit: 2020-04-12 | Discharge: 2020-04-12 | Disposition: A | Discharge status: Home / Self Care | Payer: Medicaid Other | Attending: Emergency Medicine | Admitting: Emergency Medicine

## 2020-04-12 DIAGNOSIS — Y92410 Unspecified street and highway as the place of occurrence of the external cause: Secondary | ICD-10-CM | POA: Diagnosis not present

## 2020-04-12 DIAGNOSIS — R52 Pain, unspecified: Secondary | ICD-10-CM | POA: Insufficient documentation

## 2020-04-12 MED ORDER — ACETAMINOPHEN 500 MG PO TABS
1000.0000 mg | ORAL_TABLET | Freq: Once | ORAL | Status: AC
  Administered 2020-04-12: 1000 mg via ORAL
  Filled 2020-04-12: qty 2

## 2020-04-12 MED ORDER — LIDOCAINE 5 % EX PTCH
1.0000 | MEDICATED_PATCH | Freq: Every day | CUTANEOUS | 0 refills | Status: DC | PRN
Start: 2020-04-12 — End: 2020-11-29

## 2020-04-12 MED ORDER — IBUPROFEN 200 MG PO TABS
400.0000 mg | ORAL_TABLET | Freq: Once | ORAL | Status: AC
  Administered 2020-04-12: 400 mg via ORAL
  Filled 2020-04-12: qty 2

## 2020-04-12 NOTE — ED Notes (Signed)
Pt is unable to keep his eyes open at triage and states his pain is 10/10 Pt denies any drugs or alcohol except wine yesterday
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
## 2020-05-31 ENCOUNTER — Other Ambulatory Visit: Payer: Self-pay | Admitting: Otolaryngology

## 2020-05-31 ENCOUNTER — Other Ambulatory Visit (HOSPITAL_COMMUNITY): Payer: Self-pay | Admitting: Otolaryngology

## 2020-05-31 DIAGNOSIS — S02609D Fracture of mandible, unspecified, subsequent encounter for fracture with routine healing: Secondary | ICD-10-CM

## 2020-06-04 ENCOUNTER — Ambulatory Visit (HOSPITAL_COMMUNITY): Payer: Medicaid Other

## 2020-06-04 ENCOUNTER — Encounter (HOSPITAL_COMMUNITY): Payer: Self-pay

## 2020-06-22 ENCOUNTER — Telehealth (INDEPENDENT_AMBULATORY_CARE_PROVIDER_SITE_OTHER): Payer: Self-pay

## 2020-06-22 ENCOUNTER — Encounter (INDEPENDENT_AMBULATORY_CARE_PROVIDER_SITE_OTHER): Payer: Self-pay

## 2020-06-22 NOTE — Telephone Encounter (Signed)
I reached back out to pt by phone and mail. I left message for pt to call us back to get him scheduled for surgery to remove mouth hardware and to contact us to let us know if he has had it done elsewhere. I also mailed him a letter at [employer].

## 2020-07-05 ENCOUNTER — Ambulatory Visit (HOSPITAL_COMMUNITY): Admission: RE | Admit: 2020-07-05 | Payer: Medicaid Other | Source: Ambulatory Visit

## 2020-07-09 ENCOUNTER — Other Ambulatory Visit: Payer: Self-pay

## 2020-07-09 ENCOUNTER — Ambulatory Visit (HOSPITAL_COMMUNITY)
Admission: RE | Admit: 2020-07-09 | Discharge: 2020-07-09 | Disposition: A | Discharge status: Home / Self Care | Payer: Medicaid Other | Source: Ambulatory Visit | Attending: Otolaryngology | Admitting: Otolaryngology

## 2020-07-09 DIAGNOSIS — S02609D Fracture of mandible, unspecified, subsequent encounter for fracture with routine healing: Secondary | ICD-10-CM | POA: Insufficient documentation

## 2020-07-19 ENCOUNTER — Other Ambulatory Visit: Payer: Self-pay | Admitting: Otolaryngology

## 2020-07-26 ENCOUNTER — Encounter (HOSPITAL_COMMUNITY): Payer: Self-pay | Admitting: Otolaryngology

## 2020-07-31 ENCOUNTER — Encounter (HOSPITAL_COMMUNITY): Payer: Self-pay | Admitting: Anesthesiology
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
## 2020-08-01 ENCOUNTER — Ambulatory Visit (HOSPITAL_COMMUNITY): Admission: RE | Admit: 2020-08-01 | Payer: Medicaid Other | Source: Home / Self Care | Admitting: Otolaryngology

## 2020-08-01 SURGERY — REMOVAL, HARDWARE, MANDIBLE
Anesthesia: General

## 2020-08-13 ENCOUNTER — Telehealth: Payer: Self-pay | Admitting: *Deleted

## 2020-08-13 NOTE — Telephone Encounter (Signed)
Miss Jeremy Taylor, medical facilitator of [facility] called regarding pt needing "metal plate" removed from mouth.  RNCM reviewed chart to find that surgery was scheduled with Dr Annalee Genta but was canceled.  RNCM suggested finding an ENT in the area to remove mandibular hardwire.

## 2020-11-28 ENCOUNTER — Emergency Department (HOSPITAL_COMMUNITY): Payer: Medicaid Other

## 2020-11-28 ENCOUNTER — Inpatient Hospital Stay (HOSPITAL_COMMUNITY)
Admission: EM | Admit: 2020-11-28 | Discharge: 2020-11-29 | DRG: 897 | Disposition: A | Discharge status: Home / Self Care | Payer: Medicaid Other | Attending: Internal Medicine | Admitting: Internal Medicine

## 2020-11-28 ENCOUNTER — Encounter (HOSPITAL_COMMUNITY): Payer: Self-pay

## 2020-11-28 ENCOUNTER — Other Ambulatory Visit: Payer: Self-pay

## 2020-11-28 DIAGNOSIS — F32A Depression, unspecified: Secondary | ICD-10-CM | POA: Diagnosis present

## 2020-11-28 DIAGNOSIS — N179 Acute kidney failure, unspecified: Secondary | ICD-10-CM | POA: Diagnosis present

## 2020-11-28 DIAGNOSIS — F419 Anxiety disorder, unspecified: Secondary | ICD-10-CM | POA: Diagnosis present

## 2020-11-28 DIAGNOSIS — R0689 Other abnormalities of breathing: Secondary | ICD-10-CM

## 2020-11-28 DIAGNOSIS — F19129 Other psychoactive substance abuse with intoxication, unspecified: Principal | ICD-10-CM | POA: Diagnosis present

## 2020-11-28 DIAGNOSIS — J9692 Respiratory failure, unspecified with hypercapnia: Secondary | ICD-10-CM | POA: Diagnosis present

## 2020-11-28 DIAGNOSIS — R4182 Altered mental status, unspecified: Principal | ICD-10-CM | POA: Diagnosis present

## 2020-11-28 DIAGNOSIS — Z20822 Contact with and (suspected) exposure to covid-19: Secondary | ICD-10-CM | POA: Diagnosis present

## 2020-11-28 DIAGNOSIS — R0603 Acute respiratory distress: Secondary | ICD-10-CM | POA: Diagnosis present

## 2020-11-28 DIAGNOSIS — Z79899 Other long term (current) drug therapy: Secondary | ICD-10-CM | POA: Diagnosis not present

## 2020-11-28 DIAGNOSIS — F200 Paranoid schizophrenia: Secondary | ICD-10-CM | POA: Diagnosis present

## 2020-11-28 LAB — CBC WITH DIFFERENTIAL/PLATELET
Abs Immature Granulocytes: 0.05 10*3/uL (ref 0.00–0.07)
Basophils Absolute: 0 10*3/uL (ref 0.0–0.1)
Basophils Relative: 0 %
Eosinophils Absolute: 0.1 10*3/uL (ref 0.0–0.5)
Eosinophils Relative: 1 %
HCT: 37.9 % — ABNORMAL LOW (ref 39.0–52.0)
Hemoglobin: 12 g/dL — ABNORMAL LOW (ref 13.0–17.0)
Immature Granulocytes: 1 %
Lymphocytes Relative: 24 %
Lymphs Abs: 1.8 10*3/uL (ref 0.7–4.0)
MCH: 28.2 pg (ref 26.0–34.0)
MCHC: 31.7 g/dL (ref 30.0–36.0)
MCV: 89 fL (ref 80.0–100.0)
Monocytes Absolute: 0.7 10*3/uL (ref 0.1–1.0)
Monocytes Relative: 9 %
Neutro Abs: 5.1 10*3/uL (ref 1.7–7.7)
Neutrophils Relative %: 65 %
Platelets: 147 10*3/uL — ABNORMAL LOW (ref 150–400)
RBC: 4.26 MIL/uL (ref 4.22–5.81)
RDW: 13.4 % (ref 11.5–15.5)
WBC: 7.7 10*3/uL (ref 4.0–10.5)
nRBC: 0 % (ref 0.0–0.2)

## 2020-11-28 LAB — I-STAT VENOUS BLOOD GAS, ED
Acid-Base Excess: 3 mmol/L — ABNORMAL HIGH (ref 0.0–2.0)
Bicarbonate: 31.2 mmol/L — ABNORMAL HIGH (ref 20.0–28.0)
Calcium, Ion: 1.16 mmol/L (ref 1.15–1.40)
HCT: 39 % (ref 39.0–52.0)
Hemoglobin: 13.3 g/dL (ref 13.0–17.0)
O2 Saturation: 99 %
Potassium: 4.8 mmol/L (ref 3.5–5.1)
Sodium: 141 mmol/L (ref 135–145)
TCO2: 33 mmol/L — ABNORMAL HIGH (ref 22–32)
pCO2, Ven: 65.3 mmHg — ABNORMAL HIGH (ref 44.0–60.0)
pH, Ven: 7.287 (ref 7.250–7.430)
pO2, Ven: 144 mmHg — ABNORMAL HIGH (ref 32.0–45.0)

## 2020-11-28 LAB — URINALYSIS, COMPLETE (UACMP) WITH MICROSCOPIC
Bacteria, UA: NONE SEEN
Bilirubin Urine: NEGATIVE
Glucose, UA: NEGATIVE mg/dL
Hgb urine dipstick: NEGATIVE
Ketones, ur: NEGATIVE mg/dL
Leukocytes,Ua: NEGATIVE
Nitrite: NEGATIVE
Protein, ur: 30 mg/dL — AB
Specific Gravity, Urine: >1.03 — ABNORMAL HIGH (ref 1.005–1.030)
pH: 6 (ref 5.0–8.0)

## 2020-11-28 LAB — COMPREHENSIVE METABOLIC PANEL
ALT: 61 U/L — ABNORMAL HIGH (ref 0–44)
AST: 61 U/L — ABNORMAL HIGH (ref 15–41)
Albumin: 4 g/dL (ref 3.5–5.0)
Alkaline Phosphatase: 105 U/L (ref 38–126)
Anion gap: 7 (ref 5–15)
BUN: 15 mg/dL (ref 6–20)
CO2: 29 mmol/L (ref 22–32)
Calcium: 8.8 mg/dL — ABNORMAL LOW (ref 8.9–10.3)
Chloride: 104 mmol/L (ref 98–111)
Creatinine, Ser: 1.3 mg/dL — ABNORMAL HIGH (ref 0.61–1.24)
GFR, Estimated: >60 mL/min (ref >60)
Glucose, Bld: 115 mg/dL — ABNORMAL HIGH (ref 70–99)
Potassium: 4.5 mmol/L (ref 3.5–5.1)
Sodium: 140 mmol/L (ref 135–145)
Total Bilirubin: 0.2 mg/dL — ABNORMAL LOW (ref 0.3–1.2)
Total Protein: 6.9 g/dL (ref 6.5–8.1)

## 2020-11-28 LAB — I-STAT ARTERIAL BLOOD GAS, ED
Acid-Base Excess: 1 mmol/L (ref 0.0–2.0)
Acid-base deficit: 1 mmol/L (ref 0.0–2.0)
Bicarbonate: 26.3 mmol/L (ref 20.0–28.0)
Bicarbonate: 28.9 mmol/L — ABNORMAL HIGH (ref 20.0–28.0)
Calcium, Ion: 1.24 mmol/L (ref 1.15–1.40)
Calcium, Ion: 1.25 mmol/L (ref 1.15–1.40)
HCT: 38 % — ABNORMAL LOW (ref 39.0–52.0)
HCT: 37 % — ABNORMAL LOW (ref 39.0–52.0)
Hemoglobin: 12.9 g/dL — ABNORMAL LOW (ref 13.0–17.0)
Hemoglobin: 12.6 g/dL — ABNORMAL LOW (ref 13.0–17.0)
O2 Saturation: 89 %
O2 Saturation: 93 %
Patient temperature: 98.3
Potassium: 4.6 mmol/L (ref 3.5–5.1)
Potassium: 4.6 mmol/L (ref 3.5–5.1)
Sodium: 140 mmol/L (ref 135–145)
Sodium: 140 mmol/L (ref 135–145)
TCO2: 28 mmol/L (ref 22–32)
TCO2: 31 mmol/L (ref 22–32)
pCO2 arterial: 56.1 mmHg — ABNORMAL HIGH (ref 32.0–48.0)
pCO2 arterial: 60.6 mmHg — ABNORMAL HIGH (ref 32.0–48.0)
pH, Arterial: 7.278 — ABNORMAL LOW (ref 7.350–7.450)
pH, Arterial: 7.286 — ABNORMAL LOW (ref 7.350–7.450)
pO2, Arterial: 64 mmHg — ABNORMAL LOW (ref 83.0–108.0)
pO2, Arterial: 77 mmHg — ABNORMAL LOW (ref 83.0–108.0)

## 2020-11-28 LAB — RAPID URINE DRUG SCREEN, HOSP PERFORMED
Amphetamines: NOT DETECTED
Barbiturates: NOT DETECTED
Benzodiazepines: NOT DETECTED
Cocaine: NOT DETECTED
Opiates: NOT DETECTED
Tetrahydrocannabinol: POSITIVE — AB

## 2020-11-28 LAB — ETHANOL: Alcohol, Ethyl (B): <10 mg/dL (ref <10)

## 2020-11-28 LAB — RESP PANEL BY RT-PCR (FLU A&B, COVID) ARPGX2
Influenza A by PCR: NEGATIVE
Influenza B by PCR: NEGATIVE
SARS Coronavirus 2 by RT PCR: NEGATIVE

## 2020-11-28 LAB — ACETAMINOPHEN LEVEL: Acetaminophen (Tylenol), Serum: <10 ug/mL — ABNORMAL LOW (ref 10–30)

## 2020-11-28 LAB — CK: Total CK: 1115 U/L — ABNORMAL HIGH (ref 49–397)

## 2020-11-28 LAB — SALICYLATE LEVEL: Salicylate Lvl: <7 mg/dL — ABNORMAL LOW (ref 7.0–30.0)

## 2020-11-28 MED ORDER — SODIUM CHLORIDE 0.9 % IV SOLN: Freq: Once | INTRAVENOUS | Status: DC

## 2020-11-28 MED ORDER — ENOXAPARIN SODIUM 40 MG/0.4ML IJ SOSY
40.0000 mg | PREFILLED_SYRINGE | INTRAMUSCULAR | Status: DC
  Administered 2020-11-28: 40 mg via SUBCUTANEOUS
  Filled 2020-11-28: qty 0.4

## 2020-11-28 MED ORDER — ACETAMINOPHEN 650 MG RE SUPP: 650.0000 mg | Freq: Four times a day (QID) | RECTAL | Status: DC | PRN

## 2020-11-28 MED ORDER — NALOXONE HCL 0.4 MG/ML IJ SOLN: 0.4000 mg | Freq: Once | INTRAMUSCULAR | Status: DC

## 2020-11-28 MED ORDER — SODIUM CHLORIDE 0.9 % IV BOLUS
1000.0000 mL | Freq: Once | INTRAVENOUS | Status: DC
  Administered 2020-11-28: 1000 mL via INTRAVENOUS

## 2020-11-28 MED ORDER — NALOXONE HCL 2 MG/2ML IJ SOSY
1.0000 mg | PREFILLED_SYRINGE | Freq: Once | INTRAMUSCULAR | Status: AC
  Administered 2020-11-28: 1 mg via INTRAVENOUS
  Filled 2020-11-28: qty 2

## 2020-11-28 MED ORDER — LORAZEPAM 2 MG/ML IJ SOLN
1.0000 mg | Freq: Once | INTRAMUSCULAR | Status: AC
  Administered 2020-11-28: 1 mg via INTRAVENOUS
  Filled 2020-11-28: qty 1

## 2020-11-28 MED ORDER — DEXTROSE-NACL 5-0.9 % IV SOLN: INTRAVENOUS | Status: DC

## 2020-11-28 MED ORDER — NALOXONE HCL 2 MG/2ML IJ SOSY
1.0000 mg | PREFILLED_SYRINGE | Freq: Once | INTRAMUSCULAR | Status: AC
Start: 2020-11-28 — End: 2020-11-28
  Administered 2020-11-28: 1 mg via INTRAVENOUS
  Filled 2020-11-28: qty 2

## 2020-11-28 MED ORDER — ACETAMINOPHEN 325 MG PO TABS: 650.0000 mg | ORAL_TABLET | Freq: Four times a day (QID) | ORAL | Status: DC | PRN

## 2020-11-28 NOTE — Hospital Course (Addendum)
#  Altered mental status Patient received 1 dose of Narcan from EMS prior to presentation.  He received a second dose of Narcan in the ED and began oxygenating well.  Patient noted to have hypercapnia with PCO2 of 56.1, hypoxia with PO2. CT head was negative for any acute intracranial abnormality. Chest x-ray was negative.Tylenol, salicylate, ethanol levels were within normal limits. Urine drug screen was positive for THC only.  Morning labs were relatively unchanged.  He slowly became more alert, was oriented x4. EEG showed no definitive epileptiform abnormalities. Around noon he pulled out his IVs and walked to the bathroom, saying he wanted to go home because he had work. He endorsed hearing voices this morning and stated that has not taken his risperidone and congentin in a few days, requesting refills on these medications.
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
## 2020-11-28 NOTE — ED Notes (Signed)
Patient transported to CT 

## 2020-11-28 NOTE — ED Triage Notes (Signed)
After pt received narcan pt vomitted

## 2020-11-28 NOTE — ED Triage Notes (Signed)
NPA to right nare

## 2020-11-28 NOTE — ED Notes (Signed)
Collected by RT ABG

## 2020-11-28 NOTE — ED Notes (Signed)
Blenda Nicely sister [phone]

## 2020-11-28 NOTE — ED Triage Notes (Signed)
Mom picked up to run errands and was acting weird pt has hx OD then suddenly went unresponsive fire arrived RR 4 o2 86% NRB @10  EMS arrived gave 1 narcan IVP then RR 16 02 100 NRB pt initially required bagging. Currently still is unresponsive but breathing normally
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
## 2020-11-28 NOTE — ED Notes (Signed)
Patient transported to MRI 

## 2020-11-28 NOTE — ED Notes (Signed)
RT notified and is coming for ABG collection

## 2020-11-28 NOTE — ED Notes (Signed)
MD at bedside. 

## 2020-11-28 NOTE — ED Provider Notes (Signed)
Patient seen after prior ED provider.  Patient without improvement or worsening of his mentation or respiratory status.  Patient would benefit from admission for further observation and work-up.  Teaching service is aware of case and will evaluate for admission.   Wynetta Fines, MD 11/28/20 1655

## 2020-11-29 ENCOUNTER — Inpatient Hospital Stay (HOSPITAL_COMMUNITY): Payer: Medicaid Other

## 2020-11-29 ENCOUNTER — Other Ambulatory Visit (HOSPITAL_COMMUNITY): Payer: Self-pay

## 2020-11-29 DIAGNOSIS — R0689 Other abnormalities of breathing: Secondary | ICD-10-CM

## 2020-11-29 DIAGNOSIS — R4182 Altered mental status, unspecified: Secondary | ICD-10-CM

## 2020-11-29 LAB — I-STAT ARTERIAL BLOOD GAS, ED
Acid-Base Excess: 0 mmol/L (ref 0.0–2.0)
Bicarbonate: 28.1 mmol/L — ABNORMAL HIGH (ref 20.0–28.0)
Calcium, Ion: 1.28 mmol/L (ref 1.15–1.40)
HCT: 34 % — ABNORMAL LOW (ref 39.0–52.0)
Hemoglobin: 11.6 g/dL — ABNORMAL LOW (ref 13.0–17.0)
O2 Saturation: 98 %
Patient temperature: 98.3
Potassium: 4.1 mmol/L (ref 3.5–5.1)
Sodium: 141 mmol/L (ref 135–145)
TCO2: 30 mmol/L (ref 22–32)
pCO2 arterial: 58.6 mmHg — ABNORMAL HIGH (ref 32.0–48.0)
pH, Arterial: 7.288 — ABNORMAL LOW (ref 7.350–7.450)
pO2, Arterial: 111 mmHg — ABNORMAL HIGH (ref 83.0–108.0)

## 2020-11-29 LAB — CBC
HCT: 36.4 % — ABNORMAL LOW (ref 39.0–52.0)
Hemoglobin: 11.5 g/dL — ABNORMAL LOW (ref 13.0–17.0)
MCH: 28.3 pg (ref 26.0–34.0)
MCHC: 31.6 g/dL (ref 30.0–36.0)
MCV: 89.4 fL (ref 80.0–100.0)
Platelets: 124 10*3/uL — ABNORMAL LOW (ref 150–400)
RBC: 4.07 MIL/uL — ABNORMAL LOW (ref 4.22–5.81)
RDW: 13.3 % (ref 11.5–15.5)
WBC: 5.8 10*3/uL (ref 4.0–10.5)
nRBC: 0 % (ref 0.0–0.2)

## 2020-11-29 LAB — COMPREHENSIVE METABOLIC PANEL
ALT: 46 U/L — ABNORMAL HIGH (ref 0–44)
AST: 43 U/L — ABNORMAL HIGH (ref 15–41)
Albumin: 3.4 g/dL — ABNORMAL LOW (ref 3.5–5.0)
Alkaline Phosphatase: 88 U/L (ref 38–126)
Anion gap: 5 (ref 5–15)
BUN: 10 mg/dL (ref 6–20)
CO2: 28 mmol/L (ref 22–32)
Calcium: 8.7 mg/dL — ABNORMAL LOW (ref 8.9–10.3)
Chloride: 106 mmol/L (ref 98–111)
Creatinine, Ser: 0.89 mg/dL (ref 0.61–1.24)
GFR, Estimated: >60 mL/min (ref >60)
Glucose, Bld: 111 mg/dL — ABNORMAL HIGH (ref 70–99)
Potassium: 4.1 mmol/L (ref 3.5–5.1)
Sodium: 139 mmol/L (ref 135–145)
Total Bilirubin: 0.7 mg/dL (ref 0.3–1.2)
Total Protein: 5.7 g/dL — ABNORMAL LOW (ref 6.5–8.1)

## 2020-11-29 MED ORDER — BENZTROPINE MESYLATE 1 MG PO TABS
1.0000 mg | ORAL_TABLET | Freq: Every day | ORAL | 2 refills | Status: DC
  Filled 2020-11-29: qty 30, 30d supply, fill #0

## 2020-11-29 MED ORDER — RISPERIDONE 2 MG PO TABS
2.0000 mg | ORAL_TABLET | Freq: Two times a day (BID) | ORAL | 2 refills | Status: DC
  Filled 2020-11-29: qty 60, 30d supply, fill #0

## 2020-11-29 NOTE — ED Notes (Signed)
Patient discharged to home. Mother called to pick patient up from the waiting room.

## 2020-11-29 NOTE — ED Notes (Signed)
Pulled his IV out and walked to bathroom. Stated he was ready to go home because he had to go to work.

## 2020-11-29 NOTE — Progress Notes (Signed)
EEG complete - results pending 

## 2020-11-29 NOTE — ED Notes (Signed)
Alert, drowsy. Oriented x 4. States the last thing he remembers is eating chicken and talking to his brother. Denies drug or alcohol use in the last 3 days. No complaints of pain. Bed noted to be saturated with urine. Clean gown and linen applied.

## 2020-11-29 NOTE — Progress Notes (Signed)
     Subjective: Once we were able to see the patient, he was awake, alert and able to explain to [place] his medications.  He notes recently being in [place] and being on Risperidone 2mg  BID and Cogentin 1mg  daily.  He notes that he has been out of these medications for 3 days.  He feels they were helping him and he is having re-occurrence of voices in his head.  He feels back to his baseline and would like to return  home today.  Requesting refills of his medications.   Objective:  Vital signs in last 24 hours: Vitals:   11/29/20 0804 11/29/20 0845 11/29/20 1145 11/29/20 1214  BP:  (!) 137/94 138/85 (!) 142/66  Pulse: 65 93 96 90  Resp: 18 15  17   Temp:    97.8 F (36.6 C)  TempSrc:      SpO2: 100% 95% 99% 99%  Weight:      Height:       Gen: Lying in bed, alert, speaking in full sentences Eyes; Anicteric sclerae HENT: Neck is supple, MMM CV: RR, NR, no murmur, no pedal edema Pulm: CTAB, no wheezing MSK: Normal tone and bulk Skin: He has a pressure wound/bruise on the left medial ankle, no skin breakdown, no bleeding Psych: Normal mood and affect.   BUN 10 Cr 0.89 H/H 11.5 and 36.4  UDS + for THC only CK 1100  CT head without acute abnormality  EEG without signs of seizure.   Assessment/Plan:  Altered mental status, respiratory distress - Improved to baseline - DDx includes OD of substance not screened for in UDS vs. Catatonia associated with missing schizophrenia medications, other.  No seizure activity found on EEG - Patient is back to baseline and requesting to go home - D/C with home medications of risperidone and cogentin  Schizophrenia, out of medication - Will given refill for risperidone and cogentin - Will give follow up in [facility] for him to establish care.   Dispo: Anticipated discharge today  12/01/20, MD 11/29/2020, 3:31 PM

## 2020-11-29 NOTE — Discharge Summary (Signed)
Name: Jeremy Taylor MRN: [id number] DOB: [birth year] 20 y.o. PCP: Patient, No Pcp Per (Inactive)  Date of Admission: 11/28/2020  1:00 PM Date of Discharge:  11/29/2020 Attending Physician: Dr. Criselda Peaches  DISCHARGE DIAGNOSIS:  Primary Problem: Altered mental status   Hospital Problems: Principal Problem:   Altered mental status Active Problems:   Hypercarbia    DISCHARGE MEDICATIONS:   Allergies as of 11/29/2020   No Known Allergies      Medication List     STOP taking these medications    lidocaine 5 % Commonly known as: Lidoderm   naproxen 375 MG tablet Commonly known as: NAPROSYN       TAKE these medications    benztropine 1 MG tablet Commonly known as: COGENTIN Take 1 tablet (1 mg total) by mouth daily.   risperiDONE 2 MG tablet Commonly known as: RisperDAL Take 1 tablet (2 mg total) by mouth 2 (two) times daily.        DISPOSITION AND FOLLOW-UP:  [name] was discharged from [facility] in Stable condition. At the hospital follow up visit please address:  Altered mental status No evidence of seizures on EEG.  Urine drug screen positive only for THC.  At hospital follow-up, ensure patient has adequate supply of medications for his schizophrenia.  Follow-up Recommendations: Consults: None Labs:  None Studies: None Medications: Restart Cogentin 1 mg daily, risperidone 2 mg twice daily.  Follow-up Appointments:  Follow-up Information     [facility]. Schedule an appointment as soon as possible for a visit in 1 week(s).   Specialty: Behavioral Health Contact information: [address] [place] [address] [place] [address] [phone]                HOSPITAL COURSE:  Patient Summary: #Altered mental status Patient received 1 dose of Narcan from EMS prior to presentation.  He received a second dose of Narcan in the ED and began oxygenating well.  Patient  noted to have hypercapnia with PCO2 of 56.1, hypoxia with PO2. CT head was negative for any acute intracranial abnormality. Chest x-ray was negative.Tylenol, salicylate, ethanol levels were within normal limits. Urine drug screen was positive for THC only.  Morning labs were relatively unchanged.  He slowly became more alert, was oriented x4. EEG showed no definitive epileptiform abnormalities. Around noon he pulled out his IVs and walked to the bathroom, saying he wanted to go home because he had work. He endorsed hearing voices this morning and stated that has not taken his risperidone and congentin in a few days, requesting refills on these medications.   DISCHARGE INSTRUCTIONS:    SUBJECTIVE:  Patient was evaluated at bedside on day of discharge.  He was alert, oriented x4 to time.  He endorsed having auditory hallucinations and noted that he had been without his medications for a few days now.  He would like to go home.  Discharge Vitals:   BP (!) 142/66 (BP Location: Right Arm)   Pulse 90   Temp 97.8 F (36.6 C)   Resp 17   Ht 5\' 9"  (1.753 m)   Wt 65.8 kg   SpO2 99%   BMI 21.42 kg/m   OBJECTIVE:  Physical Exam Constitutional:      Comments: Alert as we talked but extremely tired and wanted to take a nap as soon as we finished speaking. Shivering during the interview. HENT:     Head: Normocephalic and atraumatic.  Mouth/Throat:     Comments: Lips and mouth were dry Cardiovascular:     Rate and Rhythm: Normal rate and regular rhythm.  Pulmonary:     Effort: Pulmonary effort is normal.     Breath sounds: Normal breath sounds. No wheezing.  Abdominal:     General: There is no distension.  Musculoskeletal:        General: Normal range of motion. No evident weakness. Neurological:     Mental Status: He is alert and oriented to person, place, and time.  Psychiatric:        Thought Content: Mood was distressed. Thought content normal.    Pertinent Labs, Studies, and  Procedures:  CBC Latest Ref Rng & Units 11/29/2020 11/29/2020 11/28/2020  WBC 4.0 - 10.5 K/uL 5.8 - -  Hemoglobin 13.0 - 17.0 g/dL 11.5(L) 11.6(L) 12.6(L)  Hematocrit 39.0 - 52.0 % 36.4(L) 34.0(L) 37.0(L)  Platelets 150 - 400 K/uL 124(L) - -    CMP Latest Ref Rng & Units 11/29/2020 11/29/2020 11/28/2020  Glucose 70 - 99 mg/dL 601(U) - -  BUN 6 - 20 mg/dL 10 - -  Creatinine 9.32 - 1.24 mg/dL 3.55 - -  Sodium 732 - 145 mmol/L 139 141 140  Potassium 3.5 - 5.1 mmol/L 4.1 4.1 4.6  Chloride 98 - 111 mmol/L 106 - -  CO2 22 - 32 mmol/L 28 - -  Calcium 8.9 - 10.3 mg/dL 2.0(U) - -  Total Protein 6.5 - 8.1 g/dL 5.4(Y) - -  Total Bilirubin 0.3 - 1.2 mg/dL 0.7 - -  Alkaline Phos 38 - 126 U/L 88 - -  AST 15 - 41 U/L 43(H) - -  ALT 0 - 44 U/L 46(H) - -    CT HEAD WO CONTRAST ( )  Result Date: 11/28/2020 CLINICAL DATA:  Altered mental status. EXAM: CT HEAD WITHOUT CONTRAST TECHNIQUE: Contiguous axial images were obtained from the base of the skull through the vertex without intravenous contrast. COMPARISON:  February 25, 2020. FINDINGS: Brain: No evidence of acute infarction, hemorrhage, hydrocephalus, extra-axial collection or mass lesion/mass effect. Vascular: No hyperdense vessel or unexpected calcification. Skull: Normal. Negative for fracture or focal lesion. Sinuses/Orbits: No acute finding. Other: None. IMPRESSION: No acute intracranial abnormality seen. Electronically Signed   By: Lupita Raider M.D.   On: 11/28/2020 16:09   DG Chest Port 1 View  Result Date: 11/28/2020 CLINICAL DATA:  Altered mental status EXAM: PORTABLE CHEST 1 VIEW COMPARISON:  08/07/2018 FINDINGS: The heart size and mediastinal contours are within normal limits. Both lungs are clear. The visualized skeletal structures are unremarkable. IMPRESSION: No active disease. Electronically Signed   By: Duanne Guess D.O.   On: 11/28/2020 15:29   EEG adult  Result Date: 11/29/2020 Jefferson Fuel, MD     11/29/2020  9:38 AM  Routine EEG Report Jeremy Taylor is a 20 y.o. male with a history of altered mental status who is undergoing an EEG to evaluate for seizures. Report: This EEG was acquired with electrodes placed according to the International 10-20 electrode system (including Fp1, Fp2, F3, F4, C3, C4, P3, P4, O1, O2, T3, T4, T5, T6, A1, A2, Fz, Cz, Pz). The following electrodes were missing or displaced: none. The occipital dominant rhythm was 7 Hz with superimposed diffuse intermittent rhythmic delta slowing. This activity is reactive to stimulation. Drowsiness was manifested by background fragmentation; deeper stages of sleep were not identified. There was no focal slowing. There were no interictal epileptiform discharges. There were no  electrographic seizures identified. Photic stimulation and hyperventilation were not performed. Impression and clinical correlation: This EEG was obtained while awake and drowsy and is abnormal due to mild diffuse slowing and superimposed diffuse intermittent rhythmic delta slowing. Both of these findings are indicative of global cerebral dysfunction. There were no definitive epileptiform abnormalities seen in this recording. Bing Neighbors, MD [facility] [phone] If 7pm- 7am, please page neurology on call as listed in AMION.     Signed: Champ Mungo, D.O.  Internal Medicine Resident, PGY-1 [facility]  Pager: [phone] 5:32 PM, 11/29/2020

## 2020-11-29 NOTE — ED Notes (Signed)
Bedside EEG in progress.

## 2020-11-29 NOTE — Care Management (Signed)
ED RNCM received call from [facility] that they received prescriptions for patient, but patient has already been discharged home with Mom.  RNCM attempted to contact sister  Blenda Nicely  [phone].  She states, they will come to the [facility] to pick up meds. Patient is uninsured and cannot afford medications, RNCM will MATCH patient .

## 2020-11-29 NOTE — ED Notes (Signed)
Providers at bedside discussing plan with patient.

## 2020-11-29 NOTE — Procedures (Signed)
Routine EEG Report  Jeremy Taylor is a 20 y.o. male with a history of altered mental status who is undergoing an EEG to evaluate for seizures.  Report: This EEG was acquired with electrodes placed according to the International 10-20 electrode system (including Fp1, Fp2, F3, F4, C3, C4, P3, P4, O1, O2, T3, T4, T5, T6, A1, A2, Fz, Cz, Pz). The following electrodes were missing or displaced: none.  The occipital dominant rhythm was 7 Hz with superimposed diffuse intermittent rhythmic delta slowing. This activity is reactive to stimulation. Drowsiness was manifested by background fragmentation; deeper stages of sleep were not identified. There was no focal slowing. There were no interictal epileptiform discharges. There were no electrographic seizures identified. Photic stimulation and hyperventilation were not performed.   Impression and clinical correlation: This EEG was obtained while awake and drowsy and is abnormal due to mild diffuse slowing and superimposed diffuse intermittent rhythmic delta slowing. Both of these findings are indicative of global cerebral dysfunction.  There were no definitive epileptiform abnormalities seen in this recording.  Bing Neighbors, MD [facility] [phone]  If 7pm- 7am, please page neurology on call as listed in AMION.

## 2020-11-30 ENCOUNTER — Other Ambulatory Visit (HOSPITAL_COMMUNITY): Payer: Self-pay

## 2021-07-12 ENCOUNTER — Encounter (HOSPITAL_COMMUNITY): Payer: Self-pay

## 2021-07-12 ENCOUNTER — Emergency Department (HOSPITAL_COMMUNITY)
Admission: EM | Admit: 2021-07-12 | Discharge: 2021-07-13 | Disposition: A | Discharge status: Home / Self Care | Payer: Medicaid Other | Attending: Emergency Medicine | Admitting: Emergency Medicine

## 2021-07-12 ENCOUNTER — Other Ambulatory Visit: Payer: Self-pay

## 2021-07-12 DIAGNOSIS — R440 Auditory hallucinations: Secondary | ICD-10-CM | POA: Insufficient documentation

## 2021-07-12 DIAGNOSIS — R443 Hallucinations, unspecified: Secondary | ICD-10-CM

## 2021-07-12 DIAGNOSIS — R441 Visual hallucinations: Secondary | ICD-10-CM | POA: Insufficient documentation

## 2021-07-12 DIAGNOSIS — Z760 Encounter for issue of repeat prescription: Secondary | ICD-10-CM | POA: Insufficient documentation

## 2021-07-12 DIAGNOSIS — Z79899 Other long term (current) drug therapy: Secondary | ICD-10-CM

## 2021-07-12 NOTE — ED Triage Notes (Signed)
Pt has not taking psych meds for schizophrenia for 11  months. Pt having visual and auditory hallucinations. Denies SI/HI. PT UNSURE OF WHAT MEDICATION IS. ?

## 2021-07-13 MED ORDER — RISPERIDONE 2 MG PO TABS: 2.0000 mg | ORAL_TABLET | Freq: Every day | ORAL | 0 refills | Status: DC

## 2021-07-13 NOTE — ED Provider Notes (Signed)
?[facility] EMERGENCY DEPARTMENT ?Provider Note ? ? ?CSN: [id number] ?Arrival date & time: 07/12/21  2011 ? ?  ? ?History ? ?Chief Complaint  ?Patient presents with  ? Medication Refill  ? ? ?Jeremy Taylor is a 21 y.o. male. ? ?The history is provided by the patient and medical records.  ?Medication Refill ?Jeremy Taylor is a 21 y.o. male who presents to the Emergency Department complaining of medication refill.  Presents the emergency department requesting refill on his psychiatric medications.  He states he has been off them for about a year.  He has a history of schizophrenia.  He states that his hallucinations are starting to bother him and he would like to restart his medications.  He has auditory and visual hallucinations.  Mostly they are people and they are talking to him but he tries to not listen to what they are saying.  He denies any SI, HI.  He does not currently have a mental health counselor or psychiatrist.  No reports of pain, recent illnesses.  He does use tobacco.  Occasional alcohol use.  No drug use.  No additional medical problems. ?  ? ?Home Medications ?Prior to Admission medications   ?Medication Sig Start Date End Date Taking? Authorizing Provider  ?risperiDONE (RISPERDAL) 2 MG tablet Take 1 tablet (2 mg total) by mouth at bedtime. 07/13/21  Yes Tilden Fossa, MD  ?benztropine (COGENTIN) 1 MG tablet Take 1 tablet (1 mg total) by mouth daily. 11/29/20 02/27/21  Champ Mungo, DO  ?risperiDONE (RISPERDAL) 2 MG tablet Take 1 tablet (2 mg total) by mouth 2 (two) times daily. 11/29/20 02/27/21  Champ Mungo, DO  ?ARIPiprazole (ABILIFY) 15 MG tablet Take 1 tablet (15 mg total) by mouth at bedtime. For mood control ?Patient not taking: Reported on 04/01/2020 03/02/20 04/01/20  Armandina Stammer I, NP  ?sertraline (ZOLOFT) 25 MG tablet Take 1 tablet (25 mg total) by mouth daily. For depression ?Patient not taking: Reported on 04/01/2020 03/03/20 04/01/20  Armandina Stammer I, NP  ?traZODone  (DESYREL) 50 MG tablet Take 1 tablet (50 mg total) by mouth at bedtime as needed for sleep. ?Patient not taking: Reported on 04/01/2020 03/02/20 04/01/20  Sanjuana Kava, NP  ?   ? ?Allergies    ?Patient has no known allergies.   ? ?Review of Systems   ?Review of Systems  ?All other systems reviewed and are negative. ? ?Physical Exam ?Updated Vital Signs ?BP 127/86 (BP Location: Left Arm)   Pulse (!) 59   Temp 97.7 ?F (36.5 ?C) (Oral)   Resp 17   Ht 5\' 9"  (1.753 m)   Wt 68.9 kg   SpO2 100%   BMI 22.45 kg/m?  ?Physical Exam ?Vitals and nursing note reviewed.  ?Constitutional:   ?   Appearance: He is well-developed.  ?HENT:  ?   Head: Normocephalic and atraumatic.  ?Cardiovascular:  ?   Rate and Rhythm: Normal rate and regular rhythm.  ?Pulmonary:  ?   Effort: Pulmonary effort is normal. No respiratory distress.  ?Musculoskeletal:     ?   General: No tenderness.  ?Skin: ?   General: Skin is warm and dry.  ?Neurological:  ?   Mental Status: He is alert and oriented to person, place, and time.  ?Psychiatric:     ?   Behavior: Behavior normal.  ? ? ?ED Results / Procedures / Treatments   ?Labs ?(all labs ordered are listed, but only abnormal results are displayed) ?Labs Reviewed -  No data to display ? ?EKG ?None ? ?Radiology ?No results found. ? ?Procedures ?Procedures  ? ? ?Medications Ordered in ED ?Medications - No data to display ? ?ED Course/ Medical Decision Making/ A&P ?  ?                        ?Medical Decision Making ?Risk ?Prescription drug management. ? ? ?Patient with history of schizophrenia here voluntarily seeking medication assistance with his auditory and visual hallucinations.  He is calm and appropriate on evaluation in the department and not actively psychotic, suicidal or homicidal.  On record review he has been on respite all twice daily in the past.  Given that he has been off this medication for very long period of time will restart at once daily.  Discussed with patient behavioral health  for outpatient resources as well as return precautions for new or concerning symptoms.  Current clinical picture is not consistent with organic cause of his hallucinations. ? ? ? ? ? ? ? ?Final Clinical Impression(s) / ED Diagnoses ?Final diagnoses:  ?Medication management  ?Hallucinations  ? ? ?Rx / DC Orders ?ED Discharge Orders   ? ?      Ordered  ?  risperiDONE (RISPERDAL) 2 MG tablet  Daily at bedtime       ? 07/13/21 0246  ? ?  ?  ? ?  ? ? ?  ?Tilden Fossa, MD ?07/13/21 [phone] ? ?

## 2021-07-22 ENCOUNTER — Ambulatory Visit (HOSPITAL_COMMUNITY): Payer: Medicaid Other | Admitting: Student in an Organized Health Care Education/Training Program

## 2021-08-30 IMAGING — CT CT HEAD W/O CM
3 of 4 series · 17 of 30 positions shown, 18 images · non-contrast
Comparison: None.

CLINICAL DATA: Trauma.

EXAM:
CT HEAD WITHOUT CONTRAST
TECHNIQUE: Contiguous axial images were obtained from the base of the skull
through the vertex without intravenous contrast.

[Series 3: head without · axial · non-contrast · 0.42mm/px · z∈[-14,+61]mm · 3 of 31 slices shown, 4 images]
[im 8/31  brain]
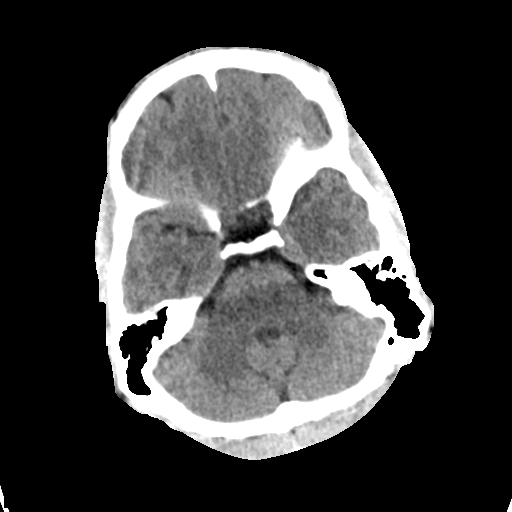
[im 8/31  bone]
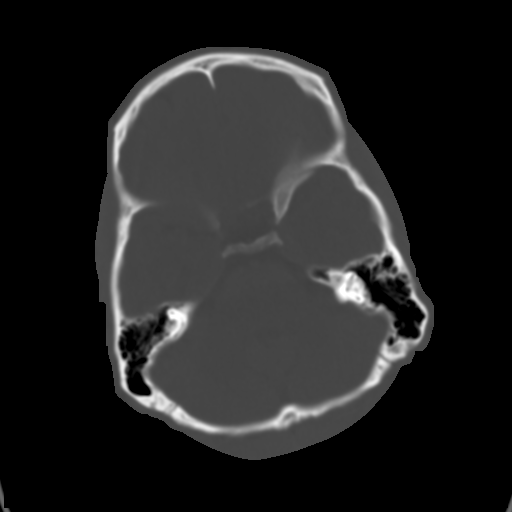
[im 16/31  brain]
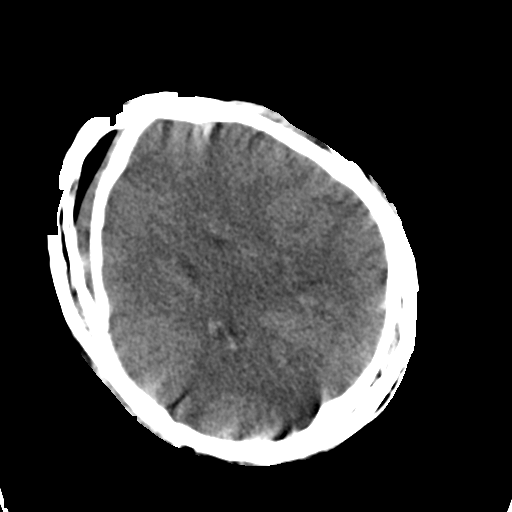
[im 23/31  brain]
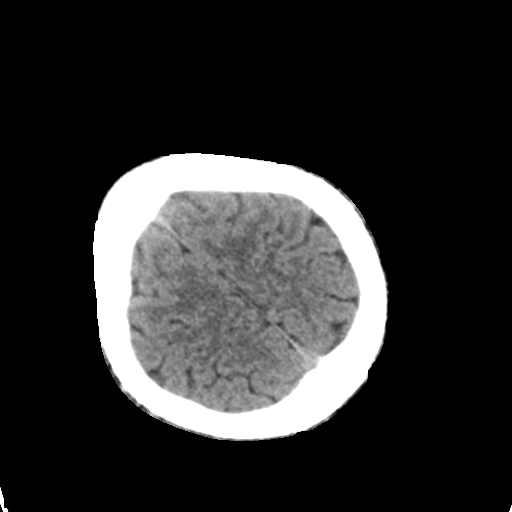

[Series 5: head bone · axial · 0.42mm/px · z∈[-39,+91]mm · 8 of 77 slices shown (1 of 2)]
[im 6/77  bone]
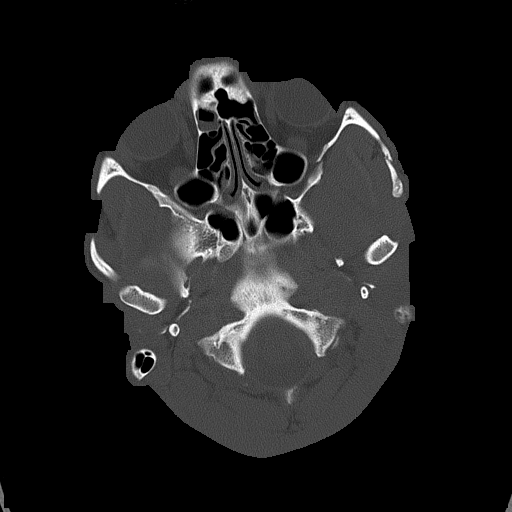
[im 18/77  bone]
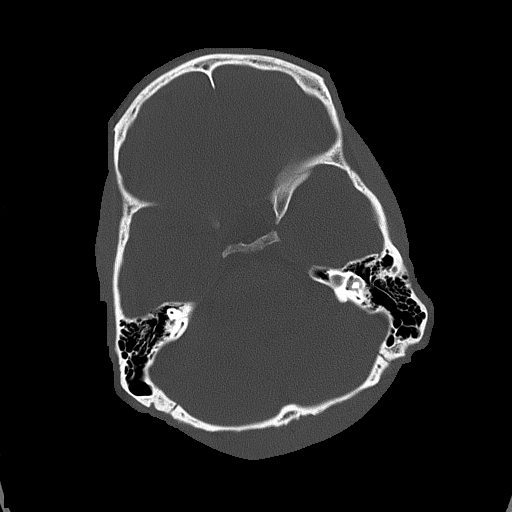
[im 24/77  bone]
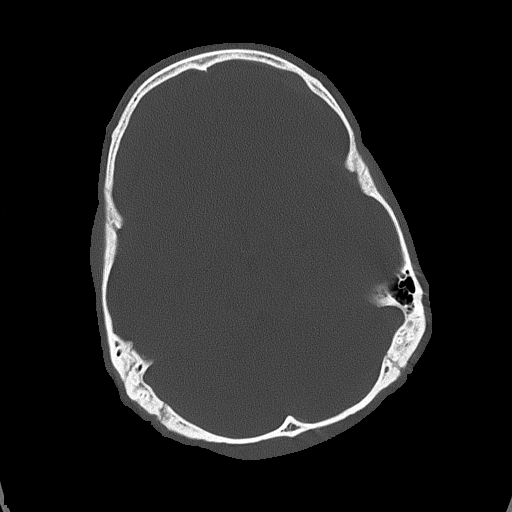
[im 36/77  bone]
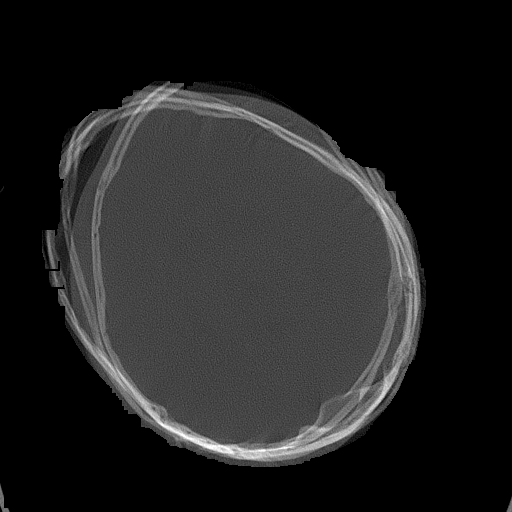
[im 41/77  bone]
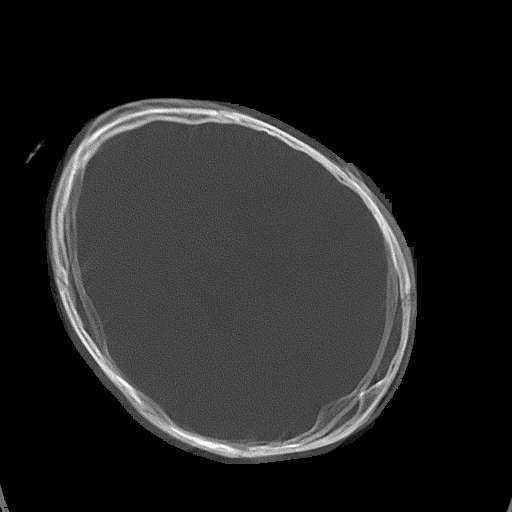
[im 53/77  bone]
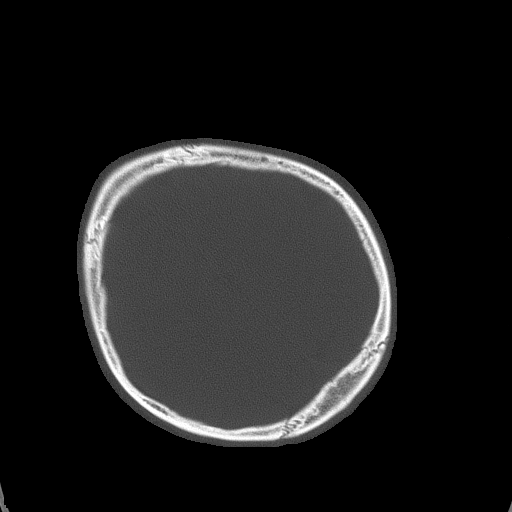
[im 59/77  bone]
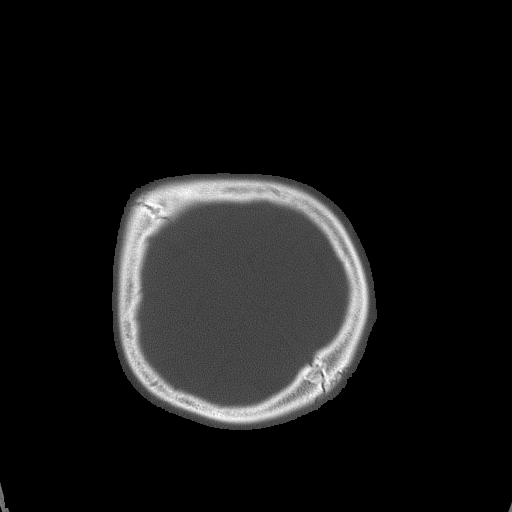
[im 71/77  bone]
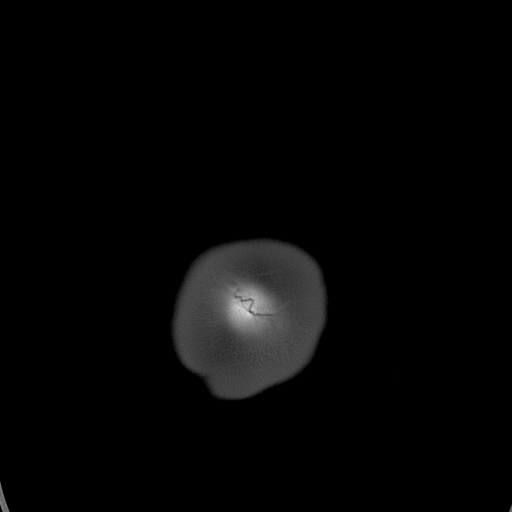

[Series 6: head bone · axial · 0.42mm/px · z∈[-27,+33]mm · 6 of 43 slices shown (2 of 2)]
[im 7/43  bone]
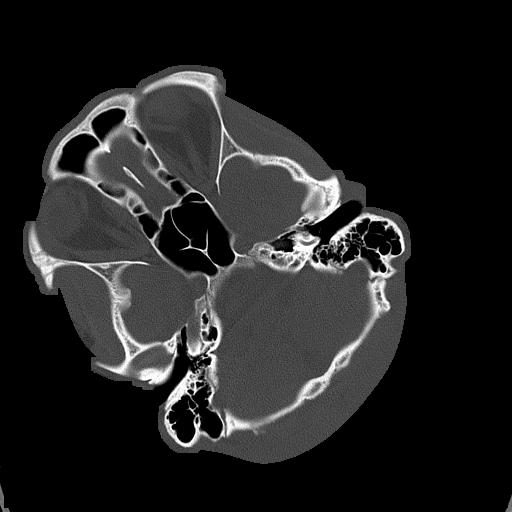
[im 13/43  bone]
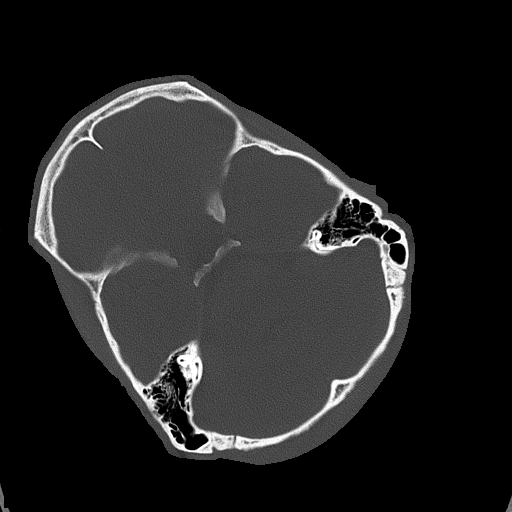
[im 19/43  bone]
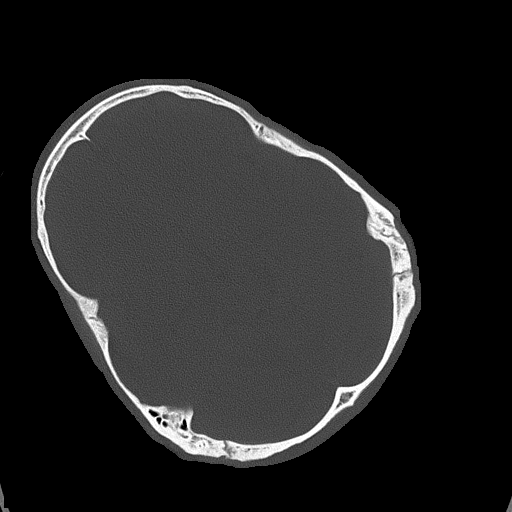
[im 25/43  bone]
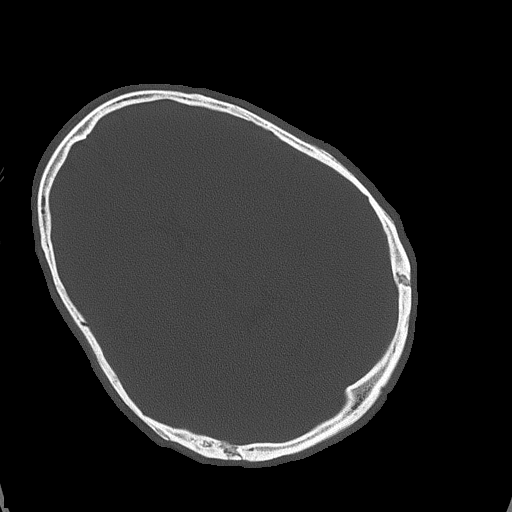
[im 31/43  bone]
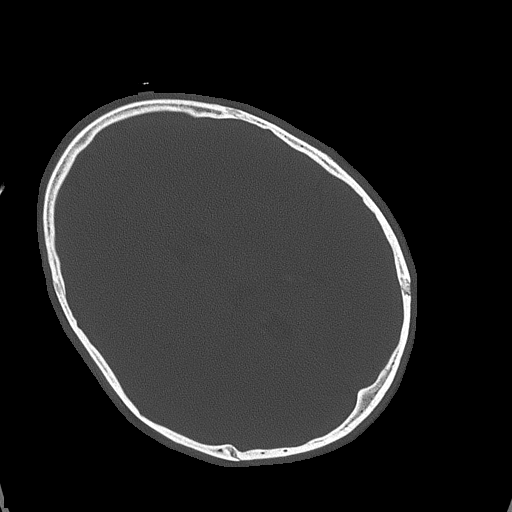
[im 37/43  bone]
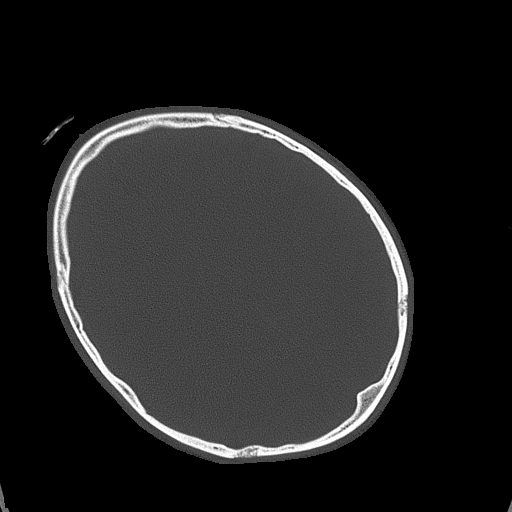

[17 of 30 positions shown; findings below may reference images not displayed]

FINDINGS: Brain: No evidence of acute infarction, hemorrhage, hydrocephalus,
extra-axial collection or mass lesion/mass effect.

Vascular: No hyperdense vessel or unexpected calcification.

Skull: Limited evaluation given patient motion without evidence of
acute fracture.

Sinuses/Orbits: Mild scattered paranasal sinus mucosal thickening
without visible air-fluid level. Unremarkable visualized orbits.

Other: Motion limited study.
IMPRESSION: Limited evaluation due to patient motion without evidence of acute
intracranial abnormality.

## 2021-09-01 ENCOUNTER — Other Ambulatory Visit: Payer: Self-pay

## 2021-09-01 ENCOUNTER — Encounter (HOSPITAL_COMMUNITY): Payer: Self-pay | Admitting: Emergency Medicine

## 2021-09-01 ENCOUNTER — Emergency Department (HOSPITAL_COMMUNITY)
Admission: EM | Admit: 2021-09-01 | Discharge: 2021-09-02 | Disposition: A | Discharge status: Home / Self Care | Payer: Medicaid Other | Attending: Emergency Medicine | Admitting: Emergency Medicine

## 2021-09-01 ENCOUNTER — Emergency Department (HOSPITAL_COMMUNITY): Payer: Medicaid Other

## 2021-09-01 DIAGNOSIS — S199XXA Unspecified injury of neck, initial encounter: Secondary | ICD-10-CM | POA: Diagnosis present

## 2021-09-01 DIAGNOSIS — Z79899 Other long term (current) drug therapy: Secondary | ICD-10-CM | POA: Insufficient documentation

## 2021-09-01 DIAGNOSIS — S161XXA Strain of muscle, fascia and tendon at neck level, initial encounter: Secondary | ICD-10-CM | POA: Insufficient documentation

## 2021-09-01 DIAGNOSIS — X58XXXA Exposure to other specified factors, initial encounter: Secondary | ICD-10-CM | POA: Diagnosis not present

## 2021-09-02 MED ORDER — IBUPROFEN 400 MG PO TABS
400.0000 mg | ORAL_TABLET | Freq: Once | ORAL | Status: AC
  Administered 2021-09-02: 400 mg via ORAL
  Filled 2021-09-02: qty 1

## 2021-09-02 MED ORDER — CYCLOBENZAPRINE HCL 5 MG PO TABS: 5.0000 mg | ORAL_TABLET | Freq: Three times a day (TID) | ORAL | 0 refills | Status: DC | PRN

## 2022-07-23 ENCOUNTER — Other Ambulatory Visit: Payer: Self-pay

## 2022-07-23 ENCOUNTER — Ambulatory Visit (HOSPITAL_COMMUNITY)
Admission: EM | Admit: 2022-07-23 | Discharge: 2022-07-23 | Disposition: A | Discharge status: Home / Self Care | Payer: Medicaid Other | Attending: Internal Medicine | Admitting: Internal Medicine

## 2022-07-23 ENCOUNTER — Encounter (HOSPITAL_COMMUNITY): Payer: Self-pay | Admitting: Emergency Medicine

## 2022-07-23 DIAGNOSIS — N390 Urinary tract infection, site not specified: Secondary | ICD-10-CM | POA: Diagnosis present

## 2022-07-23 DIAGNOSIS — Z113 Encounter for screening for infections with a predominantly sexual mode of transmission: Secondary | ICD-10-CM | POA: Diagnosis present

## 2022-07-23 LAB — POCT URINALYSIS DIP (MANUAL ENTRY)
Bilirubin, UA: NEGATIVE
Glucose, UA: NEGATIVE mg/dL
Ketones, POC UA: NEGATIVE mg/dL
Nitrite, UA: NEGATIVE
Spec Grav, UA: 1.015 (ref 1.010–1.025)
Urobilinogen, UA: 1 E.U./dL
pH, UA: 7 (ref 5.0–8.0)

## 2022-07-23 MED ORDER — CEFTRIAXONE SODIUM 500 MG IJ SOLR
500.0000 mg | INTRAMUSCULAR | Status: DC
  Administered 2022-07-23: 500 mg via INTRAMUSCULAR

## 2022-07-23 MED ORDER — DOXYCYCLINE HYCLATE 100 MG PO CAPS: 100.0000 mg | ORAL_CAPSULE | Freq: Two times a day (BID) | ORAL | 0 refills | Status: DC

## 2022-07-23 MED ORDER — LIDOCAINE HCL (PF) 1 % IJ SOLN
INTRAMUSCULAR | Status: AC
  Filled 2022-07-23: qty 2

## 2022-07-23 MED ORDER — CEFTRIAXONE SODIUM 500 MG IJ SOLR
INTRAMUSCULAR | Status: AC
  Filled 2022-07-23: qty 500

## 2022-07-23 NOTE — ED Notes (Signed)
At bedside for [name], np examination of patient

## 2022-07-23 NOTE — Discharge Instructions (Addendum)
I am covering for common causes of a urinary tract infection today in clinic.  We have given you a shot of Rocephin, I am sending you home on doxycycline twice daily for the next 10 days.  Please take all antibiotics with food to avoid gastrointestinal upset.  We have obtained a cytology swab screening for common sexually transmitted infections and we will call you if anything results is positive for if we need to change your treatment.  Please seek immediate care if you develop suddenly worsening pain, testicular swelling, emesis, or unable to hold down food or fluids, have a high fever, or no improvement in symptoms despite antibiotics.

## 2022-07-23 NOTE — ED Triage Notes (Signed)
Left lower quadrant abdominal pain.  Reports swelling to this area.  States pain started this morning.    Has not had any medications for symptoms
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
## 2022-07-24 LAB — CYTOLOGY, (ORAL, ANAL, URETHRAL) ANCILLARY ONLY
Chlamydia: NEGATIVE
Comment: NEGATIVE
Comment: NEGATIVE
Comment: NORMAL
Neisseria Gonorrhea: NEGATIVE
Trichomonas: NEGATIVE

## 2022-07-25 LAB — URINE CULTURE: Culture: <10000 — AB

## 2023-10-06 ENCOUNTER — Emergency Department (HOSPITAL_COMMUNITY): Payer: MEDICAID

## 2023-10-06 ENCOUNTER — Other Ambulatory Visit: Payer: Self-pay

## 2023-10-06 ENCOUNTER — Emergency Department (HOSPITAL_COMMUNITY)
Admission: EM | Admit: 2023-10-06 | Discharge: 2023-10-07 | Disposition: A | Discharge status: Other Acute Inpatient Hospital | Payer: MEDICAID | Attending: Emergency Medicine | Admitting: Emergency Medicine

## 2023-10-06 DIAGNOSIS — F411 Generalized anxiety disorder: Secondary | ICD-10-CM | POA: Diagnosis not present

## 2023-10-06 DIAGNOSIS — F1910 Other psychoactive substance abuse, uncomplicated: Secondary | ICD-10-CM | POA: Diagnosis present

## 2023-10-06 DIAGNOSIS — F1210 Cannabis abuse, uncomplicated: Secondary | ICD-10-CM | POA: Insufficient documentation

## 2023-10-06 DIAGNOSIS — R509 Fever, unspecified: Secondary | ICD-10-CM | POA: Insufficient documentation

## 2023-10-06 DIAGNOSIS — F1510 Other stimulant abuse, uncomplicated: Secondary | ICD-10-CM | POA: Diagnosis not present

## 2023-10-06 DIAGNOSIS — E860 Dehydration: Secondary | ICD-10-CM | POA: Insufficient documentation

## 2023-10-06 DIAGNOSIS — R000 Tachycardia, unspecified: Secondary | ICD-10-CM | POA: Insufficient documentation

## 2023-10-06 DIAGNOSIS — F200 Paranoid schizophrenia: Secondary | ICD-10-CM | POA: Diagnosis not present

## 2023-10-06 DIAGNOSIS — F17210 Nicotine dependence, cigarettes, uncomplicated: Secondary | ICD-10-CM | POA: Diagnosis not present

## 2023-10-06 DIAGNOSIS — F29 Unspecified psychosis not due to a substance or known physiological condition: Secondary | ICD-10-CM | POA: Insufficient documentation

## 2023-10-06 LAB — CBC WITH DIFFERENTIAL/PLATELET
Abs Immature Granulocytes: 0.08 K/uL — ABNORMAL HIGH (ref 0.00–0.07)
Basophils Absolute: 0 K/uL (ref 0.0–0.1)
Basophils Relative: 0 %
Eosinophils Absolute: 0 K/uL (ref 0.0–0.5)
Eosinophils Relative: 0 %
HCT: 46.9 % (ref 39.0–52.0)
Hemoglobin: 15.9 g/dL (ref 13.0–17.0)
Immature Granulocytes: 1 %
Lymphocytes Relative: 9 %
Lymphs Abs: 1 K/uL (ref 0.7–4.0)
MCH: 29.9 pg (ref 26.0–34.0)
MCHC: 33.9 g/dL (ref 30.0–36.0)
MCV: 88.2 fL (ref 80.0–100.0)
Monocytes Absolute: 0.6 K/uL (ref 0.1–1.0)
Monocytes Relative: 5 %
Neutro Abs: 9.9 K/uL — ABNORMAL HIGH (ref 1.7–7.7)
Neutrophils Relative %: 85 %
Platelets: 163 K/uL (ref 150–400)
RBC: 5.32 MIL/uL (ref 4.22–5.81)
RDW: 13 % (ref 11.5–15.5)
WBC: 11.6 K/uL — ABNORMAL HIGH (ref 4.0–10.5)
nRBC: 0 % (ref 0.0–0.2)

## 2023-10-06 LAB — I-STAT CG4 LACTIC ACID, ED
Lactic Acid, Venous: 3.7 mmol/L (ref 0.5–1.9)
Lactic Acid, Venous: 1 mmol/L (ref 0.5–1.9)

## 2023-10-06 LAB — RESP PANEL BY RT-PCR (RSV, FLU A&B, COVID)  RVPGX2
Influenza A by PCR: NEGATIVE
Influenza B by PCR: NEGATIVE
Resp Syncytial Virus by PCR: NEGATIVE
SARS Coronavirus 2 by RT PCR: NEGATIVE

## 2023-10-06 LAB — COMPREHENSIVE METABOLIC PANEL WITH GFR
ALT: 18 U/L (ref 0–44)
AST: 27 U/L (ref 15–41)
Albumin: 5 g/dL (ref 3.5–5.0)
Alkaline Phosphatase: 97 U/L (ref 38–126)
Anion gap: 14 (ref 5–15)
BUN: 13 mg/dL (ref 6–20)
CO2: 22 mmol/L (ref 22–32)
Calcium: 10.1 mg/dL (ref 8.9–10.3)
Chloride: 102 mmol/L (ref 98–111)
Creatinine, Ser: 1.45 mg/dL — ABNORMAL HIGH (ref 0.61–1.24)
GFR, Estimated: >60 mL/min (ref >60)
Glucose, Bld: 168 mg/dL — ABNORMAL HIGH (ref 70–99)
Potassium: 4 mmol/L (ref 3.5–5.1)
Sodium: 138 mmol/L (ref 135–145)
Total Bilirubin: 0.5 mg/dL (ref 0.0–1.2)
Total Protein: 8.2 g/dL — ABNORMAL HIGH (ref 6.5–8.1)

## 2023-10-06 LAB — LIPASE, BLOOD: Lipase: 27 U/L (ref 11–51)

## 2023-10-06 LAB — RAPID URINE DRUG SCREEN, HOSP PERFORMED
Amphetamines: POSITIVE — AB
Barbiturates: NOT DETECTED
Benzodiazepines: NOT DETECTED
Cocaine: NOT DETECTED
Opiates: NOT DETECTED
Tetrahydrocannabinol: POSITIVE — AB

## 2023-10-06 LAB — SALICYLATE LEVEL: Salicylate Lvl: <7 mg/dL — ABNORMAL LOW (ref 7.0–30.0)

## 2023-10-06 LAB — AMMONIA: Ammonia: 21 umol/L (ref 9–35)

## 2023-10-06 LAB — ETHANOL: Alcohol, Ethyl (B): <15 mg/dL (ref <15)

## 2023-10-06 LAB — OSMOLALITY: Osmolality: 297 mosm/kg — ABNORMAL HIGH (ref 275–295)

## 2023-10-06 LAB — ACETAMINOPHEN LEVEL: Acetaminophen (Tylenol), Serum: <10 ug/mL — ABNORMAL LOW (ref 10–30)

## 2023-10-06 MED ORDER — RISPERIDONE 0.5 MG PO TBDP
1.0000 mg | ORAL_TABLET | Freq: Two times a day (BID) | ORAL | Status: DC
  Administered 2023-10-06 – 2023-10-07 (×2): 1 mg via ORAL
  Filled 2023-10-06 (×3): qty 2

## 2023-10-06 MED ORDER — HYDROXYZINE HCL 25 MG PO TABS: 25.0000 mg | ORAL_TABLET | Freq: Three times a day (TID) | ORAL | Status: DC | PRN

## 2023-10-06 MED ORDER — DIPHENHYDRAMINE HCL 50 MG/ML IJ SOLN
50.0000 mg | Freq: Once | INTRAMUSCULAR | Status: AC
  Administered 2023-10-06: 50 mg via INTRAMUSCULAR
  Filled 2023-10-06: qty 1

## 2023-10-06 MED ORDER — LORAZEPAM 1 MG PO TABS
2.0000 mg | ORAL_TABLET | Freq: Once | ORAL | Status: AC
  Administered 2023-10-06: 2 mg via ORAL
  Filled 2023-10-06: qty 2

## 2023-10-06 MED ORDER — SODIUM CHLORIDE 0.9 % IV BOLUS
1000.0000 mL | Freq: Once | INTRAVENOUS | Status: AC
  Administered 2023-10-06: 1000 mL via INTRAVENOUS

## 2023-10-06 MED ORDER — DROPERIDOL 2.5 MG/ML IJ SOLN
2.5000 mg | Freq: Once | INTRAMUSCULAR | Status: AC
  Administered 2023-10-06: 2.5 mg via INTRAMUSCULAR
  Filled 2023-10-06: qty 2

## 2023-10-06 NOTE — ED Notes (Signed)
 Pt refused to take the ativan  after I handed him the medication. The pt states he cannot take that medication.
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
## 2023-10-06 NOTE — Progress Notes (Signed)
 Pt has been accepted to [facility] on 10/06/2023 . Unit  assignment:[place]   Pt meets inpatient criteria per Cathaleen Adam, NP    Attending Physician will be Dr. Jess   Report can be called to: [phone]   Pt can arrive after 8 AM   Care Team Notified: Sydelle Kerns, RN

## 2023-10-06 NOTE — ED Notes (Signed)
 Provider notified of patient HR at this time. No new orders.

## 2023-10-06 NOTE — ED Notes (Signed)
 [place]

## 2023-10-06 NOTE — Progress Notes (Signed)
 Inpatient Psychiatric Referral  Patient was recommended inpatient per Jadeka Motley-Mangrum, NP . There are no available beds at [facility], per [facility] Cherylynn Ernst, RN . Patient was referred to the following out of network facilities:  Destination  Service Provider Request Status Address Phone Fax  [facility]  Pending - Request Sent [address] [place] [address] [phone]  [facility]  Pending - Request Sent [address] [place] [address] [phone]  [facility]  Pending - Request Sent [address] [place] [address] [phone]  [facility]  Pending - Request Sent [address], [place] [address] [phone]  [facility]  Pending - Request Sent [address] [place] [address] [phone]  [facility]  Pending - Request Sent [address] [place] [phone]  [facility]  Pending - Request Sent [address] [place] [address] [phone]  [facility]  Pending - Request Sent [address], [place] [address] [phone]  [facility]  Pending - Request Sent [address], [place] [address] [phone]    Situation ongoing, CSW to continue following and update chart as more information becomes available.  Harrie Sofia MSW, LCSWA 10/06/2023  5:44PM
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
## 2023-10-06 NOTE — ED Triage Notes (Signed)
 Patient brought in from parents house for seeing things agitated and running around parking lot took delta 8 gummy from store and smoked some weed. Patient calm and redirectable. No meds in 2 days hx schizophrenia

## 2023-10-06 NOTE — ED Notes (Signed)
  Pt has been accepted to [facility] on 10/06/2023 . Unit  assignment:[place]    Pt meets inpatient criteria per Cathaleen Adam, NP     Attending Physician will be Dr. Jess    Report can be called to: [phone]    Pt can arrive after 8 AM    Care Team Notified:

## 2023-10-07 NOTE — ED Notes (Signed)
Sheriff called for transport  

## 2023-10-07 NOTE — ED Provider Notes (Signed)
 Emergency Medicine Observation Re-evaluation Note  Jeremy Taylor is a 23 y.o. male, seen on rounds today.  Pt initially presented to the ED for complaints of Paranoid Currently, the patient is resting comfortable.  Physical Exam  BP 111/76 (BP Location: Left Arm)   Pulse 85   Temp 98.7 F (37.1 C) (Oral)   Resp 18   SpO2 100%  Physical Exam  ED Course / MDM  EKG:EKG Interpretation Date/Time:  Tuesday October 06 2023 07:31:08 EDT Ventricular Rate:  132 PR Interval:  152 QRS Duration:  90 QT Interval:  288 QTC Calculation: 427 R Axis:   89  Text Interpretation: Sinus tachycardia Consider right atrial enlargement ST elev, probable normal early repol pattern similar to prior, rate increased Confirmed by Elnor Savant ([id number]) on 10/06/2023 1:44:58 PM  I have reviewed the labs performed to date as well as medications administered while in observation.  Recent changes in the last 24 hours include as below.  Plan  Current plan is for going to [facility] today for inpatient psychiatric treatment.    Dasie Faden, MD 10/07/23 [phone]

## 2023-10-07 NOTE — ED Notes (Signed)
 Report called to [facility] RN

## 2023-10-11 LAB — CULTURE, BLOOD (ROUTINE X 2): Culture: NO GROWTH

## 2023-10-19 ENCOUNTER — Emergency Department (HOSPITAL_COMMUNITY)
Admission: EM | Admit: 2023-10-19 | Discharge: 2023-10-20 | Disposition: A | Discharge status: Other Acute Inpatient Hospital | Payer: MEDICAID | Attending: Emergency Medicine | Admitting: Emergency Medicine

## 2023-10-19 ENCOUNTER — Emergency Department (HOSPITAL_COMMUNITY): Payer: MEDICAID

## 2023-10-19 ENCOUNTER — Other Ambulatory Visit: Payer: Self-pay

## 2023-10-19 DIAGNOSIS — Y92009 Unspecified place in unspecified non-institutional (private) residence as the place of occurrence of the external cause: Secondary | ICD-10-CM | POA: Insufficient documentation

## 2023-10-19 DIAGNOSIS — Z79899 Other long term (current) drug therapy: Secondary | ICD-10-CM | POA: Insufficient documentation

## 2023-10-19 DIAGNOSIS — F259 Schizoaffective disorder, unspecified: Secondary | ICD-10-CM | POA: Diagnosis not present

## 2023-10-19 DIAGNOSIS — F23 Brief psychotic disorder: Secondary | ICD-10-CM

## 2023-10-19 DIAGNOSIS — M25551 Pain in right hip: Secondary | ICD-10-CM | POA: Diagnosis not present

## 2023-10-19 DIAGNOSIS — W19XXXA Unspecified fall, initial encounter: Secondary | ICD-10-CM | POA: Diagnosis not present

## 2023-10-19 DIAGNOSIS — M25561 Pain in right knee: Secondary | ICD-10-CM | POA: Diagnosis not present

## 2023-10-19 DIAGNOSIS — F200 Paranoid schizophrenia: Secondary | ICD-10-CM | POA: Diagnosis present

## 2023-10-19 DIAGNOSIS — R462 Strange and inexplicable behavior: Secondary | ICD-10-CM | POA: Diagnosis present

## 2023-10-19 LAB — CBC WITH DIFFERENTIAL/PLATELET
Abs Immature Granulocytes: 0.02 K/uL (ref 0.00–0.07)
Basophils Absolute: 0 K/uL (ref 0.0–0.1)
Basophils Relative: 0 %
Eosinophils Absolute: 0 K/uL (ref 0.0–0.5)
Eosinophils Relative: 0 %
HCT: 43 % (ref 39.0–52.0)
Hemoglobin: 14.5 g/dL (ref 13.0–17.0)
Immature Granulocytes: 0 %
Lymphocytes Relative: 32 %
Lymphs Abs: 2.3 K/uL (ref 0.7–4.0)
MCH: 29.4 pg (ref 26.0–34.0)
MCHC: 33.7 g/dL (ref 30.0–36.0)
MCV: 87 fL (ref 80.0–100.0)
Monocytes Absolute: 0.5 K/uL (ref 0.1–1.0)
Monocytes Relative: 7 %
Neutro Abs: 4.3 K/uL (ref 1.7–7.7)
Neutrophils Relative %: 61 %
Platelets: 154 K/uL (ref 150–400)
RBC: 4.94 MIL/uL (ref 4.22–5.81)
RDW: 12.8 % (ref 11.5–15.5)
WBC: 7.1 K/uL (ref 4.0–10.5)
nRBC: 0 % (ref 0.0–0.2)

## 2023-10-19 LAB — COMPREHENSIVE METABOLIC PANEL WITH GFR
ALT: 37 U/L (ref 0–44)
AST: 21 U/L (ref 15–41)
Albumin: 4.5 g/dL (ref 3.5–5.0)
Alkaline Phosphatase: 89 U/L (ref 38–126)
Anion gap: 12 (ref 5–15)
BUN: 13 mg/dL (ref 6–20)
CO2: 24 mmol/L (ref 22–32)
Calcium: 9.7 mg/dL (ref 8.9–10.3)
Chloride: 100 mmol/L (ref 98–111)
Creatinine, Ser: 0.88 mg/dL (ref 0.61–1.24)
GFR, Estimated: >60 mL/min (ref >60)
Glucose, Bld: 106 mg/dL — ABNORMAL HIGH (ref 70–99)
Potassium: 4.1 mmol/L (ref 3.5–5.1)
Sodium: 136 mmol/L (ref 135–145)
Total Bilirubin: 0.8 mg/dL (ref 0.0–1.2)
Total Protein: 7.2 g/dL (ref 6.5–8.1)

## 2023-10-19 LAB — RAPID URINE DRUG SCREEN, HOSP PERFORMED
Amphetamines: NOT DETECTED
Barbiturates: NOT DETECTED
Benzodiazepines: POSITIVE — AB
Cocaine: NOT DETECTED
Opiates: NOT DETECTED
Tetrahydrocannabinol: NOT DETECTED

## 2023-10-19 LAB — TROPONIN I (HIGH SENSITIVITY): Troponin I (High Sensitivity): 11 ng/L (ref <18)

## 2023-10-19 LAB — ETHANOL: Alcohol, Ethyl (B): <15 mg/dL (ref <15)

## 2023-10-19 MED ORDER — MIDAZOLAM HCL 2 MG/2ML IJ SOLN
4.0000 mg | Freq: Once | INTRAMUSCULAR | Status: AC
  Administered 2023-10-19 (×2): 4 mg via INTRAMUSCULAR
  Filled 2023-10-19: qty 4

## 2023-10-19 MED ORDER — ACETAMINOPHEN 500 MG PO TABS: 1000.0000 mg | ORAL_TABLET | Freq: Once | ORAL | Status: DC

## 2023-10-19 MED ORDER — RISPERIDONE 1 MG PO TABS
2.0000 mg | ORAL_TABLET | Freq: Every day | ORAL | Status: DC
  Administered 2023-10-19 (×2): 2 mg via ORAL
  Filled 2023-10-19: qty 2

## 2023-10-19 MED ORDER — ASPIRIN 81 MG PO CHEW
324.0000 mg | CHEWABLE_TABLET | Freq: Once | ORAL | Status: AC
  Administered 2023-10-19 (×2): 324 mg via ORAL
  Filled 2023-10-19: qty 4

## 2023-10-19 MED ORDER — OLANZAPINE 10 MG IM SOLR
5.0000 mg | Freq: Once | INTRAMUSCULAR | Status: AC
  Administered 2023-10-19 (×2): 5 mg via INTRAMUSCULAR
  Filled 2023-10-19: qty 10

## 2023-10-19 MED ORDER — HYDROXYZINE HCL 25 MG PO TABS: 25.0000 mg | ORAL_TABLET | Freq: Three times a day (TID) | ORAL | Status: DC | PRN

## 2023-10-19 NOTE — ED Notes (Signed)
 IVC CASE # [id number] PAPERWORK COMPLETED COPIES MADE ORIGINAL IN THE RED FOLDER PAPERWORK NOW ATTACHED TO THE CLIP BOARD IN PURPLE

## 2023-10-19 NOTE — ED Notes (Signed)
 CALLED  SHERIFF TO TX PATIENT IN THE A. M.

## 2023-10-19 NOTE — ED Notes (Signed)
 IVC FINDINGS AND CUSTODY UPLOADED TO THE CHART AND SENT TO THE COURT OF CLERK
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
## 2023-10-19 NOTE — ED Notes (Signed)
 IVC paperwork sent to magistrate it was excepted waiting for gpd to bring the findings and custody  paperwork will remain with me until gpd bring the final documentation. Paperwork attached to the clipboard in blue zone
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
## 2023-10-19 NOTE — ED Notes (Signed)
 Patient clothes and shoes in locker 2. Apple phone and brown wallet with cards, and money are in security.

## 2023-10-19 NOTE — Progress Notes (Signed)
 Inpatient Psychiatric Referral  Patient was recommended inpatient per Elveria Batter, NP. There are no available beds at [facility], per [facility] . Patient was referred to the following out of network facilities:  Destination  Service Provider Request Status Address Phone Fax  [facility]  Pending - Request Sent [address], [place] [address] [phone]  [facility]  Pending - Request Sent [address] [place] [address] [phone]  [facility]  Pending - Request Sent [address] [place] [address] [phone]  [facility]  Pending - Request Sent [address] [place] [address] [phone]  [facility]  Pending - Request Sent [address] [place] [address] [phone]  [facility]  Pending - Request Sent [address], [place] [address] [phone]  [facility]  Pending - Request Sent [address] [place] [phone]  [facility]  Pending - Request Sent [address] [place] [address] [phone]    Situation ongoing, CSW to continue following and update chart as more information becomes available. Harrie Sofia MSW, LCSWA 10/19/2023  1:40PM

## 2023-10-19 NOTE — Progress Notes (Signed)
 Pt has been accepted to [facility] on 10/19/2023 Bed assignment: Main campus  Pt meets inpatient criteria per Elveria Batter, NP   Attending Physician will be Millie Manners, MD  Report can be called to: [phone] (this is a pager, please leave call-back number when giving report)  Pt can arrive after 8 AM  Care Team Notified:  Elveria Batter, NP, Flavia Blackbird, RN

## 2023-10-19 NOTE — ED Provider Notes (Signed)
 [place] EMERGENCY DEPARTMENT AT [facility] Provider Note   CSN: [id number] Arrival date & time: 10/19/23  9342     Patient presents with: Leg Injury   Jeremy Taylor is a 23 y.o. male.  With a history of paranoid schizophrenia and substance abuse who presents to the ED with reported complaints of leg pain.  Patient was discharged yesterday from psychiatric facility to a group home.  He reports that he fell on the porch at the home and injured his right lower extremity.  EMS noted that he exhibited disorganized behavior prior to arrival.  In the ED he is repeatedly screaming I am not a sex offender.  He is unable to recall the details of his fall this morning but reports right knee pain right hip pain.  Denies SI HI.  Additional history limited at this time secondary to disorganized thought processes   HPI     Prior to Admission medications   Medication Sig Start Date End Date Taking? Authorizing Provider  acetaminophen  (TYLENOL ) 500 MG tablet Take 500 mg by mouth every 6 (six) hours as needed for moderate pain or headache.    [provider]  doxycycline  (VIBRAMYCIN ) 100 MG capsule Take 1 capsule (100 mg total) by mouth 2 (two) times daily. 07/23/22   Dreama, [place]  N, FNP  risperiDONE  (RISPERDAL ) 2 MG tablet Take 1 tablet (2 mg total) by mouth at bedtime. Patient not taking: Reported on 07/23/2022 07/13/21   Griselda Norris, MD  ARIPiprazole  (ABILIFY ) 15 MG tablet Take 1 tablet (15 mg total) by mouth at bedtime. For mood control Patient not taking: Reported on 04/01/2020 03/02/20 04/01/20  Collene Gouge I, NP  sertraline  (ZOLOFT ) 25 MG tablet Take 1 tablet (25 mg total) by mouth daily. For depression Patient not taking: Reported on 04/01/2020 03/03/20 04/01/20  Collene Gouge I, NP  traZODone  (DESYREL ) 50 MG tablet Take 1 tablet (50 mg total) by mouth at bedtime as needed for sleep. Patient not taking: Reported on 04/01/2020 03/02/20 04/01/20  Collene Gouge I, NP     Allergies: Patient has no known allergies.    Review of Systems  Updated Vital Signs BP 120/85   Pulse 64   Temp (!) 97.3 F (36.3 C) (Axillary)   Resp 16   Ht 5' 9 (1.753 m)   Wt 71.7 kg   SpO2 99%   BMI 23.33 kg/m   Physical Exam Vitals and nursing note reviewed.  HENT:     Head: Normocephalic and atraumatic.  Eyes:     Pupils: Pupils are equal, round, and reactive to light.  Cardiovascular:     Rate and Rhythm: Normal rate and regular rhythm.  Pulmonary:     Effort: Pulmonary effort is normal.     Breath sounds: Normal breath sounds.  Abdominal:     Palpations: Abdomen is soft.     Tenderness: There is no abdominal tenderness.  Musculoskeletal:     Comments: Full active range of motion of the right knee and right hip No evidence of external trauma No swelling erythema abrasions or lacerations Tenderness to anterior palpation of the right hip right knee  Skin:    General: Skin is warm and dry.  Neurological:     Mental Status: He is alert.  Psychiatric:     Comments: Disorganized and paranoid thought processes     (all labs ordered are listed, but only abnormal results are displayed) Labs Reviewed  COMPREHENSIVE METABOLIC PANEL WITH GFR - Abnormal; Notable for  the following components:      Result Value   Glucose, Bld 106 (*)    All other components within normal limits  ETHANOL  CBC WITH DIFFERENTIAL/PLATELET  RAPID URINE DRUG SCREEN, HOSP PERFORMED  TROPONIN I (HIGH SENSITIVITY)    EKG: EKG Interpretation Date/Time:  Monday October 19 2023 07:57:38 EDT Ventricular Rate:  104 PR Interval:  140 QRS Duration:  92 QT Interval:  330 QTC Calculation: 434 R Axis:   93  Text Interpretation: Sinus tachycardia Consider right atrial enlargement Anterolateral ST elevation, probable early repolarization Confirmed by Pamella Sharper [phone]) on 10/19/2023 8:13:00 AM  Radiology: ARCOLA Knee Complete 4 Views Right Result Date: 10/19/2023 EXAM: 4 VIEW(S) XRAY  OF THE RIGHT KNEE 10/19/2023 08:32:00 AM COMPARISON: None available. CLINICAL HISTORY: [id number] Hip pain [id number]. Right Hip Pain 124973 Hip pain 124973. Right Hip Pain FINDINGS: BONES AND JOINTS: No acute fracture. No focal osseous lesion. No joint dislocation. No significant joint effusion. No significant degenerative changes. SOFT TISSUES: The soft tissues are unremarkable. IMPRESSION: 1. No significant abnormality. Electronically signed by: Waddell Calk MD 10/19/2023 08:52 AM EDT RP Workstation: [id number]   DG Pelvis Portable Result Date: 10/19/2023 EXAM: 1 or 2 VIEW(S) XRAY OF THE PELVIS 10/19/2023 08:32:00 AM COMPARISON: None available. CLINICAL HISTORY: R hip pain. Right Hip Pain. R hip pain. Right Hip Pain. FINDINGS: BONES AND JOINTS: No acute fracture. No focal osseous lesion. No joint dislocation. SOFT TISSUES: The soft tissues are unremarkable. IMPRESSION: 1. No significant abnormality. Electronically signed by: Waddell Calk MD 10/19/2023 08:49 AM EDT RP Workstation: [id number]     Procedures   Medications Ordered in the ED  OLANZapine  (ZYPREXA ) injection 5 mg (5 mg Intramuscular Given 10/19/23 0741)  midazolam  (VERSED ) injection 4 mg (4 mg Intramuscular Given 10/19/23 0741)  aspirin  chewable tablet 324 mg (324 mg Oral Given 10/19/23 1226)    Clinical Course as of 10/19/23 1230  Mon Oct 19, 2023  0802 My independent interpretation of twelve-lead EKG.  Anterior ST elevations seen on today's EKG can also be found going back to EKGs of 2022.  No reported chest pain.  Low suspicion for ACS at this time but will send high-sensitivity troponin to evaluate.  Will provide 324 mg aspirin  and continue to monitor on telemetry [MP]  1229 Initial troponin of 11 not consistent with ACS or ischemia.  Remainder of laboratory workup unremarkable.  Still waiting on UA.  Right knee x-ray pelvis x-ray showed no acute traumatic findings.  Patient medically cleared for psychiatric evaluation [MP]    Clinical  Course User Index [MP] Pamella Sharper LABOR, DO                                 Medical Decision Making 23 year old male recently seen in this emergency department and subsequently admitted to inpatient psychiatric facility for exacerbation of paranoid schizophrenia returns with stated complaint of leg pain.  He was apparently discharged from a psychiatric facility and is now residing at a group home.  He is agitated with paranoid delusions repeatedly screaming out I am not a sex offender.  He does not have insight into his condition or decision making capacity at this time.  Regarding the stated complaint of right lower extremity pain, we will obtain a plain film of the right hip and right knee where he is reporting that he fell and now has pain although there is no external evidence of trauma.  We will need to obtain EKG and labs per psych protocol prior to TTS evaluation.  Given that he has no insight into his condition and is exhibiting signs of recurrent psychosis, we will enact involuntary commitment orders.  We are unable to redirect him or de-escalate him verbally, will give IM midazolam  IM olanzapine  for agitation and psychosis  Amount and/or Complexity of Data Reviewed Labs: ordered. Radiology: ordered.  Risk OTC drugs. Prescription drug management.        Final diagnoses:  Schizoaffective disorder, unspecified type [facility])    ED Discharge Orders     None          Pamella Ozell LABOR, DO 10/19/23 1230

## 2023-10-19 NOTE — ED Notes (Signed)
 Pt placed on End Tidal CO2 per provider order

## 2023-10-20 NOTE — ED Notes (Signed)
 [facility] paged to give report.

## 2023-10-20 NOTE — Discharge Instructions (Addendum)
 Transfer/transport to [facility].

## 2023-10-20 NOTE — ED Notes (Signed)
 EDP into see

## 2023-10-20 NOTE — ED Notes (Signed)
 Received call from SD, deputy will arrive in ~ 1 hr. Initiating transfer process. EDP in to see. EDP, CN and security notified.

## 2023-10-20 NOTE — ED Provider Notes (Signed)
 Emergency Medicine Observation Re-evaluation Note  Jeremy Taylor is a 23 y.o. male, seen on rounds today.  Pt initially presented to the ED for complaints of acute psychosis. Pt with recent bh hospitalization, but did not maintain adherence to med therapy post d/c from there and had return of similar symptoms. Pt currently calm, no distress.   Physical Exam  BP 110/73 (BP Location: Right Arm)   Pulse 70   Temp 98 F (36.7 C) (Oral)   Resp 14   Ht 1.753 m (5' 9)   Wt 71.7 kg   SpO2 98%   BMI 23.33 kg/m  Physical Exam General: resting, easily aroused, alert, no new c/o. Cardiac: regular rate. Lungs: breathing comfortably. Psych: normal mood and affect. Does not appear to be actively responding to internal stimuli.   ED Course / MDM    I have reviewed the labs performed to date as well as medications administered while in observation.  Recent changes in the last 24 hours include ED obs, reassessment. .  Plan  Pt has been accepted at [facility], Dr Ruther.   Vitals:   10/19/23 1846 10/20/23 0621  BP: 127/86 110/73  Pulse: 79 70  Resp: 18 14  Temp: 98.2 F (36.8 C) 98 F (36.7 C)  SpO2: 99% 98%    Pt currently appears stable for transfer/transport.    Jeremy Galentine, MD 10/20/23 [phone]

## 2023-10-20 NOTE — ED Notes (Signed)
 All belongings and valuables given to [employer]. Pt alert, NAD, calm, cooperative.  Steady gait. Denies questions or needs. VSS.

## 2023-10-20 NOTE — ED Notes (Signed)
 IVC is current

## 2023-10-20 NOTE — ED Notes (Signed)
 Attempted to call report x3, left voice  message and call back number

## 2023-10-20 NOTE — ED Notes (Signed)
 Paged to give report, number left a 2nd time, pending call back.

## 2023-10-20 NOTE — ED Notes (Signed)
 Spoke to [employer] about pt pick
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
## 2023-12-02 NOTE — Telephone Encounter (Signed)
 Pt called in from mail I had sent him to call us  asap to get hardware removed from mouth. However pt hung up when he was put on hold for me to be alerted to answer his call. When I tried to call back, I had to leave message for him to call us  back.

## 2023-12-02 NOTE — Telephone Encounter (Signed)
 I called the [facility] due to pt has an up coming appt. W/them. I have been unable to contact pt, have not gotten a return call from the 1 phone # of his that allowed me to leave a message and his mother after I spoke with her, she wanted to call her back and leave detailed message on her voice mail that was full, I left message with [facility] to hopefully make contact.Dr Ethyl needs to get his hardware off.

## 2023-12-17 ENCOUNTER — Emergency Department (HOSPITAL_COMMUNITY)
Admission: EM | Admit: 2023-12-17 | Discharge: 2023-12-18 | Disposition: A | Discharge status: Home / Self Care | Payer: MEDICAID | Attending: Emergency Medicine | Admitting: Emergency Medicine

## 2023-12-17 ENCOUNTER — Other Ambulatory Visit: Payer: Self-pay

## 2023-12-17 DIAGNOSIS — Z760 Encounter for issue of repeat prescription: Secondary | ICD-10-CM | POA: Diagnosis present

## 2023-12-17 LAB — COMPREHENSIVE METABOLIC PANEL WITH GFR
ALT: 21 U/L (ref 0–44)
AST: 40 U/L (ref 15–41)
Albumin: 5.2 g/dL — ABNORMAL HIGH (ref 3.5–5.0)
Alkaline Phosphatase: 101 U/L (ref 38–126)
Anion gap: 17 — ABNORMAL HIGH (ref 5–15)
BUN: 11 mg/dL (ref 6–20)
CO2: 22 mmol/L (ref 22–32)
Calcium: 9.9 mg/dL (ref 8.9–10.3)
Chloride: 97 mmol/L — ABNORMAL LOW (ref 98–111)
Creatinine, Ser: 1.11 mg/dL (ref 0.61–1.24)
GFR, Estimated: >60 mL/min (ref >60)
Glucose, Bld: 100 mg/dL — ABNORMAL HIGH (ref 70–99)
Potassium: 4.2 mmol/L (ref 3.5–5.1)
Sodium: 136 mmol/L (ref 135–145)
Total Bilirubin: 0.5 mg/dL (ref 0.0–1.2)
Total Protein: 8 g/dL (ref 6.5–8.1)

## 2023-12-17 LAB — RAPID URINE DRUG SCREEN, HOSP PERFORMED
Amphetamines: POSITIVE — AB
Barbiturates: NOT DETECTED
Benzodiazepines: NOT DETECTED
Cocaine: NOT DETECTED
Opiates: NOT DETECTED
Tetrahydrocannabinol: POSITIVE — AB

## 2023-12-17 LAB — CBC
HCT: 43.2 % (ref 39.0–52.0)
Hemoglobin: 14.4 g/dL (ref 13.0–17.0)
MCH: 28.8 pg (ref 26.0–34.0)
MCHC: 33.3 g/dL (ref 30.0–36.0)
MCV: 86.4 fL (ref 80.0–100.0)
Platelets: 175 K/uL (ref 150–400)
RBC: 5 MIL/uL (ref 4.22–5.81)
RDW: 13.2 % (ref 11.5–15.5)
WBC: 12.5 K/uL — ABNORMAL HIGH (ref 4.0–10.5)
nRBC: 0 % (ref 0.0–0.2)

## 2023-12-17 LAB — ETHANOL: Alcohol, Ethyl (B): <15 mg/dL (ref <15)

## 2023-12-17 NOTE — ED Triage Notes (Signed)
 Patient requesting psychiatric evaluation , he has not seen his psychiatrist for 6 months and will run out of his medications this week , intermittent visual hallucinations , denies SI or HI .

## 2023-12-18 MED ORDER — RISPERIDONE 2 MG PO TABS: 2.0000 mg | ORAL_TABLET | Freq: Every day | ORAL | 0 refills | Status: AC

## 2023-12-18 MED ORDER — RISPERIDONE 2 MG PO TABS
2.0000 mg | ORAL_TABLET | Freq: Once | ORAL | Status: AC
  Administered 2023-12-18: 2 mg via ORAL
  Filled 2023-12-18: qty 1

## 2023-12-18 MED ORDER — TRAZODONE HCL 50 MG PO TABS: 50.0000 mg | ORAL_TABLET | Freq: Every day | ORAL | 0 refills | Status: AC

## 2023-12-18 NOTE — ED Provider Notes (Signed)
 [place] EMERGENCY DEPARTMENT AT [facility] Provider Note   CSN: [id number] Arrival date & time: 12/17/23  2011     Patient presents with: Psychiatric Evaluation    Jeremy Taylor is a 23 y.o. male patient with history of schizophrenia who presents to the emergency department today for further evaluation of medication refill.  Patient has not seen his psychiatric provider for approximately 2 months.  He has not taken his Risperdal  in 4 days as he has been out.  Patient requesting psychiatric evaluation.  He denies any suicidal or homicidal ideation.  He does endorse some intermittent visual hallucinations.  Denies any somatic complaints at this time.   HPI     Prior to Admission medications   Medication Sig Start Date End Date Taking? Authorizing Provider  risperiDONE  (RISPERDAL ) 2 MG tablet Take 1 tablet (2 mg total) by mouth daily. 12/18/23  Yes Saoirse Legere M, PA-C  traZODone  (DESYREL ) 50 MG tablet Take 1 tablet (50 mg total) by mouth at bedtime. 12/18/23  Yes Sagrario Lineberry M, PA-C  acetaminophen  (TYLENOL ) 500 MG tablet Take 500 mg by mouth every 6 (six) hours as needed for moderate pain or headache.    [provider]  ARIPiprazole  (ABILIFY ) 15 MG tablet Take 1 tablet (15 mg total) by mouth at bedtime. For mood control Patient not taking: Reported on 04/01/2020 03/02/20 04/01/20  Collene Gouge I, NP  sertraline  (ZOLOFT ) 25 MG tablet Take 1 tablet (25 mg total) by mouth daily. For depression Patient not taking: Reported on 04/01/2020 03/03/20 04/01/20  Collene Gouge I, NP    Allergies: Patient has no known allergies.    Review of Systems  All other systems reviewed and are negative.   Updated Vital Signs BP (!) 165/96 (BP Location: Left Arm)   Pulse (!) 117   Temp 98.4 F (36.9 C)   Resp 16   SpO2 98%   Physical Exam Vitals and nursing note reviewed.  Constitutional:      General: He is not in acute distress.    Appearance: Normal appearance.   HENT:     Head: Normocephalic and atraumatic.  Eyes:     General:        Right eye: No discharge.        Left eye: No discharge.  Cardiovascular:     Comments: Regular rate and rhythm.  S1/S2 are distinct without any evidence of murmur, rubs, or gallops.  Radial pulses are 2+ bilaterally.  Dorsalis pedis pulses are 2+ bilaterally.  No evidence of pedal edema. Pulmonary:     Comments: Clear to auscultation bilaterally.  Normal effort.  No respiratory distress.  No evidence of wheezes, rales, or rhonchi heard throughout. Abdominal:     General: Abdomen is flat. Bowel sounds are normal. There is no distension.     Tenderness: There is no abdominal tenderness. There is no guarding or rebound.  Musculoskeletal:        General: Normal range of motion.     Cervical back: Neck supple.  Skin:    General: Skin is warm and dry.     Findings: No rash.  Neurological:     General: No focal deficit present.     Mental Status: He is alert.  Psychiatric:        Mood and Affect: Mood normal.        Behavior: Behavior normal.     (all labs ordered are listed, but only abnormal results are displayed) Labs Reviewed  COMPREHENSIVE METABOLIC PANEL WITH GFR - Abnormal; Notable for the following components:      Result Value   Chloride 97 (*)    Glucose, Bld 100 (*)    Albumin  5.2 (*)    Anion gap 17 (*)    All other components within normal limits  CBC - Abnormal; Notable for the following components:   WBC 12.5 (*)    All other components within normal limits  RAPID URINE DRUG SCREEN, HOSP PERFORMED - Abnormal; Notable for the following components:   Amphetamines POSITIVE (*)    Tetrahydrocannabinol POSITIVE (*)    All other components within normal limits  ETHANOL    EKG: None  Radiology: No results found.   Procedures   Medications Ordered in the ED  risperiDONE  (RISPERDAL ) tablet 2 mg (has no administration in time range)     Medical Decision Making Clarion D Thielman is a  23 y.o. male patient who presents to the emergency department today for further evaluation of medication refill and psychiatric evaluation.  Patient does not meet inpatient criteria at this time for psychiatric evaluation.  He is not suicidal or homicidal.  Patient is endorsing some intermittent visual hallucinations but none right now.  Again no somatic complaints.  I will refill his trazodone  and Risperdal  for the next 30 days.  I will have him go to [facility] tomorrow for further evaluation.  Patient agreeable with plan.  Strict turn precautions were discussed.  He is safe for discharge.   Amount and/or Complexity of Data Reviewed Labs: ordered.  Risk Prescription drug management.     Final diagnoses:  Medication refill    ED Discharge Orders          Ordered    risperiDONE  (RISPERDAL ) 2 MG tablet  Daily        12/18/23 0321    traZODone  (DESYREL ) 50 MG tablet  Daily at bedtime        12/18/23 0321               Theotis Peers [place], PA-C 12/18/23 9671    Raford Lenis, MD 12/18/23 [phone]

## 2023-12-18 NOTE — Discharge Instructions (Signed)
 Please present to our behavioral health urgent care tomorrow where you can get formally seen by a psychiatric provider.  You can return to the emergency department for any worsening symptoms.

## 2023-12-21 ENCOUNTER — Ambulatory Visit: Payer: MEDICAID | Admitting: Family Medicine

## 2024-01-27 ENCOUNTER — Emergency Department (HOSPITAL_COMMUNITY)
Admission: EM | Admit: 2024-01-27 | Discharge: 2024-01-27 | Discharge status: Against Medical Advice | Payer: MEDICAID | Attending: Emergency Medicine | Admitting: Emergency Medicine

## 2024-01-27 ENCOUNTER — Other Ambulatory Visit: Payer: Self-pay

## 2024-01-27 ENCOUNTER — Encounter (HOSPITAL_COMMUNITY): Payer: Self-pay

## 2024-01-27 DIAGNOSIS — R002 Palpitations: Secondary | ICD-10-CM | POA: Diagnosis present

## 2024-01-27 DIAGNOSIS — Z5321 Procedure and treatment not carried out due to patient leaving prior to being seen by health care provider: Secondary | ICD-10-CM | POA: Diagnosis not present

## 2024-01-27 LAB — URINALYSIS, ROUTINE W REFLEX MICROSCOPIC
Bacteria, UA: NONE SEEN
Bilirubin Urine: NEGATIVE
Glucose, UA: NEGATIVE mg/dL
Hgb urine dipstick: NEGATIVE
Ketones, ur: NEGATIVE mg/dL
Leukocytes,Ua: NEGATIVE
Nitrite: NEGATIVE
Protein, ur: 30 mg/dL — AB
Specific Gravity, Urine: 1.025 (ref 1.005–1.030)
pH: 5 (ref 5.0–8.0)

## 2024-01-27 LAB — CBC WITH DIFFERENTIAL/PLATELET
Abs Immature Granulocytes: 0.01 K/uL (ref 0.00–0.07)
Basophils Absolute: 0 K/uL (ref 0.0–0.1)
Basophils Relative: 0 %
Eosinophils Absolute: 0.1 K/uL (ref 0.0–0.5)
Eosinophils Relative: 3 %
HCT: 41.4 % (ref 39.0–52.0)
Hemoglobin: 14 g/dL (ref 13.0–17.0)
Immature Granulocytes: 0 %
Lymphocytes Relative: 41 %
Lymphs Abs: 2.1 K/uL (ref 0.7–4.0)
MCH: 28.7 pg (ref 26.0–34.0)
MCHC: 33.8 g/dL (ref 30.0–36.0)
MCV: 85 fL (ref 80.0–100.0)
Monocytes Absolute: 0.4 K/uL (ref 0.1–1.0)
Monocytes Relative: 7 %
Neutro Abs: 2.5 K/uL (ref 1.7–7.7)
Neutrophils Relative %: 49 %
Platelets: 181 K/uL (ref 150–400)
RBC: 4.87 MIL/uL (ref 4.22–5.81)
RDW: 13.6 % (ref 11.5–15.5)
WBC: 5.1 K/uL (ref 4.0–10.5)
nRBC: 0 % (ref 0.0–0.2)

## 2024-01-27 LAB — RAPID URINE DRUG SCREEN, HOSP PERFORMED
Amphetamines: NOT DETECTED
Barbiturates: NOT DETECTED
Benzodiazepines: NOT DETECTED
Cocaine: NOT DETECTED
Opiates: NOT DETECTED
Tetrahydrocannabinol: POSITIVE — AB

## 2024-01-27 LAB — BASIC METABOLIC PANEL WITH GFR
Anion gap: 14 (ref 5–15)
BUN: 8 mg/dL (ref 6–20)
CO2: 23 mmol/L (ref 22–32)
Calcium: 10.1 mg/dL (ref 8.9–10.3)
Chloride: 99 mmol/L (ref 98–111)
Creatinine, Ser: 0.95 mg/dL (ref 0.61–1.24)
GFR, Estimated: >60 mL/min (ref >60)
Glucose, Bld: 99 mg/dL (ref 70–99)
Potassium: 4 mmol/L (ref 3.5–5.1)
Sodium: 136 mmol/L (ref 135–145)

## 2024-01-27 NOTE — ED Notes (Signed)
Pt called multiple times for vitals recheck, no response.

## 2024-01-27 NOTE — ED Triage Notes (Signed)
 Pt to er via ems, per ems pt was picked up [facility], states that pt is here for palpitations.  Pt states that he is here because he feels like his heart is racing. States that it started around 2pm, states that fresh air makes it better.  Pt denies pain.

## 2024-01-27 NOTE — ED Provider Triage Note (Signed)
 Emergency Medicine Provider Triage Evaluation Note  Jashad D Bordeaux , a 23 y.o. male  was evaluated in triage.  Pt complains of palpitations.  Review of Systems  Positive: Palpitations Negative: Chest pain  Physical Exam  BP (!) 147/78 (BP Location: Right Arm)   Pulse (!) 111   Temp 98.7 F (37.1 C) (Oral)   Resp 18   Ht 5' 9 (1.753 m)   Wt 72.6 kg   SpO2 99%   BMI 23.63 kg/m  Gen:   Awake, no distress   Resp:  Normal effort  MSK:   Moves extremities without difficulty  Other:  Appears to be under the influence  Medical Decision Making  Medically screening exam initiated at 6:01 PM.  Appropriate orders placed.  Ingvald D Dempster was informed that the remainder of the evaluation will be completed by another provider, this initial triage assessment does not replace that evaluation, and the importance of remaining in the ED until their evaluation is complete.  Labs ordered   Francis Ileana SAILOR, PA-C 01/27/24 [id number]

## 2024-01-29 ENCOUNTER — Emergency Department (HOSPITAL_COMMUNITY)
Admission: EM | Admit: 2024-01-29 | Discharge: 2024-01-30 | Disposition: A | Discharge status: Home / Self Care | Payer: MEDICAID | Attending: Emergency Medicine | Admitting: Emergency Medicine

## 2024-01-29 DIAGNOSIS — R0789 Other chest pain: Secondary | ICD-10-CM | POA: Diagnosis not present

## 2024-01-29 DIAGNOSIS — R079 Chest pain, unspecified: Secondary | ICD-10-CM | POA: Diagnosis present
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
## 2024-01-29 NOTE — ED Triage Notes (Signed)
 Pt arrived from home via [facility] c/o fast heart rate and chest pain. Pt states he took 324 mg asa and feels better now. Pt refusing care. Before this triage note was complete this writer had MD come talk to pt before pt walked out.

## 2024-01-29 NOTE — ED Notes (Signed)
 Pt refusing EKG, acting nervously and stating he wants to leave.
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
## 2024-01-29 NOTE — ED Provider Notes (Addendum)
 [place] EMERGENCY DEPARTMENT AT [facility] Provider Note   CSN: [id number] Arrival date & time: 01/29/24  2338     Patient presents with: Palpitations   Jeremy Taylor is a 23 y.o. male.   Patient came in by ambulance for complaints of chest pain.  He reports that he had an episode of pain that lasted about 5 minutes and it resolved.  He is a little bit lightheaded now but does not have any cardiac complaints.  No difficulty breathing.  Patient reports that he feels better and does not want to be seen       Prior to Admission medications   Medication Sig Start Date End Date Taking? Authorizing Provider  acetaminophen  (TYLENOL ) 500 MG tablet Take 500 mg by mouth every 6 (six) hours as needed for moderate pain or headache.    [provider]  risperiDONE  (RISPERDAL ) 2 MG tablet Take 1 tablet (2 mg total) by mouth daily. 12/18/23   Theotis Peers M, PA-C  traZODone  (DESYREL ) 50 MG tablet Take 1 tablet (50 mg total) by mouth at bedtime. 12/18/23   Theotis Peers M, PA-C  ARIPiprazole  (ABILIFY ) 15 MG tablet Take 1 tablet (15 mg total) by mouth at bedtime. For mood control Patient not taking: Reported on 04/01/2020 03/02/20 04/01/20  Collene Gouge I, NP  sertraline  (ZOLOFT ) 25 MG tablet Take 1 tablet (25 mg total) by mouth daily. For depression Patient not taking: Reported on 04/01/2020 03/03/20 04/01/20  Collene Gouge I, NP    Allergies: Patient has no known allergies.    Review of Systems  Updated Vital Signs BP (!) 139/91 (BP Location: Right Arm)   Pulse 64   Temp 98.2 F (36.8 C) (Oral)   Resp 18   SpO2 100%   Physical Exam Vitals and nursing note reviewed.  Constitutional:      General: He is not in acute distress.    Appearance: He is well-developed.  HENT:     Head: Normocephalic and atraumatic.     Mouth/Throat:     Mouth: Mucous membranes are moist.  Eyes:     General: Vision grossly intact. Gaze aligned appropriately.     Extraocular  Movements: Extraocular movements intact.     Conjunctiva/sclera: Conjunctivae normal.  Cardiovascular:     Rate and Rhythm: Normal rate and regular rhythm.     Pulses: Normal pulses.     Heart sounds: Normal heart sounds, S1 normal and S2 normal. No murmur heard.    No friction rub. No gallop.  Pulmonary:     Effort: Pulmonary effort is normal. No respiratory distress.     Breath sounds: Normal breath sounds.  Abdominal:     Palpations: Abdomen is soft.     Tenderness: There is no abdominal tenderness. There is no guarding or rebound.     Hernia: No hernia is present.  Musculoskeletal:        General: No swelling.     Cervical back: Full passive range of motion without pain, normal range of motion and neck supple. No pain with movement, spinous process tenderness or muscular tenderness. Normal range of motion.     Right lower leg: No edema.     Left lower leg: No edema.  Skin:    General: Skin is warm and dry.     Capillary Refill: Capillary refill takes less than 2 seconds.     Findings: No ecchymosis, erythema, lesion or wound.  Neurological:     Mental Status: He  is alert and oriented to person, place, and time.     GCS: GCS eye subscore is 4. GCS verbal subscore is 5. GCS motor subscore is 6.     Cranial Nerves: Cranial nerves 2-12 are intact.     Sensory: Sensation is intact.     Motor: Motor function is intact. No weakness or abnormal muscle tone.     Coordination: Coordination is intact.  Psychiatric:        Mood and Affect: Mood normal.        Speech: Speech normal.        Behavior: Behavior normal.     (all labs ordered are listed, but only abnormal results are displayed) Labs Reviewed - No data to display  EKG: None  Radiology: No results found.   Procedures   Medications Ordered in the ED - No data to display                                  Medical Decision Making  Presents with chest pain originally.  Patient reports that symptoms resolved prior to  coming to the emergency department.  He does not wish to be seen any longer.  I did evaluate him in EMS triage.  His examination is unremarkable.  Vital signs are unremarkable.  Patient declines any workup.  Symptoms seem very atypical for cardiac etiology and as he has no current complaints, will discharge.  Patient reports that he will call somebody to come pick him up.     Final diagnoses:  Atypical chest pain    ED Discharge Orders     None          Bunyan Brier, Lonni PARAS, MD 01/29/24 2351    Haze Lonni PARAS, MD 01/29/24 2351
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
## 2024-03-08 ENCOUNTER — Other Ambulatory Visit: Payer: Self-pay

## 2024-03-08 ENCOUNTER — Emergency Department (HOSPITAL_COMMUNITY)
Admission: EM | Admit: 2024-03-08 | Discharge: 2024-03-09 | Disposition: A | Discharge status: Other Acute Inpatient Hospital | Payer: MEDICAID | Attending: Emergency Medicine | Admitting: Emergency Medicine

## 2024-03-08 DIAGNOSIS — R7309 Other abnormal glucose: Secondary | ICD-10-CM | POA: Diagnosis not present

## 2024-03-08 DIAGNOSIS — F200 Paranoid schizophrenia: Secondary | ICD-10-CM | POA: Diagnosis not present

## 2024-03-08 DIAGNOSIS — R443 Hallucinations, unspecified: Secondary | ICD-10-CM | POA: Diagnosis present

## 2024-03-08 DIAGNOSIS — F1220 Cannabis dependence, uncomplicated: Secondary | ICD-10-CM

## 2024-03-08 LAB — CBC WITH DIFFERENTIAL/PLATELET
Abs Immature Granulocytes: 0.01 K/uL (ref 0.00–0.07)
Basophils Absolute: 0 K/uL (ref 0.0–0.1)
Basophils Relative: 1 %
Eosinophils Absolute: 0.1 K/uL (ref 0.0–0.5)
Eosinophils Relative: 2 %
HCT: 43.8 % (ref 39.0–52.0)
Hemoglobin: 14.6 g/dL (ref 13.0–17.0)
Immature Granulocytes: 0 %
Lymphocytes Relative: 50 %
Lymphs Abs: 3 K/uL (ref 0.7–4.0)
MCH: 28.7 pg (ref 26.0–34.0)
MCHC: 33.3 g/dL (ref 30.0–36.0)
MCV: 86.1 fL (ref 80.0–100.0)
Monocytes Absolute: 0.4 K/uL (ref 0.1–1.0)
Monocytes Relative: 7 %
Neutro Abs: 2.5 K/uL (ref 1.7–7.7)
Neutrophils Relative %: 40 %
Platelets: 161 K/uL (ref 150–400)
RBC: 5.09 MIL/uL (ref 4.22–5.81)
RDW: 13.3 % (ref 11.5–15.5)
WBC: 6.1 K/uL (ref 4.0–10.5)
nRBC: 0 % (ref 0.0–0.2)

## 2024-03-08 LAB — COMPREHENSIVE METABOLIC PANEL WITH GFR
ALT: 19 U/L (ref 0–44)
AST: 20 U/L (ref 15–41)
Albumin: 5.2 g/dL — ABNORMAL HIGH (ref 3.5–5.0)
Alkaline Phosphatase: 114 U/L (ref 38–126)
Anion gap: 13 (ref 5–15)
BUN: 12 mg/dL (ref 6–20)
CO2: 23 mmol/L (ref 22–32)
Calcium: 9.8 mg/dL (ref 8.9–10.3)
Chloride: 100 mmol/L (ref 98–111)
Creatinine, Ser: 0.89 mg/dL (ref 0.61–1.24)
GFR, Estimated: >60 mL/min
Glucose, Bld: 93 mg/dL (ref 70–99)
Potassium: 4.3 mmol/L (ref 3.5–5.1)
Sodium: 136 mmol/L (ref 135–145)
Total Bilirubin: 0.6 mg/dL (ref 0.0–1.2)
Total Protein: 7.8 g/dL (ref 6.5–8.1)

## 2024-03-08 LAB — TROPONIN T, HIGH SENSITIVITY: Troponin T High Sensitivity: <15 ng/L (ref 0–19)

## 2024-03-08 LAB — ETHANOL: Alcohol, Ethyl (B): <15 mg/dL

## 2024-03-08 LAB — CBG MONITORING, ED: Glucose-Capillary: 156 mg/dL — ABNORMAL HIGH (ref 70–99)

## 2024-03-08 LAB — ACETAMINOPHEN LEVEL: Acetaminophen (Tylenol), Serum: <10 ug/mL — ABNORMAL LOW (ref 10–30)

## 2024-03-08 LAB — SALICYLATE LEVEL: Salicylate Lvl: <7 mg/dL — ABNORMAL LOW (ref 7.0–30.0)

## 2024-03-08 MED FILL — Risperidone Tab 1 MG: 2.0000 mg | ORAL | Qty: 2 | Status: AC

## 2024-03-08 NOTE — ED Notes (Signed)
 IVC PAPERWORK ATTACHED AT THE CLIPBOARD IN PURPLE ZONE

## 2024-03-08 NOTE — ED Notes (Signed)
 PT Items in [employer] #5

## 2024-03-08 NOTE — ED Notes (Signed)
 Pt exited treatment room, looking at staff & common area. Pt asked if he needed to use the restroom & he just stared at pt safety attendant. Pt instructed need to return to room. Pt stated he was trying to leave facility. Security to area & pt returned to treatment room.

## 2024-03-08 NOTE — ED Provider Triage Note (Signed)
 Emergency Medicine Provider Triage Evaluation Note  Jeremy Taylor , a 23 y.o. male  was evaluated in triage.  Pt complains of hallucinations.  Patient appears to be responding to internal stimuli.  Smells very heavily of marijuana but denies any drug use.  Review of Systems  Positive:  Negative:   Physical Exam  BP (!) 160/105 (BP Location: Right Arm)   Pulse 81   Temp 97.8 F (36.6 C)   Resp 20   SpO2 100%  Gen:   Awake, no distress   Resp:  Normal effort  MSK:   Moves extremities without difficulty  Other:    Medical Decision Making  Medically screening exam initiated at 6:51 PM.  Appropriate orders placed.  Jeremy Taylor was informed that the remainder of the evaluation will be completed by another provider, this initial triage assessment does not replace that evaluation, and the importance of remaining in the ED until their evaluation is complete.  Patient initially came in as a Jeremy Taylor however was able to give me his name but would not spell it.  Was able given his date of birth.  There is an picture from previous chart that does appear to be the patient.  I do feel the patient is responding to internal stimuli and does have some thought blocking as well.  I filled an IVC paperwork.  Medical clearance ordered as well.   Jeremy Taylor, [place] 03/08/24 [phone]

## 2024-03-08 NOTE — ED Notes (Signed)
 Security at bedside, pt wanded, shoes placed in belongings locker #5 with belongings bag that was placed at time of pt's arrival from triage.

## 2024-03-08 NOTE — ED Triage Notes (Addendum)
 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

## 2024-03-08 NOTE — ED Notes (Addendum)
 Pt told safety attendant he needed to see the nurse. When this RN responded pt just sat & stared at me. This RN asked if pt was in pain, needed the restroom, wanted something to drink. Pt only responded no. Pt sitting on side of bed.  Asked pt to provide urine specimen, pt made no attempt at communication.

## 2024-03-08 NOTE — ED Notes (Signed)
 Pt asked for urine specimen. Pt just stares at staff not moving.

## 2024-03-08 NOTE — ED Notes (Signed)
IVC IN PROCESS

## 2024-03-08 NOTE — ED Notes (Signed)
 Attempted to call lab to add on troponin order, no answer x 2. Will attempt again.

## 2024-03-08 NOTE — ED Notes (Signed)
 IVCPAPERWORK COMMPLETED COPIES MADE ORIGINAL IN THE RED FOLDER FINDING AND CUSTODY UPLOAD IN THE CHART AND FINDINGS AND CUSTODY SENT TO THE COURT OF CLERK. CASE # [id number]

## 2024-03-08 NOTE — ED Provider Notes (Signed)
 " [place] EMERGENCY DEPARTMENT AT [facility] Provider Note   CSN: [id number] Arrival date & time: 03/08/24  1615     Patient presents with: Memory Loss, Psychiatric Evaluation, and Hallucinations   Jeremy Taylor is a 23 y.o. male initially presented to the ER by himself as a [name].  Triage note mentions patient could not say his name however when I evaluated patient he was able to give me his name and date of birth.  He does appear to be responding to internal stimuli.  When looking up his previous health record, does appear he has a history of schizophrenia and paranoia as well as delusional disorder.  He has been seen before for these issues as well as for hallucinations.  He reports visual and auditory hallucinations to me.  Reports that he can hear and see the voices.  He does display some thought blocking and does appear to be responding to internal stimuli.  IVC paperwork completed.  Given patient's hallucinations and psych history, level 5 caveat.  He currently has no complaints.  Denies any drug use but patient smells very heavily of marijuana. HPI     Prior to Admission medications  Not on File    Allergies: Patient has no known allergies.    Review of Systems  Unable to perform ROS: Psychiatric disorder    Updated Vital Signs BP (!) 160/105 (BP Location: Right Arm)   Pulse 81   Temp 97.8 F (36.6 C)   Resp 20   SpO2 100%   Physical Exam Vitals and nursing note reviewed.  Constitutional:      General: He is not in acute distress.    Appearance: He is not ill-appearing or toxic-appearing.  Eyes:     General: No scleral icterus. Cardiovascular:     Rate and Rhythm: Normal rate.  Pulmonary:     Effort: Pulmonary effort is normal. No respiratory distress.  Abdominal:     Palpations: Abdomen is soft.     Tenderness: There is no abdominal tenderness.  Skin:    General: Skin is warm and dry.  Neurological:     Mental Status: He is alert and  oriented to person, place, and time.     GCS: GCS eye subscore is 4. GCS verbal subscore is 5. GCS motor subscore is 6.     Cranial Nerves: No dysarthria or facial asymmetry.     Motor: No weakness.  Psychiatric:        Attention and Perception: He is inattentive. He perceives auditory and visual hallucinations.        Thought Content: Thought content does not include homicidal or suicidal ideation. Thought content does not include homicidal or suicidal plan.     Comments: Does appear to be responding to stimuli.  Reports visual and auditory hallucinations.  Denies suicidal or homicidal ideations.  He is alert and oriented x 3.  Flat affect, odd behavior.     (all labs ordered are listed, but only abnormal results are displayed) Labs Reviewed  CBG MONITORING, ED - Abnormal; Notable for the following components:      Result Value   Glucose-Capillary 156 (*)    All other components within normal limits  CBC WITH DIFFERENTIAL/PLATELET  COMPREHENSIVE METABOLIC PANEL WITH GFR  ETHANOL  URINE DRUG SCREEN  SALICYLATE LEVEL  ACETAMINOPHEN  LEVEL  URINALYSIS, ROUTINE W REFLEX MICROSCOPIC    EKG: None  Radiology: No results found.  Procedures   Medications Ordered in the ED -  No data to display  Medical Decision Making Amount and/or Complexity of Data Reviewed Labs: ordered.   23 y.o. male presents to the ER for evaluation of psych. Differential diagnosis includes but is not limited to substance use, electrolyte derangement, psych. Vital signs show blood pressure otherwise unremarkable. Physical exam as noted above.   On previous chart evaluation, patient history of paranoid schizophrenia, delusional sorter, altered mental status, and polysubstance use.  Was seen in October for refill of his Risperdal .  Also seen visit in July of this year as well.  Will order medical clearance lab work.  Patient currently has no complaints he does not appear in any acute distress.  IVC paperwork  completed.  I independently reviewed and interpreted the patient's labs.  Salicylate, acetaminophen , and ethanol levels undetectable.  CBC without leukocytosis or anemia.  CMP shows an albumin  of 5.2 otherwise no electrolyte or LFT abnormality.  EKG reviewed by my attending. Does have some elevated in the inferior leads. Confirmed with patient that he is not having any chest pain. Will add on troponin. If normal, can be medically clear.  Troponin undetectable. Please see attending attestation for EKG read.   Patient has still not provided urine sample. This does not change my decision for medical clearance. The patient is medically clear for TTS evaluation.  Daily Risperdal  dose ordered.  Portions of this report may have been transcribed using voice recognition software. Every effort was made to ensure accuracy; however, inadvertent computerized transcription errors may be present.    Final diagnoses:  Hallucinations    ED Discharge Orders     None          Bernis Ernst, [place] 03/08/24 2208    Elnor Bernarda SQUIBB, DO 03/11/24 [phone]  "

## 2024-03-08 NOTE — ED Triage Notes (Addendum)
 Quick triage note: limited assessment d/t pt not forthcoming with information, reports he was told to come to the ED, not forthcoming with any other information, unable to tell this RN his name or DOB, every question asked, response is silence or I dont remember Ambulatory in triage, NAD, appears to be paranoid, heavy smell of marijuana .

## 2024-03-09 ENCOUNTER — Other Ambulatory Visit: Payer: Self-pay

## 2024-03-09 ENCOUNTER — Encounter (HOSPITAL_COMMUNITY): Payer: Self-pay | Admitting: Psychiatry

## 2024-03-09 ENCOUNTER — Inpatient Hospital Stay (HOSPITAL_COMMUNITY)
Admission: AD | Admit: 2024-03-09 | Discharge: 2024-03-21 | DRG: 885 | Disposition: A | Discharge status: Home / Self Care | Payer: MEDICAID | Source: Intra-hospital

## 2024-03-09 DIAGNOSIS — F200 Paranoid schizophrenia: Secondary | ICD-10-CM | POA: Diagnosis present

## 2024-03-09 DIAGNOSIS — G4700 Insomnia, unspecified: Secondary | ICD-10-CM | POA: Diagnosis present

## 2024-03-09 DIAGNOSIS — E559 Vitamin D deficiency, unspecified: Secondary | ICD-10-CM | POA: Diagnosis present

## 2024-03-09 DIAGNOSIS — F1220 Cannabis dependence, uncomplicated: Secondary | ICD-10-CM | POA: Diagnosis present

## 2024-03-09 DIAGNOSIS — F940 Selective mutism: Secondary | ICD-10-CM | POA: Diagnosis present

## 2024-03-09 DIAGNOSIS — Z91148 Patient's other noncompliance with medication regimen for other reason: Secondary | ICD-10-CM

## 2024-03-09 DIAGNOSIS — Z818 Family history of other mental and behavioral disorders: Secondary | ICD-10-CM | POA: Diagnosis not present

## 2024-03-09 DIAGNOSIS — K219 Gastro-esophageal reflux disease without esophagitis: Secondary | ICD-10-CM | POA: Diagnosis present

## 2024-03-09 DIAGNOSIS — K5900 Constipation, unspecified: Secondary | ICD-10-CM | POA: Diagnosis present

## 2024-03-09 DIAGNOSIS — Z79899 Other long term (current) drug therapy: Secondary | ICD-10-CM | POA: Diagnosis not present

## 2024-03-09 LAB — URINE DRUG SCREEN
Amphetamines: NEGATIVE
Barbiturates: NEGATIVE
Benzodiazepines: NEGATIVE
Cocaine: NEGATIVE
Fentanyl: NEGATIVE
Methadone Scn, Ur: NEGATIVE
Opiates: NEGATIVE
Tetrahydrocannabinol: POSITIVE — AB

## 2024-03-09 LAB — URINALYSIS, ROUTINE W REFLEX MICROSCOPIC
Bacteria, UA: NONE SEEN
Bilirubin Urine: NEGATIVE
Glucose, UA: NEGATIVE mg/dL
Hgb urine dipstick: NEGATIVE
Ketones, ur: 20 mg/dL — AB
Leukocytes,Ua: NEGATIVE
Nitrite: NEGATIVE
Protein, ur: 30 mg/dL — AB
Specific Gravity, Urine: 1.035 — ABNORMAL HIGH (ref 1.005–1.030)
pH: 5 (ref 5.0–8.0)

## 2024-03-09 MED ORDER — DIPHENHYDRAMINE HCL 50 MG/ML IJ SOLN: 50.0000 mg | Freq: Three times a day (TID) | INTRAMUSCULAR | Status: DC | PRN

## 2024-03-09 MED ORDER — ALUM & MAG HYDROXIDE-SIMETH 200-200-20 MG/5ML PO SUSP: 30.0000 mL | ORAL | Status: DC | PRN

## 2024-03-09 MED ORDER — HALOPERIDOL LACTATE 5 MG/ML IJ SOLN: 10.0000 mg | Freq: Three times a day (TID) | INTRAMUSCULAR | Status: DC | PRN

## 2024-03-09 MED ORDER — LORAZEPAM 2 MG/ML IJ SOLN: 2.0000 mg | Freq: Three times a day (TID) | INTRAMUSCULAR | Status: DC | PRN

## 2024-03-09 MED ORDER — HALOPERIDOL 5 MG PO TABS: 5.0000 mg | ORAL_TABLET | Freq: Three times a day (TID) | ORAL | Status: DC | PRN

## 2024-03-09 MED ORDER — HALOPERIDOL LACTATE 5 MG/ML IJ SOLN: 5.0000 mg | Freq: Three times a day (TID) | INTRAMUSCULAR | Status: DC | PRN

## 2024-03-09 MED ORDER — DIPHENHYDRAMINE HCL 25 MG PO CAPS: 50.0000 mg | ORAL_CAPSULE | Freq: Three times a day (TID) | ORAL | Status: DC | PRN

## 2024-03-09 MED ORDER — MAGNESIUM HYDROXIDE 400 MG/5ML PO SUSP
30.0000 mL | Freq: Every day | ORAL | Status: DC | PRN
  Administered 2024-03-09 – 2024-03-15 (×3): 30 mL via ORAL
  Filled 2024-03-09 (×3): qty 30

## 2024-03-09 MED ORDER — ACETAMINOPHEN 325 MG PO TABS
650.0000 mg | ORAL_TABLET | Freq: Four times a day (QID) | ORAL | Status: DC | PRN
  Administered 2024-03-12 – 2024-03-20 (×5): 650 mg via ORAL
  Filled 2024-03-09 (×2): qty 2

## 2024-03-09 MED ORDER — NICOTINE 21 MG/24HR TD PT24
21.0000 mg | MEDICATED_PATCH | Freq: Every day | TRANSDERMAL | Status: DC
  Administered 2024-03-10 – 2024-03-21 (×10): 21 mg via TRANSDERMAL
  Filled 2024-03-09 (×7): qty 1

## 2024-03-09 MED ADMIN — Risperidone Tab 1 MG: 2 mg | ORAL | NDC 43547034106

## 2024-03-09 MED FILL — Risperidone Orally Disintegrating Tab 1 MG: 2.0000 mg | ORAL | Qty: 2 | Status: AC

## 2024-03-09 MED FILL — Risperidone Tab 2 MG: 2.0000 mg | ORAL | Qty: 1 | Status: AC

## 2024-03-09 NOTE — Consult Note (Signed)
 [facility] Consult Note  Patient Name: Jeremy Taylor MRN: [id number] DOB: 15-Feb-2001 DATE OF Consult: 03/09/2024 Consult Order details:  Orders (From admission, onward)     Start     Ordered   03/08/24 2209  CONSULT TO CALL ACT TEAM       Ordering Provider: Bernis Ernst, PA-C  Provider:  (Not yet assigned)  Question:  Reason for Consult?  Answer:  Psych consult   03/08/24 2208   03/08/24 2209  IP CONSULT TO PSYCHIATRY       Ordering Provider: Bernis Ernst, PA-C  Provider:  (Not yet assigned)  Question:  Reason for consult:  Answer:  Medication management   03/08/24 2208            PRIMARY PSYCHIATRIC DIAGNOSES   1.  Paranoid Schizophrenia 2.  Cannabis Use Disorder, Serious with Dependence   RECOMMENDATIONS  Recommendations: Medication recommendations: Give Risperdal  M-Tab 2 mg SL now for psychosis.  Then begin Risperdal  2 mg po every day for psychosis.  OK to give another 2 mg dose later today, given clinical condition.  PRN's:  hydroxyzine  50 mg po q6h PRN anxiety; Risperdal  M-Tab, 1 mg SL q4h PRN agitation; and for severe agitation/aggression, Geodon , 10 mg IM q6h PRN and Valium  10 mg IM q6h PRN.   Non-Medication/therapeutic recommendations: Given level of psychosis, patient will continue to need close observation until he can safely be admitted to Psychiatry.  Continue with matter-of fact emotional support in ED, pending transfer. Is inpatient psychiatric hospitalization recommended for this patient? Yes (Explain why): Patient appears to be complete absorbed in responding to internal stimuli, and he lacks the ability to complete even basic ADL's as a result.  Meet IVC criteria for grave disability, and admit patient for emergency treatment. Is another care setting recommended for this patient? (examples may include Crisis Stabilization Unit, Residential/Recovery Treatment, ALF/SNF, Memory Care Unit)  No (Explain why): No, as above From a psychiatric perspective,  is this patient appropriate for discharge to an outpatient setting/resource or other less restrictive environment for continued care?  No (Explain why): No, as above Follow-Up Telepsychiatry C/L services: We will sign off for now. Please re-consult our service if needed for any concerning changes in the patient's condition, discharge planning, or questions. Communication: Treatment team members (and family members if applicable) who were involved in treatment/care discussions and planning, and with whom we spoke or engaged with via secure text/chat, include the following: Secure message sent to Dr. Lorette, ED attending, and staff, outlining above recommendations.  I personally spent a total of 30 minutes in the care of the patient today including preparing to see the patient, getting/reviewing separately obtained history, performing a medically appropriate exam/evaluation, counseling and educating, placing orders, referring and communicating with other health care professionals, documenting clinical information in the EHR, and independently interpreting results.  Thank you for involving us  in the care of this patient. If you have any additional questions or concerns, please call [phone] and ask for provider on-call.   TELEPSYCHIATRY ATTESTATION & CONSENT   As the provider for this telehealth consult, I attest that I verified the patients identity using two separate identifiers, introduced myself to the patient, provided my credentials, disclosed my location, and performed this encounter via a HIPAA-compliant, real-time, face-to-face, two-way, interactive audio and video platform and with the full consent and agreement of the patient (or guardian as applicable.)   Patient physical location: ED, [facility]. Telehealth provider physical location: home office in state of  [place] .  Video start time: 0500h EST  Video end time: 0515h EST    IDENTIFYING DATA  Jeremy Taylor is a 23 y.o.  year-old male for whom a psychiatric consultation has been ordered by the primary provider. The patient was identified using two separate identifiers.  CHIEF COMPLAINT/REASON FOR CONSULT   I hear voices.   HISTORY OF PRESENT ILLNESS (HPI)  The patient presents with longstanding history of paranoid schizophrenia, which has chronically has been comorbid with drug usage, especially cannabis and synthetic cannabinoids.  Patient brought to ED (apparently by authorities) and has been unable to give sustained, coherent responses since his arrival.  At first, could not even say his name, but now can do that, give his birth date, but otherwise he appears completely absorbed by responding to internal stimuli.  When asked what the voices (which he acknowledges) are saying, he simply stares.  Does admit that he has not been taking medication.  Says that he lives alone.  Otherwise simply stares.  UDS not yet collected.  BAL negative.  No reports of homicidal statements.   PAST PSYCHIATRIC HISTORY  Patient has history of treatment for schizophrenia, with past admits for marked disorganization of thought.  Otherwise as per HPI above.  PAST MEDICAL HISTORY  No past medical history on file. See ED Provider's H&P  HOME MEDICATIONS  Facility Ordered Medications  Medication   risperiDONE  (RISPERDAL ) tablet 2 mg   risperiDONE  (RISPERDAL  M-TABS) disintegrating tablet 2 mg   hydrOXYzine  (ATARAX ) tablet 50 mg   risperiDONE  (RISPERDAL  M-TABS) disintegrating tablet 1 mg   ziprasidone  (GEODON ) injection 10 mg   diazepam  (VALIUM ) injection 10 mg   PTA Medications  Medication Sig   sertraline  (ZOLOFT ) 25 MG tablet Take 25 mg by mouth daily. (Patient not taking: Reported on 03/09/2024)   traZODone  (DESYREL ) 50 MG tablet Take 50 mg by mouth at bedtime. (Patient not taking: Reported on 03/09/2024)   risperiDONE  (RISPERDAL ) 2 MG tablet Take 2 mg by mouth at bedtime. (Patient not taking: Reported on 03/09/2024)      ALLERGIES  Allergies[1]  SOCIAL & SUBSTANCE USE HISTORY  Social History   Socioeconomic History   Marital status: Single    Spouse name: Not on file   Number of children: Not on file   Years of education: Not on file   Highest education level: Not on file  Occupational History   Not on file  Tobacco Use   Smoking status: Not on file   Smokeless tobacco: Not on file  Substance and Sexual Activity   Alcohol use: Not on file   Drug use: Not on file   Sexual activity: Not on file  Other Topics Concern   Not on file  Social History Narrative   Not on file   Social Drivers of Health   Tobacco Use: Not on file  Financial Resource Strain: Not on file  Food Insecurity: Not on file  Transportation Needs: Not on file  Physical Activity: Not on file  Stress: Not on file  Social Connections: Not on file  Depression (EYV7-0): Not on file  Alcohol Screen: Not on file  Housing: Not on file  Utilities: Not on file  Health Literacy: Not on file   Tobacco Use History[2] Social History   Substance and Sexual Activity  Alcohol Use Not on file   Social History   Substance and Sexual Activity  Drug Use Not on file    Additional pertinent information Patient unable to give even  the most basic of symptoms or person information, given his absorption with the voices that he hears. SABRA  FAMILY HISTORY  No family history on file. Family Psychiatric History (if known):  Did not report  MENTAL STATUS EXAM (MSE)  Mental Status Exam: General Appearance: Fairly Groomed  Orientation:  Patient unable to give even the most basic of symptoms or person information  Memory:  Patient unable to give even the most basic of symptoms or person information  Concentration:  Concentration: Poor and Attention Span: Poor  Recall:  Patient unable to give even the most basic of symptoms or person information  Attention  Poor  Eye Contact:  Minimal  Speech:  Blocked and Slow  Language:  Fair   Volume:  Decreased  Mood: Patient unable to give even the most basic of symptoms or person information  Affect:  Blunt, Flat, and Inappropriate  Thought Process:  Disorganized and Descriptions of Associations: Loose  Thought Content:  Delusions, Hallucinations: Auditory, and Rumination  Suicidal Thoughts:  Patient unable to give even the most basic of symptoms or person information  Homicidal Thoughts:  Patient unable to give even the most basic of symptoms or person information  Judgement:  Poor  Insight:  Lacking  Psychomotor Activity:  Psychomotor Retardation  Akathisia:  No  Fund of Knowledge:  Patient unable to give even the most basic of symptoms or person information    Assets:  Others:  Patient unable to give even the most basic of symptoms or person information  Cognition:  Impaired,  Mild  ADL's:  Impaired  AIMS (if indicated):       VITALS  Blood pressure (!) 160/105, pulse 81, temperature 97.8 F (36.6 C), resp. rate 20, SpO2 100%.  LABS  Admission on 03/08/2024  Component Date Value Ref Range Status   Glucose-Capillary 03/08/2024 156 (H)  70 - 99 mg/dL Final   Glucose reference range applies only to samples taken after fasting for at least 8 hours.   Sodium 03/08/2024 136  135 - 145 mmol/L Final   Potassium 03/08/2024 4.3  3.5 - 5.1 mmol/L Final   Chloride 03/08/2024 100  98 - 111 mmol/L Final   CO2 03/08/2024 23  22 - 32 mmol/L Final   Glucose, Bld 03/08/2024 93  70 - 99 mg/dL Final   Glucose reference range applies only to samples taken after fasting for at least 8 hours.   BUN 03/08/2024 12  6 - 20 mg/dL Final   Creatinine, Ser 03/08/2024 0.89  0.61 - 1.24 mg/dL Final   Calcium 87/69/7974 9.8  8.9 - 10.3 mg/dL Final   Total Protein 87/69/7974 7.8  6.5 - 8.1 g/dL Final   Albumin 87/69/7974 5.2 (H)  3.5 - 5.0 g/dL Final   AST 87/69/7974 20  15 - 41 U/L Final   ALT 03/08/2024 19  0 - 44 U/L Final   Alkaline Phosphatase 03/08/2024 114  38 - 126 U/L Final   Total  Bilirubin 03/08/2024 0.6  0.0 - 1.2 mg/dL Final   GFR, Estimated 03/08/2024 >60  >60 mL/min Final   Comment: (NOTE) Calculated using the CKD-EPI Creatinine Equation (2021)    Anion gap 03/08/2024 13  5 - 15 Final   Performed at [facility], [address] [place], [place] [address]   Alcohol, Ethyl (B) 03/08/2024 <15  <15 mg/dL Final   Comment: (NOTE) For medical purposes only. Performed at [facility], [address], [place], [place] [address]  WBC 03/08/2024 6.1  4.0 - 10.5 K/uL Final   RBC 03/08/2024 5.09  4.22 - 5.81 MIL/uL Final   Hemoglobin 03/08/2024 14.6  13.0 - 17.0 g/dL Final   HCT 87/69/7974 43.8  39.0 - 52.0 % Final   MCV 03/08/2024 86.1  80.0 - 100.0 fL Final   MCH 03/08/2024 28.7  26.0 - 34.0 pg Final   MCHC 03/08/2024 33.3  30.0 - 36.0 g/dL Final   RDW 87/69/7974 13.3  11.5 - 15.5 % Final   Platelets 03/08/2024 161  150 - 400 K/uL Final   nRBC 03/08/2024 0.0  0.0 - 0.2 % Final   Neutrophils Relative % 03/08/2024 40  % Final   Neutro Abs 03/08/2024 2.5  1.7 - 7.7 K/uL Final   Lymphocytes Relative 03/08/2024 50  % Final   Lymphs Abs 03/08/2024 3.0  0.7 - 4.0 K/uL Final   Monocytes Relative 03/08/2024 7  % Final   Monocytes Absolute 03/08/2024 0.4  0.1 - 1.0 K/uL Final   Eosinophils Relative 03/08/2024 2  % Final   Eosinophils Absolute 03/08/2024 0.1  0.0 - 0.5 K/uL Final   Basophils Relative 03/08/2024 1  % Final   Basophils Absolute 03/08/2024 0.0  0.0 - 0.1 K/uL Final   Immature Granulocytes 03/08/2024 0  % Final   Abs Immature Granulocytes 03/08/2024 0.01  0.00 - 0.07 K/uL Final   Performed at [facility], [address] [place], [place] [address]   Salicylate Lvl 03/08/2024 <7.0 (L)  7.0 - 30.0 mg/dL Final   Performed at [facility], [address] [place], [place] [address]   Acetaminophen  (Tylenol ), Serum 03/08/2024 <10 (L)  10 - 30 ug/mL Final   Comment: (NOTE) Toxic concentrations can be more effectively related to post  dose interval; >200, >100, and >50 ug/mL serum concentrations correspond to toxic concentrations at 4, 8, and 12 hours post dose, respectively.  Performed at [facility], [address], [place], [place] [address]    Troponin T High Sensitivity 03/08/2024 <15  0 - 19 ng/L Final   Comment: (NOTE) Biotin concentrations > 1000 ng/mL falsely decrease TnT results.  Serial cardiac troponin measurements are suggested.  Refer to the Links section for chest pain algorithms and additional  guidance. Performed at [facility], [address], [place], [place] [address]     PSYCHIATRIC REVIEW OF SYSTEMS (ROS)  ROS: Notable for the following relevant positive findings: Review of Systems  Constitutional: Negative.   HENT: Negative.    Eyes: Negative.   Respiratory: Negative.    Cardiovascular: Negative.   Gastrointestinal: Negative.   Musculoskeletal: Negative.   Skin: Negative.   Neurological: Negative.   Endo/Heme/Allergies: Negative.   Psychiatric/Behavioral:  Positive for depression, hallucinations and substance abuse. The patient is nervous/anxious.     Additional findings:      Musculoskeletal: No abnormal movements observed      Gait & Station: Laying/Sitting      Pain Screening: Denies      Nutrition & Dental Concerns:  Reviewed   RISK FORMULATION/ASSESSMENT  Is the patient experiencing any suicidal or homicidal ideations: Unclear       Explain if yes:   Patient is simply staring out, but does say that he hears voices, but cannot say what they are like.  Cannot respond to routine quesitoning or do basic tasks for himself without much support.  Protective factors considered for safety management:   Patient unable to communicate coherently  or use social resources, given level of disorganization  Risk factors/concerns considered for safety management:  Isolation Unwillingness to seek help Male gender Unmarried Patient unable to give even basic  details  Is there a safety management plan with the patient and treatment team to minimize risk factors and promote protective factors: No           Explain: Patient unable to respond coherently to verbal input  Is crisis care placement or psychiatric hospitalization recommended: Yes     Based on my current evaluation and risk assessment, patient is determined at this time to be at:  High risk  *RISK ASSESSMENT Risk assessment is a dynamic process; it is possible that this patient's condition, and risk level, may change. This should be re-evaluated and managed over time as appropriate. Please re-consult psychiatric consult services if additional assistance is needed in terms of risk assessment and management. If your team decides to discharge this patient, please advise the patient how to best access emergency psychiatric services, or to call 911, if their condition worsens or they feel unsafe in any way.   Adriana JINNY Pontes, MD Telepsychiatry Consult Services    [1] No Known Allergies [2]  Social History Tobacco Use  Smoking Status Not on file  Smokeless Tobacco Not on file

## 2024-03-09 NOTE — ED Notes (Signed)
 Pt A&Ox4. Reports he came in for hearing voices. Pt denies SI/HI/AVH at this time. Pt calm and cooperative.

## 2024-03-09 NOTE — Progress Notes (Signed)
" °   03/09/24 1600  Psych Admission Type (Psych Patients Only)  Admission Status Involuntary  Psychosocial Assessment  Patient Complaints Anxiety  Eye Contact Brief  Facial Expression Blank  Affect Anxious  Speech Slow;Soft  Interaction Guarded  Motor Activity Slow  Appearance/Hygiene Unremarkable  Behavior Characteristics Cooperative  Mood Apprehensive  Thought Process  Coherency WDL  Content WDL  Delusions None reported or observed  Perception WDL  Hallucination Auditory  Judgment Impaired  Confusion Mild  Danger to Self  Current suicidal ideation? Denies  Danger to Others  Danger to Others None reported or observed    "

## 2024-03-09 NOTE — ED Notes (Signed)
 Pt participating in TTS interview at this time.

## 2024-03-09 NOTE — Group Note (Signed)
 Date:  03/09/2024 Time:  4:09 PM  Group Topic/Focus: Pharmacy Group Diagnosis Education:   The focus of this group is to discuss the major disorders that patients maybe diagnosed with.  Group discusses the importance of knowing what one's diagnosis is so that one can understand treatment and better advocate for oneself.    Participation Level:  Did Not Attend  Jeremy Taylor 03/09/2024, 4:09 PM

## 2024-03-09 NOTE — ED Notes (Signed)
Called [employer] for transport to [facility]

## 2024-03-09 NOTE — [facility] Group Notes (Signed)
 Jeremy Taylor did not attend wrap up group

## 2024-03-09 NOTE — Progress Notes (Signed)
 Pt has been accepted to [facility] on 03/09/2024 . Bed assignment:400-1.   Pt meets inpatient criteria per Adriana Pontes, MD   Attending Physician will be Dr. Raliegh    Report can be called to: - Adult unit: [phone]  Pt can arrive asap  Care Team Notified: [facility] Cherylynn Ernst, RN, Damien Fireman, RN, Jerel Gravely, NP

## 2024-03-09 NOTE — Plan of Care (Signed)
   Problem: Education: Goal: Knowledge of [place] [facility] Education information/materials will improve Outcome: Progressing Goal: Emotional status will improve Outcome: Progressing Goal: Mental status will improve Outcome: Progressing

## 2024-03-09 NOTE — ED Provider Notes (Signed)
 Emergency Medicine Observation Re-evaluation Note  Ab D Mcisaac is a 23 y.o. male, seen on rounds today.  Pt initially presented to the ED for complaints of schizophrenia, non compliance w outpatient meds, THC use, and acute psychosis. No new c/o this AM..  Physical Exam  BP (!) 151/103 (BP Location: Right Wrist)   Pulse (!) 55   Temp 98 F (36.7 C) (Oral)   Resp 18   SpO2 100%  Physical Exam General: calm, resting.  Cardiac: regular rate.  Lungs: breathing comfortably. Psych: calm, resting.   ED Course / MDM   I have reviewed the labs performed to date as well as medications administered while in observation.  Recent changes in the last 24 hours include ed obs, med management, reassessment. .  Plan  Current plan is for [facility] placement. Dispo per [facility] team.  The patient has been placed in psychiatric observation due to the need to provide a safe environment for the patient while obtaining psychiatric consultation and evaluation, as well as ongoing medical and medication management to treat the patient's condition.      Bernard Drivers, MD 03/09/24 [phone]

## 2024-03-09 NOTE — ED Notes (Signed)
 Pt ambulated independently out of purple zone w/ [employer] officers. Stable upon departure and in NAD. All belongings and paperwork given to [facility] officers.

## 2024-03-09 NOTE — ED Notes (Signed)
 IVC'd 03/08/24, exp 03/15/24

## 2024-03-09 NOTE — Group Note (Signed)
 Date:  03/09/2024 Time:  4:41 PM  Group Topic/Focus:  Building Self Esteem:   The Focus of this group is helping patients become aware of the effects of self-esteem on their lives, the things they and others do that enhance or undermine their self-esteem, seeing the relationship between their level of self-esteem and the choices they make and learning ways to enhance self-esteem. Conflict Resolution:   The focus of this group is to discuss the conflict resolution process and how it may be used upon discharge. Healthy Communication:   The focus of this group is to discuss communication, barriers to communication, as well as healthy ways to communicate with others.    Participation Level:  Did Not Attend  Participation Quality:  Appropriate  Affect:  Appropriate  Cognitive:  Alert  Insight: Appropriate  Engagement in Group:  Engaged  Modes of Intervention:  Discussion   Jeremy Taylor 03/09/2024, 4:41 PM

## 2024-03-09 NOTE — BH Assessment (Signed)
 TTS Consult will be completed by [facility]. [facility] Coordinator will communicate in secure chat assessment time and provider name. Thanks

## 2024-03-09 NOTE — Plan of Care (Signed)
   Problem: Education: Goal: Emotional status will improve Outcome: Progressing Goal: Mental status will improve Outcome: Not Progressing

## 2024-03-09 NOTE — ED Notes (Signed)
 Pt refused risperidone  stating Risperidone  doesn't work for me. You can take this. Pt refused any food or drink at this time.

## 2024-03-09 NOTE — Progress Notes (Signed)
 Admission Note:  Pt admitted to [facility] from [facility] IVC.The patient has a longstanding history of paranoid schizophrenia, which has chronically has been comorbid with drug usage, especially cannabis and synthetic cannabinoids.Patient was brought to ED (apparently by authorities) and has been unable to give sustained, coherent responses since his arrival.  At first, could not even say his name, but now can do that, give his birth date, but otherwise he appears completely absorbed by responding to internal stimuli. Pt went through the admission process and seemed very unsteady and sedated. He signed his paperwork and completed his skin check without any issues appropriately responding. Polite. When asked what brings him here,  pt responds by saying I was afraid. I was hearing voices and it was scaring me. Skin check completed and unremarkable. Pt denies SI, HI. Pt oriented to the unit and given nourishment and a beverage. Pt was made a fall risk due to sedation. Pt encouraged to reach out to staff for any needs or concerns. Will continue to monitor.

## 2024-03-09 NOTE — Progress Notes (Signed)
 Pt ask for something for constipation. States he hasn't had a bowel movement in 3 days. MOM given and instructed on the importance of staying hydrated. Pitcher of water given for patient in his room.

## 2024-03-09 NOTE — Tx Team (Signed)
 Initial Treatment Plan 03/09/2024 4:09 PM Ledger D Migliaccio FMW:[id number]    PATIENT STRESSORS: Medication change or noncompliance     PATIENT STRENGTHS: Supportive family/friends    PATIENT IDENTIFIED PROBLEMS: Auditory Hallucinations  Medication Non- compliance                   DISCHARGE CRITERIA:  Improved stabilization in mood, thinking, and/or behavior Verbal commitment to aftercare and medication compliance  PRELIMINARY DISCHARGE PLAN: Outpatient therapy  PATIENT/FAMILY INVOLVEMENT: This treatment plan has been presented to and reviewed with the patient, Jeremy Taylor.  The patient and family have been given the opportunity to ask questions and make suggestions.  Jeremy JINNY Cassette, RN 03/09/2024, 4:09 PM

## 2024-03-10 LAB — LIPID PANEL
Cholesterol: 227 mg/dL — ABNORMAL HIGH (ref 0–200)
HDL: 34 mg/dL — ABNORMAL LOW
LDL Cholesterol: 182 mg/dL — ABNORMAL HIGH (ref 0–99)
Total CHOL/HDL Ratio: 6.8 ratio
Triglycerides: 57 mg/dL
VLDL: 11 mg/dL (ref 0–40)

## 2024-03-10 LAB — HEMOGLOBIN A1C
Hgb A1c MFr Bld: 5.5 % (ref 4.8–5.6)
Mean Plasma Glucose: 111.15 mg/dL

## 2024-03-10 MED ORDER — HYDROXYZINE HCL 25 MG PO TABS
25.0000 mg | ORAL_TABLET | Freq: Three times a day (TID) | ORAL | Status: DC | PRN
  Administered 2024-03-11 – 2024-03-18 (×5): 25 mg via ORAL
  Filled 2024-03-10 (×4): qty 1

## 2024-03-10 MED ADMIN — Risperidone Tab 2 MG: 2 mg | ORAL | NDC 00904736361

## 2024-03-10 MED FILL — Sertraline HCl Tab 25 MG: 25.0000 mg | ORAL | Qty: 1 | Status: AC

## 2024-03-10 MED FILL — Trazodone HCl Tab 50 MG: 50.0000 mg | ORAL | Qty: 1 | Status: AC

## 2024-03-10 NOTE — Group Note (Signed)
 LCSW Group Therapy Note   Group Date: 03/10/2024 Start Time: 1100 End Time: 1200   Participation:  did not attend  Type of Therapy:  Group Therapy  Topic:  Healing Hearts:  A Safe Space for Grief  Objective:   The objective of this class, Healing Hearts:  A Safe Space for Grief, is to create a compassionate environment where participants can process their grief, explore different stages of grief, and discover ways to honor their loved ones through personal rituals.  3 Goals: Provide a safe and supportive space where participants feel comfortable sharing their feelings and experiences of grief without judgment. Educate participants about the stages of grief and emphasize that there is no right way to grieve or a fixed timeline for healing. Introduce the concept of rituals as a means to process grief, allowing individuals to honor their loved ones in a personal and meaningful way.  Summary:  In Healing Hearts: A Safe Space for Grief, we explored the unique and personal journey of grief, emphasizing that everyone experiences it differently.  We discussed the five stages of grief (denial, anger, bargaining, depression, and acceptance), with the understanding that grief is not linear.  Rituals were introduced as a way to help cope with loss, offering comfort and connection through meaningful actions such as lighting candles or taking memory walks. Participants were encouraged to express their emotions, focus on self-care, and reflect on moments of gratitude for their loved ones, recognizing that healing is a process and there is no timeline for grief.  Therapeutic Modalities: Elements of CBT: Challenge thoughts, reframe beliefs, self-compassion Elements of DBT: Mindfulness, distress tolerance, emotion regulation Supportive Therapy:  Provide validation, foster a safe and supportive group environment, normalize grief   Jeremy Taylor, LCSWA 03/10/2024  12:35 PM

## 2024-03-10 NOTE — Group Note (Signed)
 Date:  03/10/2024 Time:  12:19 PM  Group Topic/Focus: Social Work    Pt did not attend social work group  Jacqueline Spofford R Ahriana Gunkel 03/10/2024, 12:19 PM

## 2024-03-10 NOTE — Group Note (Signed)
 Date:  03/10/2024 Time:  1:47 PM  Group Topic/Focus: Emotional Wellness Aims to help participants gain a deeper understanding of their anger and its underlying emotions. By exploring the iceberg model, the group focuses on recognizing that anger is often just the visible tip of a much larger emotional experience. The goal is to teach strategies for managing anger more effectively, promoting emotional awareness, and encouraging healthier emotional expression.     Participation Level:  Did Not Attend   Ronne Stefanski R Rebbecca Osuna 03/10/2024, 1:47 PM

## 2024-03-10 NOTE — [facility] Counselor (Signed)
 Adult Comprehensive Assessment  Patient ID: Jeremy Taylor, male   DOB: 25-Oct-2000, 24 y.o.   MRN: [id number]  CSW attempted to conduct PSA (1st attempt) at 9:30 AM, however pt refused to respond to attempts to engage with him.  CSW will reattempt again later in the day or tomorrow (03/11/24).   Derick JONELLE Blanch, LCSW 03/10/2024

## 2024-03-10 NOTE — Progress Notes (Signed)
(  Sleep Hours) -6.5 as of 0530 (Any PRNs that were needed, meds refused, or side effects to meds)-prn  MOM @2202  (Any disturbances and when (visitation, over night)-none (Concerns raised by the patient)- none (SI/HI/AVH)- denies all

## 2024-03-10 NOTE — Group Note (Signed)
 Date:  03/10/2024 Time:  6:22 PM  Group Topic/Focus: Occupational Therapy    Pt did not attend occupational therapy group  Edna Grover R Shavon Ashmore 03/10/2024, 6:22 PM

## 2024-03-10 NOTE — [facility] Suicide Risk Assessment (Addendum)
 Suicide Risk Assessment  Admission Assessment    [facility] Admission Suicide Risk Assessment   Nursing information obtained from:  Patient Demographic factors:  Male, Unemployed, Low socioeconomic status Current Mental Status:  NA Loss Factors:  NA Historical Factors:  NA Risk Reduction Factors:  NA  Total Time spent with patient: 45 minutes Principal Problem: Paranoid schizophrenia (HCC) Diagnosis:  Principal Problem:   Paranoid schizophrenia (HCC)  Subjective Data:  Jeremy Taylor is a 24 year old African-American male with prior psychiatric diagnosis significant for paranoid schizophrenia, cannabis dependence, who presents involuntarily to [facility] from [facility] health ED at [facility] worsening memory loss, psychiatric evaluation and hallucinations in the context of cannabis dependence.  BAL less than 15, UDS positive for marijuana.   Continued Clinical Symptoms:  Alcohol Use Disorder Identification Test Final Score (AUDIT): 0 The Alcohol Use Disorders Identification Test, Guidelines for Use in Primary Care, Second Edition.  World Science Writer [facility]). Score between 0-7:  no or low risk or alcohol related problems. Score between 8-15:  moderate risk of alcohol related problems. Score between 16-19:  high risk of alcohol related problems. Score 20 or above:  warrants further diagnostic evaluation for alcohol dependence and treatment.  CLINICAL FACTORS:   Depression:   Anhedonia Hopelessness Severe Schizophrenia:   Depressive state Less than 40 years old Paranoid or undifferentiated type More than one psychiatric diagnosis Previous Psychiatric Diagnoses and Treatments Medical Diagnoses and Treatments/Surgeries  Musculoskeletal: Strength & Muscle Tone: within normal limits Gait & Station: normal Patient leans: N/A  Psychiatric Specialty Exam:  Presentation  General Appearance:  Bizarre  Eye  Contact: Fleeting  Speech: Blocked; Other (comment)  Speech Volume: Normal  Handedness: Right  Mood and Affect  Mood: Dysphoric  Affect: Congruent  Thought Process  Thought Processes: Irrevelant  Descriptions of Associations:Loose  Orientation:Other (comment)  Thought Content:Paranoid Ideation; Delusions  History of Schizophrenia/Schizoaffective disorder:Yes  Duration of Psychotic Symptoms:Greater than six months  Hallucinations:Hallucinations: -- (Unable to assess (UTA))  Ideas of Reference:Paranoia; Delusions  Suicidal Thoughts:Suicidal Thoughts: -- (UTA)  Homicidal Thoughts:Homicidal Thoughts: -- (UTA)  Sensorium  Memory: Immediate Poor; Recent Poor  Judgment: Poor  Insight: Poor  Executive Functions  Concentration: Poor  Attention Span: Poor  Recall: Poor  Fund of Knowledge: Poor  Language: Poor  Psychomotor Activity  Psychomotor Activity: Psychomotor Activity: Decreased  Assets  Assets: -- (UTA)  Sleep  Sleep: Sleep: Good Number of Hours of Sleep: 6.5  Physical Exam: Physical Exam ROS Blood pressure 112/73, pulse 72, temperature 98.2 F (36.8 C), temperature source Oral, resp. rate 18, height 5' 9 (1.753 m), weight 62.6 kg, SpO2 100%. Body mass index is 20.38 kg/m.  COGNITIVE FEATURES THAT CONTRIBUTE TO RISK:  Polarized thinking    SUICIDE RISK:   Severe:  Frequent, intense, and enduring suicidal ideation, specific plan, no subjective intent, but some objective markers of intent (i.e., choice of lethal method), the method is accessible, some limited preparatory behavior, evidence of impaired self-control, severe dysphoria/symptomatology, multiple risk factors present, and few if any protective factors, particularly a lack of social support.  PLAN OF CARE: Treatment Plan Summary: Daily contact with patient to assess and evaluate symptoms and progress in treatment and Medication management  Observation  Level/Precautions:  15 minute checks  Laboratory:   CBC: Within normal limits Chemistry Profile: Albumin 5.2, glucose 156, otherwise normal Folic Acid:  GGT: N/A Lipid panel: Cholesterol 227, HDL 34, LDL 182.  Otherwise normal TSH: Ordered HCG: N/A UDS: Positive  for marijuana UA: Hazy, ketones 20, protein 30, specific gravity 1.035, otherwise normal Vitamin B-12: N/A Hemoglobin A1c 5.5 EKG: Normal sinus rhythm with sinus arrhythmia, ventricular rate 63, QT/QTc 390/399  Psychotherapy: Therapeutic milieu  Medications: See MAR  Consultations: N/A  Discharge Concerns: Safety  Estimated LOS: 5 to 7 days  Other:     Assessment: Jeremy Taylor is a 24 year old African-American male with prior psychiatric diagnosis significant for paranoid schizophrenia, cannabis dependence, who presents voluntarily to [facility] from [facility] health ED at [facility] worsening memory loss, psychiatric evaluation and hallucinations in the context of cannabis dependence.  BAL less than 15, UDS positive for marijuana.   Physician Treatment Plan for Primary Diagnosis: Paranoid schizophrenia (HCC)  Plans: Medications: Resume home medications --Continue Risperdal  tablets 2 mg p.o. q. nightly for schizophrenia --Continue Zoloft  tablet 25 mg p.o. daily for depression and anxiety -- Continue trazodone  tablet 50 mg p.o. daily for insomnia -- Hydroxyzine  tablet 25 mg p.o. 3 times daily as needed for anxiety  Continue [facility] agitation protocol as recommended  Medication for other medical problems: None  Other PRN Medications -Acetaminophen  650 mg every 6 as needed/mild pain -Maalox 30 mL oral every 4 as needed/digestion -Magnesium  hydroxide 30 mL daily as needed/mild constipation  --The risks/benefits/side-effects/alternatives to this medication were discussed in detail with the patient and time was given for questions. The patient consents to medication trial.  -- Metabolic  profile and EKG monitoring obtained while on an atypical antipsychotic (BMI: Lipid Panel: HbgA1c: QTc:)  -- Encouraged patient to participate in unit milieu and in scheduled group therapies  Safety and Monitoring: Voluntary admission to inpatient psychiatric unit for safety, stabilization and treatment Daily contact with patient to assess and evaluate symptoms and progress in treatment Patient's case to be discussed in multi-disciplinary team meeting Observation Level : q15 minute checks Vital signs: q12 hours Precautions: suicide, but pt currently verbally contracts for safety on unit    Discharge Planning: Social work and case management to assist with discharge planning and identification of hospital follow-up needs prior to discharge Estimated LOS: 5-7 days Discharge Concerns: Need to establish Safety plan; Medication compliance and effectiveness Discharge Goals: Return home with outpatient referrals for mental health follow-up including medication management/psychotherapy.  Long Term Goal(s): Improvement in symptoms so as ready for discharge  Short Term Goals: Ability to identify changes in lifestyle to reduce recurrence of condition will improve, Ability to verbalize feelings will improve, Ability to disclose and discuss suicidal ideas, Ability to demonstrate self-control will improve, Ability to identify and develop effective coping behaviors will improve, Ability to maintain clinical measurements within normal limits will improve, Compliance with prescribed medications will improve, and Ability to identify triggers associated with substance abuse/mental health issues will improve  Physician Treatment Plan for Secondary Diagnosis: Principal Problem:   Paranoid schizophrenia (HCC)  I certify that inpatient services furnished can reasonably be expected to improve the patient's condition.   Ellouise JAYSON Azure, FNP 03/10/2024, 6:08 PM

## 2024-03-10 NOTE — Group Note (Signed)
 Date:  03/10/2024 Time:  9:40 AM  Group Topic/Focus:  Goals Group:   The focus of this group is to help patients establish daily goals to achieve during treatment and discuss how the patient can incorporate goal setting into their daily lives to aide in recovery. Orientation:   The focus of this group is to educate the patient on the purpose and policies of crisis stabilization and provide a format to answer questions about their admission.  The group details unit policies and expectations of patients while admitted.    Participation Level:  Did Not Attend  Jonesha Tsuchiya R Andrew Blasius 03/10/2024, 9:40 AM

## 2024-03-10 NOTE — Plan of Care (Signed)
   Problem: Education: Goal: Knowledge of [place] [facility] Education information/materials will improve Outcome: Progressing Goal: Emotional status will improve Outcome: Progressing Goal: Mental status will improve Outcome: Progressing Goal: Verbalization of understanding the information provided will improve Outcome: Progressing

## 2024-03-10 NOTE — [facility] Group Notes (Signed)
 Adult Psychoeducational Group Note  Date:  03/10/2024 Time:  8:58 PM  Group Topic/Focus:  Wrap-Up Group:   The focus of this group is to help patients review their daily goal of treatment and discuss progress on daily workbooks.  Participation Level:  Minimal  Participation Quality:  Drowsy  Affect:  Flat  Cognitive:  Appropriate  Insight: Good  Engagement in Group:  Engaged  Modes of Intervention:  Discussion  Additional Comments:  Patient attended the Wrap-up group.  Jeremy Taylor 03/10/2024, 8:58 PM

## 2024-03-10 NOTE — [facility] Counselor (Signed)
 Adult Comprehensive Assessment  Patient ID: Jeremy Taylor, male   DOB: 2000-03-31, 24 y.o.   MRN: [id number]  ATTEMPT:  CSW attempted to complete an assessment around 10 AM, however patient was lying in bed and didn't respond to verbal attempts to engage.  Patient appeared to be asleep.   Muneeb Veras O Carrington Mullenax, LCSWA 03/10/2024

## 2024-03-10 NOTE — H&P (Addendum)
 " Psychiatric Admission Assessment Adult  Patient Identification: Jeremy Taylor MRN:  [id number] Date of Evaluation:  03/10/2024 Chief Complaint:  Paranoid schizophrenia (HCC) [F20.0] Principal Diagnosis: Paranoid schizophrenia (HCC) Diagnosis:  Principal Problem:   Paranoid schizophrenia (HCC)  CC: I do not know.  History of Present Illness: Jeremy Taylor is a 24 year old African-American male with prior psychiatric diagnosis significant for paranoid schizophrenia, cannabis dependence, who presents involuntarily to [facility] from [facility] health ED at [facility] worsening memory loss, psychiatric evaluation and hallucinations in the context of cannabis dependence.  BAL less than 15, UDS positive for marijuana.  After medical evaluation, stabilization and clearance, patient was transferred to [facility] for further psychiatric evaluation and treatment.  During this evaluation, patient thought process was severely blocked and appeared to be responding to internal and external stimuli.  He was observed to be standing and fixatively looking at the ceiling and exhibiting psychomotor retardation.  And unable to answer any assessment questions.  As per chart review from ED: Jeremy Taylor is a 24 y.o. male initially presented to the ER by himself as a [name].  Triage note mentions patient could not say his name however when I evaluated patient he was able to give me his name and date of birth.  He does appear to be responding to internal stimuli.  When looking up his previous health record, does appear he has a history of schizophrenia and paranoia as well as delusional disorder.  He has been seen before for these issues as well as for hallucinations.  He reports visual and auditory hallucinations to me.  Reports that he can hear and see the voices.  He does display some thought blocking and does appear to be responding to internal stimuli.  IVC paperwork completed.   Given patient's hallucinations and psych history, level 5 caveat.  He currently has no complaints.  Denies any drug use but patient smells very heavily of marijuana.   Objective: Patient presents alert to name only and this provider was unable to assess psychiatric review of systems.  Speech is severely blocked and incoherent.  Unable to assess thought content and thought process.  Objectively responding to internal and external stimuli as indicated above.  Vital signs reviewed without critical values.  Labs and EKG reviewed as indicated in the treatment plan.  Patient is admitted for mood stabilization, safety, and medication management.  Mode of transport to Hospital: Safe transport Current Outpatient (Home) Medication List: See Home medication listing PRN medication prior to evaluation: See home medication listing  ED course: Labs and EKG were obtained and analyzed Collateral Information: Not obtained at this time POA/Legal Guardian:  Past Psychiatric Hx: As per chart review Previous Psych Diagnoses: Paranoid schizophrenia and marijuana dependence Prior inpatient treatment: Unable to assess (UTA) Current/prior outpatient treatment: UTA Prior rehab hx: UTA Psychotherapy hx: UTA History of suicide: UTA History of homicide or aggression: UTA Psychiatric medication history: Patient has been on trial Zoloft , Risperdal , and trazodone  Psychiatric medication compliance history: UTA Neuromodulation history: UTA Current Psychiatrist: UTA Current therapist: UTA  Substance Abuse Hx: Alcohol: BAL less than 15 Tobacco: UTA Illicit drugs: UDS positive for marijuana Rx drug abuse: UTA Rehab hx: UTA  Past Medical History: Medical Diagnoses: UTA Home Rx: UTA Prior Hosp: UTA Prior Surgeries/Trauma: UTA use Head trauma, LOC, concussions, seizures: TA Allergies: Not indicated in the file LMP: N/A Contraception: N/A  PCP: UTA  Family History: Medical: UTA Psych: UTA Psych Rx: UTA  SA/HA:  UTA Substance use family hx: UTA  Social History: Childhood (bring, raised, lives now, parents, siblings, schooling, education): UTA Abuse: UTA Marital Status:UTA Sexual orientation:UTA Children:UTA Employment:UTA Peer Group:UTA Housing:UTA Finances:UTA Legal:UTA Military:  Associated Signs/Symptoms: Depression Symptoms:  depressed mood, anhedonia, fatigue, hopelessness, anxiety, (Hypo) Manic Symptoms:  Delusions, Hallucinations, Anxiety Symptoms:  Excessive Worry, Psychotic Symptoms:  Delusions, Hallucinations: Unable to assess Paranoia, PTSD Symptoms: Unable to assess Total Time spent with patient: 1.5 hours  Is the patient at risk to self? Yes.    Has the patient been a risk to self in the past 6 months? Yes.    Has the patient been a risk to self within the distant past? Yes.    Is the patient a risk to others? Yes.    Has the patient been a risk to others in the past 6 months? Yes.    Has the patient been a risk to others within the distant past? Yes.     [place] Scale:  Flowsheet Row [facility] (Current) [facility] from 03/08/2024 in [facility] at [facility]  C-SSRS RISK CATEGORY No Risk No Risk   Alcohol Screening: 1. How often do you have a drink containing alcohol?: Never 2. How many drinks containing alcohol do you have on a typical day when you are drinking?: 1 or 2 3. How often do you have six or more drinks on one occasion?: Never AUDIT-C Score: 0 4. How often during the last year have you found that you were not able to stop drinking once you had started?: Never 5. How often during the last year have you failed to do what was normally expected from you because of drinking?: Never 6. How often during the last year have you needed a first drink in the morning to get yourself going after a heavy drinking session?: Never 7. How often during the last year have you had a  feeling of guilt of remorse after drinking?: Never 8. How often during the last year have you been unable to remember what happened the night before because you had been drinking?: Never 9. Have you or someone else been injured as a result of your drinking?: No 10. Has a relative or friend or a doctor or another health worker been concerned about your drinking or suggested you cut down?: No Alcohol Use Disorder Identification Test Final Score (AUDIT): 0 Substance Abuse History in the last 12 months:  Yes.   Consequences of Substance Abuse: NA Previous Psychotropic Medications: Yes  Psychological Evaluations: Yes  Past Medical History: History reviewed. No pertinent past medical history. History reviewed. No pertinent surgical history. Family History: History reviewed. No pertinent family history. Tobacco Screening: Tobacco Use History[1]  BH Tobacco Counseling     Are you interested in Tobacco Cessation Medications?  No value filed. Counseled patient on smoking cessation:  No value filed. Reason Tobacco Screening Not Completed: Patient Refused Screening       Social History:  Social History   Substance and Sexual Activity  Alcohol Use None     Social History   Substance and Sexual Activity  Drug Use Not on file    Additional Social History:   Allergies:  Allergies[2] Lab Results:  Results for orders placed or performed during the hospital encounter of 03/09/24 (from the past 48 hours)  Lipid panel     Status: Abnormal   Collection Time: 03/10/24  6:24 AM  Result  Value Ref Range   Cholesterol 227 (H) 0 - 200 mg/dL    Comment:        ATP III CLASSIFICATION:  <200     mg/dL   Desirable  799-760  mg/dL   Borderline High  >=759    mg/dL   High           Triglycerides 57 <150 mg/dL   HDL 34 (L) >59 mg/dL   Total CHOL/HDL Ratio 6.8 RATIO   VLDL 11 0 - 40 mg/dL   LDL Cholesterol 817 (H) 0 - 99 mg/dL    Comment:        Total Cholesterol/HDL:CHD Risk Coronary Heart  Disease Risk Table                     Men   Women  1/2 Average Risk   3.4   3.3  Average Risk       5.0   4.4  2 X Average Risk   9.6   7.1  3 X Average Risk  23.4   11.0        Use the calculated Patient Ratio above and the CHD Risk Table to determine the patient's CHD Risk.        ATP III CLASSIFICATION (LDL):  <100     mg/dL   Optimal  899-870  mg/dL   Near or Above                    Optimal  130-159  mg/dL   Borderline  839-810  mg/dL   High  >809     mg/dL   Very High Performed at [facility], [address], [place], [place] [address]   Hemoglobin A1c     Status: None   Collection Time: 03/10/24  6:24 AM  Result Value Ref Range   Hgb A1c MFr Bld 5.5 4.8 - 5.6 %    Comment: (NOTE) Diagnosis of Diabetes The following HbA1c ranges recommended by the American Diabetes Association (ADA) may be used as an aid in the diagnosis of diabetes mellitus.  Hemoglobin             Suggested A1C NGSP%              Diagnosis  <5.7                   Non Diabetic  5.7-6.4                Pre-Diabetic  >6.4                   Diabetic  <7.0                   Glycemic control for                       adults with diabetes.     Mean Plasma Glucose 111.15 mg/dL    Comment: Performed at [facility], [address] [place], [place] [address]    Blood Alcohol level:  Lab Results  Component Value Date   [facility] <15 03/08/2024    Metabolic Disorder Labs:  Lab Results  Component Value Date   HGBA1C 5.5 03/10/2024   MPG 111.15 03/10/2024   No results found for: PROLACTIN Lab Results  Component Value Date   CHOL 227 (H) 03/10/2024   TRIG 57 03/10/2024   HDL 34 (L)  03/10/2024   CHOLHDL 6.8 03/10/2024   VLDL 11 03/10/2024   LDLCALC 182 (H) 03/10/2024   Current Medications: Current Facility-Administered Medications  Medication Dose Route Frequency Provider Last Rate Last Admin   acetaminophen  (TYLENOL ) tablet 650 mg  650 mg Oral Q6H PRN Mannie Jerel PARAS, NP       alum & mag hydroxide-simeth (MAALOX/MYLANTA) 200-200-20 MG/5ML suspension 30 mL  30 mL Oral Q4H PRN Mannie Jerel PARAS, NP       haloperidol  (HALDOL ) tablet 5 mg  5 mg Oral TID PRN Mannie Jerel PARAS, NP       And   diphenhydrAMINE  (BENADRYL ) capsule 50 mg  50 mg Oral TID PRN Mannie Jerel PARAS, NP       haloperidol  lactate (HALDOL ) injection 10 mg  10 mg Intramuscular TID PRN Mannie Jerel PARAS, NP       And   diphenhydrAMINE  (BENADRYL ) injection 50 mg  50 mg Intramuscular TID PRN Mannie Jerel PARAS, NP       And   LORazepam  (ATIVAN ) injection 2 mg  2 mg Intramuscular TID PRN Mannie Jerel PARAS, NP       haloperidol  lactate (HALDOL ) injection 5 mg  5 mg Intramuscular TID PRN Mannie Jerel PARAS, NP       And   diphenhydrAMINE  (BENADRYL ) injection 50 mg  50 mg Intramuscular TID PRN Mannie Jerel PARAS, NP       And   LORazepam  (ATIVAN ) injection 2 mg  2 mg Intramuscular TID PRN Mannie Jerel PARAS, NP       magnesium  hydroxide (MILK OF MAGNESIA) suspension 30 mL  30 mL Oral Daily PRN Mannie Jerel PARAS, NP   30 mL at 03/09/24 2202   nicotine  (NICODERM CQ  - dosed in mg/24 hours) patch 21 mg  21 mg Transdermal Q0600 Mannie Jerel PARAS, NP   21 mg at 03/10/24 9048   risperiDONE  (RISPERDAL ) tablet 2 mg  2 mg Oral Daily Mannie Jerel PARAS, NP   2 mg at 03/10/24 9048   PTA Medications: Medications Prior to Admission  Medication Sig Dispense Refill Last Dose/Taking   risperiDONE  (RISPERDAL ) 2 MG tablet Take 2 mg by mouth at bedtime. (Patient not taking: Reported on 03/09/2024)      sertraline  (ZOLOFT ) 25 MG tablet Take 25 mg by mouth daily. (Patient not taking: Reported on 03/09/2024)      traZODone  (DESYREL ) 50 MG tablet Take 50 mg by mouth at bedtime. (Patient not taking: Reported on 03/09/2024)      AIMS:  ,  ,  ,  ,  ,  ,    Musculoskeletal: Strength & Muscle Tone: within normal limits Gait & Station: normal Patient leans: N/A  Psychiatric Specialty Exam:  Presentation  General Appearance:   Bizarre  Eye Contact: Fleeting  Speech: Blocked; Other (comment)  Speech Volume: Normal  Handedness: Right  Mood and Affect  Mood: Dysphoric  Affect: Congruent  Thought Process  Thought Processes: Irrevelant  Duration of Psychotic Symptoms:Greater than 6 MONTHS Past Diagnosis of Schizophrenia or Psychoactive disorder: Yes  Descriptions of Associations:Loose  Orientation:Other (comment)  Thought Content:Paranoid Ideation; Delusions  Hallucinations:Hallucinations: -- (Unable to assess (UTA))  Ideas of Reference:Paranoia; Delusions  Suicidal Thoughts:Suicidal Thoughts: -- (UTA)  Homicidal Thoughts:Homicidal Thoughts: -- (UTA)  Sensorium  Memory: Immediate Poor; Recent Poor  Judgment: Poor  Insight: Poor  Executive Functions  Concentration: Poor  Attention Span: Poor  Recall: Poor  Fund of Knowledge: Poor  Language: Poor  Psychomotor Activity  Psychomotor Activity:  Psychomotor Activity: Decreased  Assets  Assets: -- (UTA)  Sleep  Sleep: Sleep: Good  Estimated Sleeping Duration (Last 24 Hours): 5.50-7.50 hours  Physical Exam: Physical Exam Vitals and nursing note reviewed.  Constitutional:      General: He is not in acute distress.    Appearance: He is normal weight. He is not ill-appearing.  HENT:     Head: Normocephalic.     Right Ear: External ear normal.     Left Ear: External ear normal.     Nose: Nose normal.     Mouth/Throat:     Pharynx: Oropharynx is clear.  Eyes:     Extraocular Movements: Extraocular movements intact.  Cardiovascular:     Rate and Rhythm: Normal rate.     Pulses: Normal pulses.  Pulmonary:     Effort: Pulmonary effort is normal. No respiratory distress.  Musculoskeletal:        General: Normal range of motion.     Cervical back: Normal range of motion.  Skin:    General: Skin is dry.  Neurological:     Mental Status: He is alert. He is disoriented.     Comments: Unable to assess   Psychiatric:     Comments: Unable to assess    Review of Systems  Constitutional:  Negative for chills and fever.  Eyes:  Negative for blurred vision.  Respiratory:  Negative for cough, sputum production, shortness of breath and wheezing.   Cardiovascular:  Negative for chest pain and palpitations.  Gastrointestinal:  Negative for abdominal pain, constipation, diarrhea, heartburn, nausea and vomiting.  Skin:  Negative for itching and rash.  Neurological:  Negative for dizziness and headaches.  Endo/Heme/Allergies: Negative.   Psychiatric/Behavioral:  Positive for depression. Negative for hallucinations, substance abuse and suicidal ideas. The patient is nervous/anxious. The patient does not have insomnia.    Blood pressure 112/73, pulse 72, temperature 98.2 F (36.8 C), temperature source Oral, resp. rate 18, height 5' 9 (1.753 m), weight 62.6 kg, SpO2 100%. Body mass index is 20.38 kg/m.  Treatment Plan Summary: Daily contact with patient to assess and evaluate symptoms and progress in treatment and Medication management  Observation Level/Precautions:  15 minute checks  Laboratory:   CBC: Within normal limits Chemistry Profile: Albumin 5.2, glucose 156, otherwise normal Folic Acid:  GGT: N/A Lipid panel: Cholesterol 227, HDL 34, LDL 182.  Otherwise normal TSH: Ordered HCG: N/A Vitamin D  25-hydroxy: Ordered UDS: Positive for marijuana UA: Hazy, ketones 20, protein 30, specific gravity 1.035, otherwise normal Vitamin B-12: N/A Hemoglobin A1c 5.5 EKG: Normal sinus rhythm with sinus arrhythmia, ventricular rate 63, QT/QTc 390/399  Psychotherapy: Therapeutic milieu  Medications: See MAR  Consultations: N/A  Discharge Concerns: Safety  Estimated LOS: 5 to 7 days  Other:     Assessment: Djibril D Buerkle is a 24 year old African-American male with prior psychiatric diagnosis significant for paranoid schizophrenia, cannabis dependence, who presents voluntarily to [facility] from [facility] health ED at [facility] worsening memory loss, psychiatric evaluation and hallucinations in the context of cannabis dependence.  BAL less than 15, UDS positive for marijuana.   Physician Treatment Plan for Primary Diagnosis: Paranoid schizophrenia (HCC)  Plans: Medications: Resume home medications --Continue Risperdal  tablets 2 mg p.o. q. nightly for schizophrenia --Continue Zoloft  tablet 25 mg p.o. daily for depression and anxiety -- Continue trazodone  tablet 50 mg p.o. daily for insomnia -- Continue hydroxyzine  tablet 25 mg p.o. 3 times daily as needed for anxiety  Continue [facility] agitation protocol as recommended  Medication for other medical problems: None  Other PRN Medications -Acetaminophen  650 mg every 6 as needed/mild pain -Maalox 30 mL oral every 4 as needed/digestion -Magnesium  hydroxide 30 mL daily as needed/mild constipation  --The risks/benefits/side-effects/alternatives to this medication were discussed in detail with the patient and time was given for questions. The patient consents to medication trial.  -- Metabolic profile and EKG monitoring obtained while on an atypical antipsychotic (BMI: Lipid Panel: HbgA1c: QTc:)  -- Encouraged patient to participate in unit milieu and in scheduled group therapies  Safety and Monitoring: Voluntary admission to inpatient psychiatric unit for safety, stabilization and treatment Daily contact with patient to assess and evaluate symptoms and progress in treatment Patient's case to be discussed in multi-disciplinary team meeting Observation Level : q15 minute checks Vital signs: q12 hours Precautions: suicide, but pt currently verbally contracts for safety on unit    Discharge Planning: Social work and case management to assist with discharge planning and identification of hospital follow-up needs prior to discharge Estimated LOS: 5-7 days Discharge Concerns: Need to establish Safety plan;  Medication compliance and effectiveness Discharge Goals: Return home with outpatient referrals for mental health follow-up including medication management/psychotherapy.  Long Term Goal(s): Improvement in symptoms so as ready for discharge  Short Term Goals: Ability to identify changes in lifestyle to reduce recurrence of condition will improve, Ability to verbalize feelings will improve, Ability to disclose and discuss suicidal ideas, Ability to demonstrate self-control will improve, Ability to identify and develop effective coping behaviors will improve, Ability to maintain clinical measurements within normal limits will improve, Compliance with prescribed medications will improve, and Ability to identify triggers associated with substance abuse/mental health issues will improve  Physician Treatment Plan for Secondary Diagnosis: Principal Problem:   Paranoid schizophrenia (HCC)  I certify that inpatient services furnished can reasonably be expected to improve the patient's condition.    Ellouise JAYSON Azure, FNP 1/1/20266:12 PM     [1]  Social History Tobacco Use  Smoking Status Never  Smokeless Tobacco Never  [2] Not on File  "

## 2024-03-10 NOTE — Plan of Care (Signed)
 Pt was in his room reading most of the day RTIS from time to time. Thought blocking during questioning about symptoms at med pass. Went to [employer] and ate adequately. Didn't attend groups. Denies SI/HI/SH/paranoia/AVH. Will continue to monitor.

## 2024-03-11 ENCOUNTER — Encounter (HOSPITAL_COMMUNITY): Payer: Self-pay

## 2024-03-11 LAB — VITAMIN D 25 HYDROXY (VIT D DEFICIENCY, FRACTURES): Vit D, 25-Hydroxy: 8.6 ng/mL — ABNORMAL LOW (ref 30–100)

## 2024-03-11 LAB — TSH: TSH: 0.639 u[IU]/mL (ref 0.350–4.500)

## 2024-03-11 MED ORDER — VITAMIN D (ERGOCALCIFEROL) 1.25 MG (50000 UNIT) PO CAPS
50000.0000 [IU] | ORAL_CAPSULE | ORAL | Status: DC
  Administered 2024-03-18: 50000 [IU] via ORAL
  Filled 2024-03-11: qty 1

## 2024-03-11 MED ADMIN — Risperidone Tab 2 MG: 2 mg | ORAL | NDC 00904736361

## 2024-03-11 MED ADMIN — Sertraline HCl Tab 25 MG: 25 mg | ORAL | NDC 60687023111

## 2024-03-11 NOTE — Group Note (Signed)
 Date:  03/11/2024 Time:  2:39 PM  Group Topic/Focus: Music Wellness Group  Patients participated in a guess the song activity using appropriate music played from the MHTs phone. This challenge promoted critical thinking and memory recall while encouraging active participation. Patients were engaged through singing along, listening closely, and interacting with peers, which supported socialization, mood elevation, and overall cognitive engagement.    Participation Level:  Did Not Attend  Additional Comments:  Pt did not attend this group  Kristi HERO [facility] 03/11/2024, 2:39 PM

## 2024-03-11 NOTE — Group Note (Signed)
 Recreation Therapy Group Note   Group Topic:General Recreation  Group Date: 03/11/2024 Start Time: 9062 End Time: 1025 Facilitators: Bowie Delia-McCall, LRT,CTRS Location: [address] Dayroom   Group Topic/Focus: General Recreation   Goal Area(s) Addresses:  Patient will use appropriate interactions in play with peers.   Patient will follow directions on first prompt.  Behavioral Response:   Intervention: Play and Mental Exercise  Activity: Patients were allowed free play during recreation therapy group session today. Patients had to option of doing chess, puzzles or trivia game Guess the Lyric.    Affect/Mood: N/A   Participation Level: Did not attend    Clinical Observations/Individualized Feedback:      Plan: Continue to engage patient in RT group sessions 2-3x/week.   Mendel Binsfeld-McCall, LRT,CTRS 03/11/2024 1:08 PM

## 2024-03-11 NOTE — [facility] Counselor (Signed)
 Adult Comprehensive Assessment  Patient ID: Jeremy Taylor, male   DOB: 2000-11-15, 24 y.o.   MRN: [id number]  Information Source: Information source: Patient  Current Stressors:  Patient states their primary concerns and needs for treatment are:: Seeing and hearing voices, been going on for awhile Patient states their goals for this hospitilization and ongoing recovery are:: I want to work on my mental health Educational / Learning stressors: None reported Employment / Job issues: None reported Family Relationships: None reported Surveyor, Quantity / Lack of resources (include bankruptcy): None reported Housing / Lack of housing: None reported Physical health (include injuries & life threatening diseases): None reported Social relationships: None reported Substance abuse: None reported Bereavement / Loss: None reported  Living/Environment/Situation:  Living Arrangements: Other relatives Living conditions (as described by patient or guardian): Apartment Who else lives in the home?: Brother How long has patient lived in current situation?: 1 year What is atmosphere in current home: Comfortable, Supportive, Loving  Family History:  Marital status: Single Are you sexually active?: No What is your sexual orientation?: Straight Has your sexual activity been affected by drugs, alcohol, medication, or emotional stress?: No Does patient have children?: Yes How many children?: 2 How is patient's relationship with their children?: We have a good relationship  Childhood History:  By whom was/is the patient raised?: Grandparents, Father, Mother Additional childhood history information: Grandparents and mom and dad, parents were together Description of patient's relationship with caregiver when they were a child: It was good Patient's description of current relationship with people who raised him/her: It is still good How were you disciplined when you got in trouble as a  child/adolescent?: Spankings Does patient have siblings?: Yes Number of Siblings: 6 Description of patient's current relationship with siblings: It is good Did patient suffer any verbal/emotional/physical/sexual abuse as a child?: No Did patient suffer from severe childhood neglect?: No Has patient ever been sexually abused/assaulted/raped as an adolescent or adult?: No Was the patient ever a victim of a crime or a disaster?: No Witnessed domestic violence?: No Has patient been affected by domestic violence as an adult?: No  Education:  Highest grade of school patient has completed: 10th Currently a student?: No Learning disability?: No  Employment/Work Situation:   Employment Situation: Unemployed Patient's Job has Been Impacted by Current Illness: No What is the Longest Time Patient has Held a Job?: 2 years 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) Does patient have a lawyer or guardian?: No  Alcohol/Substance Abuse:   What has been your use of drugs/alcohol within the last 12 months?: None reported If attempted suicide, did drugs/alcohol play a role in this?: No Alcohol/Substance Abuse Treatment Hx: Past Tx, Inpatient If yes, describe treatment and response: I think I was here before for that I don't know I think 9 months ago Is patient motivated for change?:  (States not currently using) Does patient live in an environment that promotes recovery or serves as an obstacle to recovery?: No (NA) Are others in the home using alcohol or other substances?: No Are significant others in the home willing to participate in the patient's care?: Yes Describe significant others willing to participate in the patient's care: Brother is supportive of pt Has alcohol/substance abuse ever  caused legal problems?: No  Social Support System:   [employer]  Support System: Good Describe Community Support System: Mom Type of faith/religion: None reported How does patient's faith help to cope with current illness?: NA  Leisure/Recreation:   Do You Have Hobbies?: No  Strengths/Needs:   What is the patient's perception of their strengths?: I can cope well Patient states they can use these personal strengths during their treatment to contribute to their recovery: I am using coping skills here to get better Patient states these barriers may affect/interfere with their treatment: None reported Patient states these barriers may affect their return to the community: None reported  Discharge Plan:   Currently receiving community mental health services: No Patient states concerns and preferences for aftercare planning are: I don't see anyone now, you can set me up when I leave Patient states they will know when they are safe and ready for discharge when: I am not sure yet, I was going to ask Does patient have access to transportation?: Yes Does patient have financial barriers related to discharge medications?: No Will patient be returning to same living situation after discharge?: Yes  Summary/Recommendations:   Summary and Recommendations (to be completed by the evaluator): Jeremy Taylor is a 25yo male who is involuntarily admitted to [facility] to [facility] due to hallucinations and medication noncompliance. Pt endorses no stressors, lives at home with his brother whom he has a positive relationship with. States he has been hearing and seeing voices for awhile but not since being in the hospital. 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 Denies any and all substance use, UDS positive for marijuana. Has 2 children, reports a positive relationship with them as well. Does not currently follow up with a therapist or psychiatrist,  open to appointments at discharge. Denies SI, HI, AVH currently. Pt calm and cooperative during PSA, minimal with responses but did not appear to be responding to internal stimuli. While here, Jeremy Taylor can benefit from crisis stabilization, medication management, therapeutic milieu, and referrals for services.   Jeremy Taylor. 03/11/2024

## 2024-03-11 NOTE — Progress Notes (Signed)
(  Sleep Hours) -6.5 as of 0530 (Any PRNs that were needed, meds refused, or side effects to meds)- refused zoloft  (Any disturbances and when (visitation, over night)-none (Concerns raised by the patient)- none (SI/HI/AVH)- denies all  Pt was asked 3 times to please come for night medications and informed that the Dr wants to start him on zoloft  this evening. Pt only grunted while in bed, refused to get up

## 2024-03-11 NOTE — Progress Notes (Signed)
" °   03/11/24 1400  Psych Admission Type (Psych Patients Only)  Admission Status Involuntary  Psychosocial Assessment  Patient Complaints None  Eye Contact Brief  Facial Expression Blank  Affect Anxious  Speech Logical/coherent  Interaction Guarded  Motor Activity Slow  Appearance/Hygiene Unremarkable  Behavior Characteristics Cooperative;Anxious  Mood Preoccupied  Thought Process  Coherency WDL  Content WDL  Delusions None reported or observed  Perception WDL  Hallucination None reported or observed  Judgment Limited  Confusion None  Danger to Self  Current suicidal ideation? Denies  Danger to Others  Danger to Others None reported or observed    "

## 2024-03-11 NOTE — Group Note (Signed)
 Date:  03/11/2024 Time:  10:55 AM  Group Topic/Focus: Goals Group  Patients were invited to introduce themselves using an icebreaker question about their New Year's resolutions. Following this, patients received a goals worksheet designed to help them outline their objectives for the upcoming week, month, and year. Participants were encouraged to share their goals with the group, along with any obstacles they anticipate facing. The group setting provided a safe and supportive space, fostering an environment where patients felt comfortable expressing themselves and engaging in open discussion.    Participation Level:  Did Not Attend  Additional Comments:  Pt did not attend goals group  Kristi HERO [facility] 03/11/2024, 10:55 AM

## 2024-03-11 NOTE — Group Note (Signed)
 Date:  03/11/2024 Time:  1:25 PM  Group Topic/Focus: Social Wellness  This social wellness group focused on the theme of control, including how it feels to have control, lose control, and share control with others. Patients sat in a circle and began with a blank piece of paper. Each patient was asked to draw one thing, after which a timer was set for one and a half minutes. When the timer went off, patients passed their papers to the left and continued adding to the drawings they received. Patients were encouraged to be creative and adapt to the existing drawings as they felt appropriate. At the conclusion of the activity, patients shared their completed drawings and discussed their experiences, emotions, and interpretations of the activity. The group processed how the exercise related to control in daily life and how using positive coping skills can help shift perspectives and responses to situations involving control.    Participation Level:  Did Not Attend  Additional Comments:  Pt did not attend this group  Kristi HERO [facility] 03/11/2024, 1:25 PM

## 2024-03-11 NOTE — BH IP Treatment Plan (Signed)
 Interdisciplinary Treatment and Diagnostic Plan Update  03/11/2024 Time of Session: 10:15 AM Jeremy Taylor MRN: [id number]  Principal Diagnosis: Paranoid schizophrenia (HCC)  Secondary Diagnoses: Principal Problem:   Paranoid schizophrenia (HCC)   Current Medications:  Current Facility-Administered Medications  Medication Dose Route Frequency Provider Last Rate Last Admin   acetaminophen  (TYLENOL ) tablet 650 mg  650 mg Oral Q6H PRN Mannie Jerel PARAS, NP       alum & mag hydroxide-simeth (MAALOX/MYLANTA) 200-200-20 MG/5ML suspension 30 mL  30 mL Oral Q4H PRN Mannie Jerel PARAS, NP       haloperidol  (HALDOL ) tablet 5 mg  5 mg Oral TID PRN Mannie Jerel PARAS, NP       And   diphenhydrAMINE  (BENADRYL ) capsule 50 mg  50 mg Oral TID PRN Mannie Jerel PARAS, NP       haloperidol  lactate (HALDOL ) injection 10 mg  10 mg Intramuscular TID PRN Mannie Jerel PARAS, NP       And   diphenhydrAMINE  (BENADRYL ) injection 50 mg  50 mg Intramuscular TID PRN Mannie Jerel PARAS, NP       And   LORazepam  (ATIVAN ) injection 2 mg  2 mg Intramuscular TID PRN Mannie Jerel PARAS, NP       haloperidol  lactate (HALDOL ) injection 5 mg  5 mg Intramuscular TID PRN Mannie Jerel PARAS, NP       And   diphenhydrAMINE  (BENADRYL ) injection 50 mg  50 mg Intramuscular TID PRN Mannie Jerel PARAS, NP       And   LORazepam  (ATIVAN ) injection 2 mg  2 mg Intramuscular TID PRN Mannie Jerel PARAS, NP       hydrOXYzine  (ATARAX ) tablet 25 mg  25 mg Oral TID PRN Ntuen, Tina C, FNP   25 mg at 03/11/24 0848   magnesium  hydroxide (MILK OF MAGNESIA) suspension 30 mL  30 mL Oral Daily PRN Mannie Jerel PARAS, NP   30 mL at 03/09/24 2202   nicotine  (NICODERM CQ  - dosed in mg/24 hours) patch 21 mg  21 mg Transdermal Q0600 Mannie Jerel PARAS, NP   21 mg at 03/11/24 9150   risperiDONE  (RISPERDAL ) tablet 2 mg  2 mg Oral Daily Mannie Jerel PARAS, NP   2 mg at 03/11/24 9149   sertraline  (ZOLOFT ) tablet 25 mg  25 mg Oral Daily Ntuen, Tina C, FNP   25 mg at 03/11/24  0848   traZODone  (DESYREL ) tablet 50 mg  50 mg Oral QHS PRN Ntuen, Tina C, FNP       PTA Medications: Medications Prior to Admission  Medication Sig Dispense Refill Last Dose/Taking   risperiDONE  (RISPERDAL ) 2 MG tablet Take 2 mg by mouth at bedtime. (Patient not taking: Reported on 03/09/2024)      sertraline  (ZOLOFT ) 25 MG tablet Take 25 mg by mouth daily. (Patient not taking: Reported on 03/09/2024)      traZODone  (DESYREL ) 50 MG tablet Take 50 mg by mouth at bedtime. (Patient not taking: Reported on 03/09/2024)       Patient Stressors: Medication change or noncompliance    Patient Strengths: Supportive family/friends   Treatment Modalities: Medication Management, Group therapy, Case management,  1 to 1 session with clinician, Psychoeducation, Recreational therapy.   Physician Treatment Plan for Primary Diagnosis: Paranoid schizophrenia (HCC) Long Term Goal(s): Improvement in symptoms so as ready for discharge   Short Term Goals: Ability to identify changes in lifestyle to reduce recurrence of condition will improve Ability to verbalize feelings will improve Ability to disclose  and discuss suicidal ideas Ability to demonstrate self-control will improve Ability to identify and develop effective coping behaviors will improve Ability to maintain clinical measurements within normal limits will improve Compliance with prescribed medications will improve Ability to identify triggers associated with substance abuse/mental health issues will improve  Medication Management: Evaluate patient's response, side effects, and tolerance of medication regimen.  Therapeutic Interventions: 1 to 1 sessions, Unit Group sessions and Medication administration.  Evaluation of Outcomes: Not Progressing  Physician Treatment Plan for Secondary Diagnosis: Principal Problem:   Paranoid schizophrenia (HCC)  Long Term Goal(s): Improvement in symptoms so as ready for discharge   Short Term Goals:  Ability to identify changes in lifestyle to reduce recurrence of condition will improve Ability to verbalize feelings will improve Ability to disclose and discuss suicidal ideas Ability to demonstrate self-control will improve Ability to identify and develop effective coping behaviors will improve Ability to maintain clinical measurements within normal limits will improve Compliance with prescribed medications will improve Ability to identify triggers associated with substance abuse/mental health issues will improve     Medication Management: Evaluate patient's response, side effects, and tolerance of medication regimen.  Therapeutic Interventions: 1 to 1 sessions, Unit Group sessions and Medication administration.  Evaluation of Outcomes: Not Progressing   RN Treatment Plan for Primary Diagnosis: Paranoid schizophrenia (HCC) Long Term Goal(s): Knowledge of disease and therapeutic regimen to maintain health will improve  Short Term Goals: Ability to remain free from injury will improve, Ability to verbalize frustration and anger appropriately will improve, Ability to demonstrate self-control, Ability to participate in decision making will improve, Ability to verbalize feelings will improve, Ability to disclose and discuss suicidal ideas, Ability to identify and develop effective coping behaviors will improve, and Compliance with prescribed medications will improve  Medication Management: RN will administer medications as ordered by provider, will assess and evaluate patient's response and provide education to patient for prescribed medication. RN will report any adverse and/or side effects to prescribing provider.  Therapeutic Interventions: 1 on 1 counseling sessions, Psychoeducation, Medication administration, Evaluate responses to treatment, Monitor vital signs and CBGs as ordered, Perform/monitor CIWA, COWS, AIMS and Fall Risk screenings as ordered, Perform wound care treatments as  ordered.  Evaluation of Outcomes: Not Progressing   LCSW Treatment Plan for Primary Diagnosis: Paranoid schizophrenia (HCC) Long Term Goal(s): Safe transition to appropriate next level of care at discharge, Engage patient in therapeutic group addressing interpersonal concerns.  Short Term Goals: Engage patient in aftercare planning with referrals and resources, Increase social support, Increase ability to appropriately verbalize feelings, Increase emotional regulation, Facilitate acceptance of mental health diagnosis and concerns, Facilitate patient progression through stages of change regarding substance use diagnoses and concerns, Identify triggers associated with mental health/substance abuse issues, and Increase skills for wellness and recovery  Therapeutic Interventions: Assess for all discharge needs, 1 to 1 time with Social worker, Explore available resources and support systems, Assess for adequacy in community support network, Educate family and significant other(s) on suicide prevention, Complete Psychosocial Assessment, Interpersonal group therapy.  Evaluation of Outcomes: Not Progressing   Progress in Treatment: Attending groups: attended some groups Participating in groups: Yes. Taking medication as prescribed: Yes. Toleration medication: Yes. Family/Significant other contact made: No, will contact:  Mother, [name] [phone] Patient understands diagnosis: Yes. Discussing patient identified problems/goals with staff: Yes. Medical problems stabilized or resolved: Yes. Denies suicidal/homicidal ideation: Yes. Issues/concerns per patient self-inventory: No.  New problem(s) identified:  No  New Short Term/Long Term Goal(s):  medication stabilization, elimination of SI thoughts, development of comprehensive mental wellness plan.    Patient Goals:  I want to work on my mental health, medications and therapy.  Discharge Plan or Barriers:  Patient recently  admitted. CSW will continue to follow and assess for appropriate referrals and possible discharge planning.   Reason for Continuation of Hospitalization: Hallucinations Medication stabilization  Estimated Length of Stay:  5 - 7 days  Last 3 [place] Suicide Severity Risk Score: Flowsheet Row [facility] (Current[facility] 03[facility]08/2024 in [facility]  C-SSRS RISK CATEGORY No Risk No Risk    Last PHQ 2/9 Scores:     No data to display          Scribe for Treatment Team: Antwoin Lackey O Jacobey Gura, LCSWA 03/11/2024 4:50 PM

## 2024-03-11 NOTE — Group Note (Signed)
 Date:  03/11/2024 Time:  9:01 PM  Group Topic/Focus:  Wrap-Up Group:   The focus of this group is to help patients review their daily goal of treatment and discuss progress on daily workbooks.   Additional Comments:  Pt was invited to attend group however the Pt declined.   Jeremy Taylor [place] 03/11/2024, 9:01 PM

## 2024-03-11 NOTE — Plan of Care (Signed)
   Problem: Education: Goal: Knowledge of [place] [facility] Education information/materials will improve Outcome: Progressing Goal: Emotional status will improve Outcome: Progressing Goal: Mental status will improve Outcome: Progressing

## 2024-03-11 NOTE — Plan of Care (Signed)
   Problem: Education: Goal: Emotional status will improve Outcome: Not Progressing Goal: Mental status will improve Outcome: Not Progressing   Problem: Activity: Goal: Interest or engagement in activities will improve Outcome: Not Progressing

## 2024-03-11 NOTE — Progress Notes (Signed)
 [facility] MD Progress Note  03/11/2024 7:19 PM Jeremy Taylor  MRN:  [id number]  Principal Problem: Paranoid schizophrenia (HCC) Diagnosis: Principal Problem:   Paranoid schizophrenia (HCC)  Reason for admission: : Jeremy Taylor is a 24 year old African-American male with prior psychiatric diagnosis significant for paranoid schizophrenia, cannabis dependence, who presents involuntarily to [facility] from [facility] health ED at [facility] worsening memory loss, psychiatric evaluation and hallucinations in the context of cannabis dependence.  BAL less than 15, UDS positive for marijuana.   24-hour chart review:  Case discussed in interdisciplinary team meeting. Vital signs reviewed: BP 112/73, HR 72 PRNs required: Hydroxyzine  x 1 for anxiety Agitation protocol required: None  Today's assessment notes: Unable to perform full and adequate psychiatric assessment due to thought blocking and selective mutism. On assessment today, the pt reports that their mood is less depressed by nodding his head.  He presents alert and oriented to name only.  Other nursing staff report patient responding to them, indicating selective mutism. Labs ordered of vitamin D  25-hydroxy: 8.6, vitamin D  50,000 international units ordered q. 7 days x 7 doses for supplement.  TSH: 0.639, within normal limits.  Hemoglobin A1c: 5.5 within normal limit.  Will continue to monitor patient for safety.  Nursing staff report patient is being compliant with his psychotropic medications. Reports that anxiety is at manageable level As per nursing note documentation, patient slept for 6.5 hours last night.   Appetite is is good as per Nurses report Concentration is fair Energy level is adequate Denies suicidal thoughts.  Denies suicidal intent and plan.  Denies having any HI.  Denies having psychotic symptoms.   Denies having side effects to current psychiatric medications.   We discussed changes to  current medication regimen, including: None  Psychosocial interventions: -Motivational interviewing   -Daily medication management with psychiatry  -Medication education regarding risks/benefits and alternatives  -Bedside psychotherapy as indicated   -Patient will be encouraged to participate and engage with group therapy   -Appreciate SW assistance in coordinating safe disposition   -Anticipated LOS 5-7 days    Total Time spent with patient: 45 minutes  Past Psychiatric History:  Previous Psych Diagnoses: Paranoid schizophrenia and marijuana dependence Prior inpatient treatment: Unable to assess (UTA) Current/prior outpatient treatment: UTA Prior rehab hx: UTA Psychotherapy hx: UTA History of suicide: UTA History of homicide or aggression: UTA Psychiatric medication history: Patient has been on trial Zoloft , Risperdal , and trazodone  Psychiatric medication compliance history: UTA Neuromodulation history: UTA Current Psychiatrist: UTA Current therapist: UTA   Past Medical History: History reviewed. No pertinent past medical history. History reviewed. No pertinent surgical history. Family History: History reviewed. No pertinent family history. Family Psychiatric  History: See H&P Social History:  Social History   Substance and Sexual Activity  Alcohol Use None     Social History   Substance and Sexual Activity  Drug Use Not on file    Social History   Socioeconomic History   Marital status: Single    Spouse name: Not on file   Number of children: Not on file   Years of education: Not on file   Highest education level: Not on file  Occupational History   Not on file  Tobacco Use   Smoking status: Never   Smokeless tobacco: Never  Substance and Sexual Activity   Alcohol use: Not on file   Drug use: Not on file   Sexual activity: Not on file  Other Topics Concern  Not on file  Social History Narrative   Not on file   Social Drivers of Health   Tobacco Use:  Low Risk (03/09/2024)   Patient History    Smoking Tobacco Use: Never    Smokeless Tobacco Use: Never    Passive Exposure: Not on file  Financial Resource Strain: Not on file  Food Insecurity: Patient Declined (03/09/2024)   Epic    Worried About Programme Researcher, Broadcasting/film/video in the Last Year: Patient declined    Barista in the Last Year: Patient declined  Transportation Needs: Patient Declined (03/09/2024)   Epic    Lack of Transportation (Medical): Patient declined    Lack of Transportation (Non-Medical): Patient declined  Physical Activity: Not on file  Stress: Not on file  Social Connections: Not on file  Depression (PHQ2-9): Not on file  Alcohol Screen: Low Risk (03/09/2024)   Alcohol Screen    Last Alcohol Screening Score (AUDIT): 0  Housing: Unknown (03/09/2024)   Epic    Unable to Pay for Housing in the Last Year: Patient declined    Number of Times Moved in the Last Year: 0    Homeless in the Last Year: Patient declined  Utilities: Not At Risk (03/09/2024)   Epic    Threatened with loss of utilities: No  Health Literacy: Not on file   Additional Social History:   Sleep: Good Estimated Sleeping Duration (Last 24 Hours): 5.25-6.25 hours  Appetite:  Good  Current Medications: Current Facility-Administered Medications  Medication Dose Route Frequency Provider Last Rate Last Admin   acetaminophen  (TYLENOL ) tablet 650 mg  650 mg Oral Q6H PRN Mannie Jerel PARAS, NP       alum & mag hydroxide-simeth (MAALOX/MYLANTA) 200-200-20 MG/5ML suspension 30 mL  30 mL Oral Q4H PRN Mannie Jerel PARAS, NP       haloperidol  (HALDOL ) tablet 5 mg  5 mg Oral TID PRN Mannie Jerel PARAS, NP       And   diphenhydrAMINE  (BENADRYL ) capsule 50 mg  50 mg Oral TID PRN Mannie Jerel PARAS, NP       haloperidol  lactate (HALDOL ) injection 10 mg  10 mg Intramuscular TID PRN Mannie Jerel PARAS, NP       And   diphenhydrAMINE  (BENADRYL ) injection 50 mg  50 mg Intramuscular TID PRN Mannie Jerel PARAS, NP        And   LORazepam  (ATIVAN ) injection 2 mg  2 mg Intramuscular TID PRN Mannie Jerel PARAS, NP       haloperidol  lactate (HALDOL ) injection 5 mg  5 mg Intramuscular TID PRN Mannie Jerel PARAS, NP       And   diphenhydrAMINE  (BENADRYL ) injection 50 mg  50 mg Intramuscular TID PRN Mannie Jerel PARAS, NP       And   LORazepam  (ATIVAN ) injection 2 mg  2 mg Intramuscular TID PRN Mannie Jerel PARAS, NP       hydrOXYzine  (ATARAX ) tablet 25 mg  25 mg Oral TID PRN Maico Mulvehill C, FNP   25 mg at 03/11/24 0848   magnesium  hydroxide (MILK OF MAGNESIA) suspension 30 mL  30 mL Oral Daily PRN Mannie Jerel PARAS, NP   30 mL at 03/09/24 2202   nicotine  (NICODERM CQ  - dosed in mg/24 hours) patch 21 mg  21 mg Transdermal Q0600 Mannie Jerel PARAS, NP   21 mg at 03/11/24 0849   risperiDONE  (RISPERDAL ) tablet 2 mg  2 mg Oral Daily Mannie Jerel PARAS, NP  2 mg at 03/11/24 9149   sertraline  (ZOLOFT ) tablet 25 mg  25 mg Oral Daily Jerrie Schussler C, FNP   25 mg at 03/11/24 0848   traZODone  (DESYREL ) tablet 50 mg  50 mg Oral QHS PRN Bethannie Iglehart C, FNP       Lab Results:  Results for orders placed or performed during the hospital encounter of 03/09/24 (from the past 48 hours)  Lipid panel     Status: Abnormal   Collection Time: 03/10/24  6:24 AM  Result Value Ref Range   Cholesterol 227 (H) 0 - 200 mg/dL    Comment:        ATP III CLASSIFICATION:  <200     mg/dL   Desirable  799-760  mg/dL   Borderline High  >=759    mg/dL   High           Triglycerides 57 <150 mg/dL   HDL 34 (L) >59 mg/dL   Total CHOL/HDL Ratio 6.8 RATIO   VLDL 11 0 - 40 mg/dL   LDL Cholesterol 817 (H) 0 - 99 mg/dL    Comment:        Total Cholesterol/HDL:CHD Risk Coronary Heart Disease Risk Table                     Men   Women  1/2 Average Risk   3.4   3.3  Average Risk       5.0   4.4  2 X Average Risk   9.6   7.1  3 X Average Risk  23.4   11.0        Use the calculated Patient Ratio above and the CHD Risk Table to determine the patient's CHD Risk.         ATP III CLASSIFICATION (LDL):  <100     mg/dL   Optimal  899-870  mg/dL   Near or Above                    Optimal  130-159  mg/dL   Borderline  839-810  mg/dL   High  >809     mg/dL   Very High Performed at [facility], [address], [place], [place] [address]   Hemoglobin A1c     Status: None   Collection Time: 03/10/24  6:24 AM  Result Value Ref Range   Hgb A1c MFr Bld 5.5 4.8 - 5.6 %    Comment: (NOTE) Diagnosis of Diabetes The following HbA1c ranges recommended by the American Diabetes Association (ADA) may be used as an aid in the diagnosis of diabetes mellitus.  Hemoglobin             Suggested A1C NGSP%              Diagnosis  <5.7                   Non Diabetic  5.7-6.4                Pre-Diabetic  >6.4                   Diabetic  <7.0                   Glycemic control for                       adults with diabetes.     Mean Plasma  Glucose 111.15 mg/dL    Comment: Performed at [facility], [address], [place], [place] [address]  VITAMIN D  25 Hydroxy (Vit-D Deficiency, Fractures)     Status: Abnormal   Collection Time: 03/11/24  8:25 AM  Result Value Ref Range   Vit D, 25-Hydroxy 8.6 (L) 30 - 100 ng/mL    Comment: (NOTE) Vitamin D  deficiency has been defined by the Institute of Medicine  and an Endocrine Society practice guideline as a level of serum 25-OH  vitamin D  less than 20 ng/mL (1,2). The Endocrine Society went on to  further define vitamin D  insufficiency as a level between 21 and 29  ng/mL (2).  1. IOM (Institute of Medicine). 2010. Dietary reference intakes for  calcium and D. [place]: [employer]. 2. Holick MF, Binkley [place], Bischoff-Ferrari HA, et al. Evaluation,  treatment, and prevention of vitamin D  deficiency: an Endocrine  Society clinical practice guideline, JCEM. 2011 Jul; 96(7): 1911-30.  Performed at [facility], [address] [place], [place] [address]   TSH      Status: None   Collection Time: 03/11/24  8:25 AM  Result Value Ref Range   TSH 0.639 0.350 - 4.500 uIU/mL    Comment: Performed at [facility], [address], [place], [place] [address]   Blood Alcohol level:  Lab Results  Component Value Date   [facility] <15 03/08/2024   Metabolic Disorder Labs: Lab Results  Component Value Date   HGBA1C 5.5 03/10/2024   MPG 111.15 03/10/2024   No results found for: PROLACTIN Lab Results  Component Value Date   CHOL 227 (H) 03/10/2024   TRIG 57 03/10/2024   HDL 34 (L) 03/10/2024   CHOLHDL 6.8 03/10/2024   VLDL 11 03/10/2024   LDLCALC 182 (H) 03/10/2024   Physical Findings: AIMS:  ,  ,  ,  ,  ,  ,   CIWA:    COWS:     Musculoskeletal: Strength & Muscle Tone: within normal limits Gait & Station: normal Patient leans: N/A  Psychiatric Specialty Exam:  Presentation  General Appearance:  Bizarre  Eye Contact: Fleeting  Speech: Blocked  Speech Volume: -- (Blocked and low)  Handedness: Right  Mood and Affect  Mood: Dysphoric  Affect: Congruent  Thought Process  Thought Processes: Disorganized; Irrevelant  Descriptions of Associations:Loose  Orientation:Other (comment)  Thought Content:Paranoid Ideation; Delusions  History of Schizophrenia/Schizoaffective disorder:Yes  Duration of Psychotic Symptoms:Greater than six months  Hallucinations:Hallucinations: -- (UTA)  Ideas of Reference:Paranoia; Delusions  Suicidal Thoughts:Suicidal Thoughts: -- (UTA)  Homicidal Thoughts:Homicidal Thoughts: -- (UTA)  Sensorium  Memory: Immediate Poor; Recent Poor  Judgment: Poor  Insight: Poor  Executive Functions  Concentration: Poor  Attention Span: Poor  Recall: Poor  Fund of Knowledge: Poor  Language: Poor  Psychomotor Activity  Psychomotor Activity: Psychomotor Activity: Decreased  Assets  Assets: -- (UTA)  Sleep  Sleep: Sleep: Good Number of Hours of Sleep:  6.5  Physical Exam: Physical Exam Vitals and nursing note reviewed.  Constitutional:      Appearance: He is normal weight.  HENT:     Head: Normocephalic.     Right Ear: External ear normal.     Left Ear: External ear normal.     Nose: Nose normal.     Mouth/Throat:     Mouth: Mucous membranes are moist.     Pharynx: Oropharynx is clear.  Eyes:     Extraocular Movements: Extraocular movements intact.  Cardiovascular:     Rate and Rhythm: Normal rate.     Pulses: Normal pulses.  Pulmonary:     Effort: Pulmonary effort is normal.  Musculoskeletal:        General: Normal range of motion.     Cervical back: Normal range of motion.  Skin:    General: Skin is dry.  Neurological:     Mental Status: He is alert and oriented to person, place, and time.  Psychiatric:     Comments: Patient appears dysphoric with thought blocking and selective mutism    Review of Systems  Constitutional:  Negative for chills and fever.  HENT:  Negative for sore throat.   Eyes:  Negative for blurred vision.  Respiratory:  Negative for cough, sputum production, shortness of breath and wheezing.   Cardiovascular:  Negative for chest pain.  Gastrointestinal:  Negative for abdominal pain, constipation, diarrhea, heartburn, nausea and vomiting.  Genitourinary:  Negative for dysuria.  Musculoskeletal:  Negative for falls.  Skin:  Negative for itching and rash.  Neurological:  Negative for dizziness and headaches.  Endo/Heme/Allergies: Negative.   Psychiatric/Behavioral:  Positive for hallucinations (As per chart review ongoing auditory and visual hallucinations). Negative for depression, substance abuse and suicidal ideas. The patient is nervous/anxious. The patient does not have insomnia.    Blood pressure 112/73, pulse 72, temperature 98.2 F (36.8 C), temperature source Oral, resp. rate 18, height 5' 9 (1.753 m), weight 62.6 kg, SpO2 100%. Body mass index is 20.38 kg/m.  Treatment Plan  Summary: Daily contact with patient to assess and evaluate symptoms and progress in treatment and Medication management   Observation Level/Precautions:  15 minute checks  Laboratory:   CBC: Within normal limits Chemistry Profile: Albumin 5.2, glucose 156, otherwise normal Folic Acid:  GGT: N/A Lipid panel: Cholesterol 227, HDL 34, LDL 182.  Otherwise normal TSH: Ordered: 0.639, within normal limits HCG: N/A Vitamin D  25-hydroxy: Ordered: 8.6 low, replace with vitamin D  50,000 units q. 7 days x 7 doses UDS: Positive for marijuana UA: Hazy, ketones 20, protein 30, specific gravity 1.035, otherwise normal Vitamin B-12: N/A Hemoglobin A1c 5.5, within normal limits EKG: Normal sinus rhythm with sinus arrhythmia, ventricular rate 63, QT/QTc 390/399  Psychotherapy: Therapeutic milieu  Medications: See MAR  Consultations: N/A  Discharge Concerns: Safety  Estimated LOS: 5 to 7 days  Other:      Assessment: Minor D Rusnak is a 24 year old African-American male with prior psychiatric diagnosis significant for paranoid schizophrenia, cannabis dependence, who presents voluntarily to [facility] from [facility] health ED at [facility] worsening memory loss, psychiatric evaluation and hallucinations in the context of cannabis dependence.  BAL less than 15, UDS positive for marijuana.    Physician Treatment Plan for Primary Diagnosis: Paranoid schizophrenia (HCC)   Plans: Medications: Resume home medications --Continue Risperdal  tablets 2 mg p.o. q. nightly for schizophrenia --Continue Zoloft  tablet 25 mg p.o. daily for depression and anxiety --Continue trazodone  tablet 50 mg p.o. daily for insomnia --Continue hydroxyzine  tablet 25 mg p.o. 3 times daily as needed for anxiety  Continue [facility] agitation protocol as recommended   Medication for other medical problems:  -- Initiate vitamin D  50,000 units q. 7 days x 7 doses for vitamin D  deficiency   Other PRN  Medications -Acetaminophen  650 mg every 6 as needed/mild pain -Maalox 30 mL oral every 4 as needed/digestion -Magnesium  hydroxide 30 mL daily as needed/mild constipation   --The risks/benefits/side-effects/alternatives to this medication were discussed  in detail with the patient and time was given for questions. The patient consents to medication trial.  -- Metabolic profile and EKG monitoring obtained while on an atypical antipsychotic (BMI: Lipid Panel: HbgA1c: QTc:)  -- Encouraged patient to participate in unit milieu and in scheduled group therapies   Safety and Monitoring: Voluntary admission to inpatient psychiatric unit for safety, stabilization and treatment Daily contact with patient to assess and evaluate symptoms and progress in treatment Patient's case to be discussed in multi-disciplinary team meeting Observation Level : q15 minute checks Vital signs: q12 hours Precautions: suicide, but pt currently verbally contracts for safety on unit    Discharge Planning: Social work and case management to assist with discharge planning and identification of hospital follow-up needs prior to discharge Estimated LOS: 5-7 days Discharge Concerns: Need to establish Safety plan; Medication compliance and effectiveness Discharge Goals: Return home with outpatient referrals for mental health follow-up including medication management/psychotherapy.   Long Term Goal(s): Improvement in symptoms so as ready for discharge   Short Term Goals: Ability to identify changes in lifestyle to reduce recurrence of condition will improve, Ability to verbalize feelings will improve, Ability to disclose and discuss suicidal ideas, Ability to demonstrate self-control will improve, Ability to identify and develop effective coping behaviors will improve, Ability to maintain clinical measurements within normal limits will improve, Compliance with prescribed medications will improve, and Ability to identify triggers  associated with substance abuse/mental health issues will improve   Physician Treatment Plan for Secondary Diagnosis: Principal Problem:   Paranoid schizophrenia (HCC)   I certify that inpatient services furnished can reasonably be expected to improve the patient's condition.     Dezirae Service C Parks Czajkowski, FNP Ellouise JAYSON Azure, FNP 03/11/2024, 7:19 PM

## 2024-03-11 NOTE — Group Note (Signed)
 Date:  03/11/2024 Time:  5:01 PM  Group Topic/Focus:  Self Care:   The focus of this group is to help patients understand the importance of self-care and sleep hygiene in order to improve or restore emotional, physical, spiritual, interpersonal, and financial health.    Additional Comments:  patient did not attend   Jeremy Taylor 03/11/2024, 5:01 PM

## 2024-03-12 DIAGNOSIS — F200 Paranoid schizophrenia: Secondary | ICD-10-CM | POA: Diagnosis not present

## 2024-03-12 MED ADMIN — Trazodone HCl Tab 50 MG: 50 mg | ORAL | NDC 00904686861

## 2024-03-12 MED ADMIN — Risperidone Tab 2 MG: 2 mg | ORAL | NDC 00904736361

## 2024-03-12 MED ADMIN — Sertraline HCl Tab 25 MG: 25 mg | ORAL | NDC 60687023111

## 2024-03-12 MED FILL — Risperidone Tab 2 MG: 2.0000 mg | ORAL | Qty: 1 | Status: AC

## 2024-03-12 MED FILL — Risperidone Tab 1 MG: 1.0000 mg | ORAL | Qty: 1 | Status: AC

## 2024-03-12 NOTE — Group Note (Signed)
 Date:  03/12/2024 Time:  3:22 PM  Group Topic/Focus: goals and orientation Goals Group:   The focus of this group is to help patients establish daily goals to achieve during treatment and discuss how the patient can incorporate goal setting into their daily lives to aide in recovery. Orientation:   The focus of this group is to educate the patient on the purpose and policies of crisis stabilization and provide a format to answer questions about their admission.  The group details unit policies and expectations of patients while admitted.    Participation Level:  Did Not Attend   Dolores CHRISTELLA Fredericks 03/12/2024, 3:22 PM

## 2024-03-12 NOTE — Group Note (Signed)
 Date:  03/12/2024 Time:  9:02 PM  Group Topic/Focus:  Wrap-Up Group:   The focus of this group is to help patients review their daily goal of treatment and discuss progress on daily workbooks.    Participation Level:  Did Not Attend  Participation Quality:  None  Affect:  n/a  Cognitive:  n/a  Insight: None  Engagement in Group:  None  Modes of Intervention:  none  Additional Comments:   Pt was encouraged but refused to attend wrap up group  Tarika Mckethan A Shayanne Gomm 03/12/2024, 9:02 PM

## 2024-03-12 NOTE — Group Note (Signed)
 Date:  03/12/2024 Time:  6:37 PM  Group Topic/Focus:  Dimensions of Wellness:   The focus of this group is to introduce the topic of wellness and discuss the role each dimension of wellness plays in total health.  This Group focused on building healthy boundaries.    Participation Level:  Did Not Attend    Annalee Larch 03/12/2024, 6:37 PM

## 2024-03-12 NOTE — Progress Notes (Signed)
(  Sleep Hours) -7.75 as of 0530 (Any PRNs that were needed, meds refused, or side effects to meds)- refused vit.D  (Any disturbances and when (visitation, over night)-none (Concerns raised by the patient)- none (SI/HI/AVH)- denies all  Pt was in bed at beginning of shift. He refused to get up for group and refused to get up for meds.

## 2024-03-12 NOTE — Progress Notes (Addendum)
 [facility] MD Progress Note  03/12/2024 12:55 PM Jeremy Taylor  MRN:  [id number]  Principal Problem: Paranoid schizophrenia (HCC) Diagnosis: Principal Problem:   Paranoid schizophrenia (HCC)  Reason for admission: : Jeremy Taylor is a 24 year old African-American male with prior psychiatric diagnosis significant for paranoid schizophrenia, cannabis dependence, who presents involuntarily to [facility] from [facility] health ED at [facility] worsening memory loss, psychiatric evaluation and hallucinations in the context of cannabis dependence.  BAL less than 15, UDS positive for marijuana.   24-hour chart review:  Case discussed in interdisciplinary team meeting. Vital signs reviewed: BP 118/82, HR 59 PRNs required: MoM for constipation Agitation protocol required: None  Today's assessment notes:  Patient evaluated in milieu. Denies SI/HI/AH. Endorses VH but unable to specify exactly what he is seeing. Reports eating and sleeping fine. Significant thought blocking and inappropriate affect during assessment. Slee  Denies having side effects to current psychiatric medications.   We discussed changes to current medication regimen, including: None  Psychosocial interventions: -Motivational interviewing   -Daily medication management with psychiatry  -Medication education regarding risks/benefits and alternatives  -Bedside psychotherapy as indicated   -Patient will be encouraged to participate and engage with group therapy   -Appreciate SW assistance in coordinating safe disposition   -Anticipated LOS 5-7 days    Total Time spent with patient: 45 minutes  Past Psychiatric History:  Previous Psych Diagnoses: Paranoid schizophrenia and marijuana dependence Prior inpatient treatment: Unable to assess (UTA) Current/prior outpatient treatment: UTA Prior rehab hx: UTA Psychotherapy hx: UTA History of suicide: UTA History of homicide or aggression:  UTA Psychiatric medication history: Patient has been on trial Zoloft , Risperdal , and trazodone  Psychiatric medication compliance history: UTA Neuromodulation history: UTA Current Psychiatrist: UTA Current therapist: UTA   Past Medical History: History reviewed. No pertinent past medical history. History reviewed. No pertinent surgical history. Family History: History reviewed. No pertinent family history. Family Psychiatric  History: See H&P Social History:  Social History   Substance and Sexual Activity  Alcohol Use None     Social History   Substance and Sexual Activity  Drug Use Not on file    Social History   Socioeconomic History   Marital status: Single    Spouse name: Not on file   Number of children: Not on file   Years of education: Not on file   Highest education level: Not on file  Occupational History   Not on file  Tobacco Use   Smoking status: Never   Smokeless tobacco: Never  Substance and Sexual Activity   Alcohol use: Not on file   Drug use: Not on file   Sexual activity: Not on file  Other Topics Concern   Not on file  Social History Narrative   Not on file   Social Drivers of Health   Tobacco Use: Low Risk (03/09/2024)   Patient History    Smoking Tobacco Use: Never    Smokeless Tobacco Use: Never    Passive Exposure: Not on file  Financial Resource Strain: Not on file  Food Insecurity: Patient Declined (03/09/2024)   Epic    Worried About Programme Researcher, Broadcasting/film/video in the Last Year: Patient declined    Barista in the Last Year: Patient declined  Transportation Needs: Patient Declined (03/09/2024)   Epic    Lack of Transportation (Medical): Patient declined    Lack of Transportation (Non-Medical): Patient declined  Physical Activity: Not on file  Stress: Not  on file  Social Connections: Not on file  Depression [phone]): Not on file  Alcohol Screen: Low Risk (03/09/2024)   Alcohol Screen    Last Alcohol Screening Score (AUDIT): 0   Housing: Unknown (03/09/2024)   Epic    Unable to Pay for Housing in the Last Year: Patient declined    Number of Times Moved in the Last Year: 0    Homeless in the Last Year: Patient declined  Utilities: Not At Risk (03/09/2024)   Epic    Threatened with loss of utilities: No  Health Literacy: Not on file   Additional Social History:   Sleep: Good Estimated Sleeping Duration (Last 24 Hours): 6.00-7.75 hours  Appetite:  Good  Current Medications: Current Facility-Administered Medications  Medication Dose Route Frequency Provider Last Rate Last Admin   acetaminophen  (TYLENOL ) tablet 650 mg  650 mg Oral Q6H PRN Mannie Jerel PARAS, NP   650 mg at 03/12/24 0917   alum & mag hydroxide-simeth (MAALOX/MYLANTA) 200-200-20 MG/5ML suspension 30 mL  30 mL Oral Q4H PRN Mannie Jerel PARAS, NP       haloperidol  (HALDOL ) tablet 5 mg  5 mg Oral TID PRN Mannie Jerel PARAS, NP       And   diphenhydrAMINE  (BENADRYL ) capsule 50 mg  50 mg Oral TID PRN Mannie Jerel PARAS, NP       haloperidol  lactate (HALDOL ) injection 10 mg  10 mg Intramuscular TID PRN Mannie Jerel PARAS, NP       And   diphenhydrAMINE  (BENADRYL ) injection 50 mg  50 mg Intramuscular TID PRN Mannie Jerel PARAS, NP       And   LORazepam  (ATIVAN ) injection 2 mg  2 mg Intramuscular TID PRN Mannie Jerel PARAS, NP       haloperidol  lactate (HALDOL ) injection 5 mg  5 mg Intramuscular TID PRN Mannie Jerel PARAS, NP       And   diphenhydrAMINE  (BENADRYL ) injection 50 mg  50 mg Intramuscular TID PRN Mannie Jerel PARAS, NP       And   LORazepam  (ATIVAN ) injection 2 mg  2 mg Intramuscular TID PRN Mannie Jerel PARAS, NP       hydrOXYzine  (ATARAX ) tablet 25 mg  25 mg Oral TID PRN Ntuen, Tina C, FNP   25 mg at 03/11/24 0848   magnesium  hydroxide (MILK OF MAGNESIA) suspension 30 mL  30 mL Oral Daily PRN Mannie Jerel PARAS, NP   30 mL at 03/12/24 9082   nicotine  (NICODERM CQ  - dosed in mg/24 hours) patch 21 mg  21 mg Transdermal Q0600 Mannie Jerel PARAS, NP   21 mg at  03/12/24 0920   risperiDONE  (RISPERDAL ) tablet 2 mg  2 mg Oral Daily Mannie Jerel PARAS, NP   2 mg at 03/12/24 9084   sertraline  (ZOLOFT ) tablet 25 mg  25 mg Oral Daily Ntuen, Tina C, FNP   25 mg at 03/12/24 0915   traZODone  (DESYREL ) tablet 50 mg  50 mg Oral QHS PRN Ntuen, Tina C, FNP       Vitamin D  (Ergocalciferol ) (DRISDOL ) 1.25 MG (50000 UNIT) capsule 50,000 Units  50,000 Units Oral Q7 days Ntuen, Ellouise BROCKS, FNP       Lab Results:  Results for orders placed or performed during the hospital encounter of 03/09/24 (from the past 48 hours)  VITAMIN D  25 Hydroxy (Vit-D Deficiency, Fractures)     Status: Abnormal   Collection Time: 03/11/24  8:25 AM  Result Value Ref Range   Vit  D, 25-Hydroxy 8.6 (L) 30 - 100 ng/mL    Comment: (NOTE) Vitamin D  deficiency has been defined by the Institute of Medicine  and an Endocrine Society practice guideline as a level of serum 25-OH  vitamin D  less than 20 ng/mL (1,2). The Endocrine Society went on to  further define vitamin D  insufficiency as a level between 21 and 29  ng/mL (2).  1. IOM (Institute of Medicine). 2010. Dietary reference intakes for  calcium and D. [place]: [employer]. 2. Holick MF, Binkley Force, Bischoff-Ferrari HA, et al. Evaluation,  treatment, and prevention of vitamin D  deficiency: an Endocrine  Society clinical practice guideline, JCEM. 2011 Jul; 96(7): 1911-30.  Performed at [facility], [address] [place], [place] [address]   TSH     Status: None   Collection Time: 03/11/24  8:25 AM  Result Value Ref Range   TSH 0.639 0.350 - 4.500 uIU/mL    Comment: Performed at [facility], [address], [place], [place] [address]   Blood Alcohol level:  Lab Results  Component Value Date   [facility] <15 03/08/2024   Metabolic Disorder Labs: Lab Results  Component Value Date   HGBA1C 5.5 03/10/2024   MPG 111.15 03/10/2024   No results found for: PROLACTIN Lab Results  Component  Value Date   CHOL 227 (H) 03/10/2024   TRIG 57 03/10/2024   HDL 34 (L) 03/10/2024   CHOLHDL 6.8 03/10/2024   VLDL 11 03/10/2024   LDLCALC 182 (H) 03/10/2024   Physical Findings: AIMS:  ,  ,  ,  ,  ,  ,   CIWA:    COWS:     Musculoskeletal: Strength & Muscle Tone: within normal limits Gait & Station: normal Patient leans: N/A  Psychiatric Specialty Exam:  Presentation  General Appearance:  Bizarre  Eye Contact: Fleeting  Speech: Blocked  Speech Volume: -- (Blocked and low)  Handedness: Right  Mood and Affect  Mood: Dysphoric  Affect: Congruent  Thought Process  Thought Processes: Disorganized; Irrevelant  Descriptions of Associations:Loose  Orientation:Other (comment)  Thought Content:Paranoid Ideation; Delusions  History of Schizophrenia/Schizoaffective disorder:Yes  Duration of Psychotic Symptoms:Greater than six months  Hallucinations:Hallucinations: -- (UTA)  Ideas of Reference:Paranoia; Delusions  Suicidal Thoughts:Suicidal Thoughts: -- (UTA)  Homicidal Thoughts:Homicidal Thoughts: -- (UTA)  Sensorium  Memory: Immediate Poor; Recent Poor  Judgment: Poor  Insight: Poor  Executive Functions  Concentration: Poor  Attention Span: Poor  Recall: Poor  Fund of Knowledge: Poor  Language: Poor  Psychomotor Activity  Psychomotor Activity: Psychomotor Activity: Decreased  Assets  Assets: -- (UTA)  Sleep  Sleep: Sleep: Good Number of Hours of Sleep: 6.5  Physical Exam: Physical Exam Vitals and nursing note reviewed.  Constitutional:      Appearance: He is normal weight.  HENT:     Head: Normocephalic.     Right Ear: External ear normal.     Left Ear: External ear normal.     Nose: Nose normal.     Mouth/Throat:     Mouth: Mucous membranes are moist.     Pharynx: Oropharynx is clear.  Eyes:     Extraocular Movements: Extraocular movements intact.  Cardiovascular:     Rate and Rhythm: Normal rate.      Pulses: Normal pulses.  Pulmonary:     Effort: Pulmonary effort is normal.  Musculoskeletal:        General: Normal range of motion.     Cervical back: Normal  range of motion.  Skin:    General: Skin is dry.  Neurological:     Mental Status: He is alert and oriented to person, place, and time.  Psychiatric:     Comments: Patient appears dysphoric with thought blocking and selective mutism    Review of Systems  Constitutional:  Negative for chills and fever.  HENT:  Negative for sore throat.   Eyes:  Negative for blurred vision.  Respiratory:  Negative for cough, sputum production, shortness of breath and wheezing.   Cardiovascular:  Negative for chest pain.  Gastrointestinal:  Negative for abdominal pain, constipation, diarrhea, heartburn, nausea and vomiting.  Genitourinary:  Negative for dysuria.  Musculoskeletal:  Negative for falls.  Skin:  Negative for itching and rash.  Neurological:  Negative for dizziness and headaches.  Endo/Heme/Allergies: Negative.   Psychiatric/Behavioral:  Positive for hallucinations (As per chart review ongoing auditory and visual hallucinations). Negative for depression, substance abuse and suicidal ideas. The patient is nervous/anxious. The patient does not have insomnia.    Blood pressure 118/82, pulse (!) 59, temperature 98.1 F (36.7 C), temperature source Oral, resp. rate 18, height 5' 9 (1.753 m), weight 62.6 kg, SpO2 99%. Body mass index is 20.38 kg/m.  Treatment Plan Summary: Daily contact with patient to assess and evaluate symptoms and progress in treatment and Medication management   Observation Level/Precautions:  15 minute checks  Laboratory:   CBC: Within normal limits Chemistry Profile: Albumin 5.2, glucose 156, otherwise normal Folic Acid:  GGT: N/A Lipid panel: Cholesterol 227, HDL 34, LDL 182.  Otherwise normal TSH: Ordered: 0.639, within normal limits HCG: N/A Vitamin D  25-hydroxy: Ordered: 8.6 low, replace with vitamin D   50,000 units q. 7 days x 7 doses UDS: Positive for marijuana UA: Hazy, ketones 20, protein 30, specific gravity 1.035, otherwise normal Vitamin B-12: N/A Hemoglobin A1c 5.5, within normal limits EKG: Normal sinus rhythm with sinus arrhythmia, ventricular rate 63, QT/QTc 390/399  Psychotherapy: Therapeutic milieu  Medications: See MAR  Consultations: N/A  Discharge Concerns: Safety  Estimated LOS: 5 to 7 days  Other:      Assessment: Jeremy Taylor is a 24 year old African-American male with prior psychiatric diagnosis significant for paranoid schizophrenia, cannabis dependence, who presents voluntarily to [facility] from [facility] health ED at [facility] worsening memory loss, psychiatric evaluation and hallucinations in the context of cannabis dependence.  BAL less than 15, UDS positive for marijuana.   Patient remains psychotic. Plan to increase risperidone . Likely will require LAI to ensure continued treatment of schizophrenia.    Physician Treatment Plan for Primary Diagnosis: Paranoid schizophrenia (HCC)   Plans: Medications: Resume home medications --increase Risperdal  tablets 2 mg at bedtime and 1 mg in AM p.o. for schizophrenia --Continue Zoloft  tablet 25 mg p.o. daily for depression and anxiety --Continue trazodone  tablet 50 mg p.o. daily for insomnia --Continue hydroxyzine  tablet 25 mg p.o. 3 times daily as needed for anxiety  Continue [facility] agitation protocol as recommended   Medication for other medical problems:  -- Initiate vitamin D  50,000 units q. 7 days x 7 doses for vitamin D  deficiency   Other PRN Medications -Acetaminophen  650 mg every 6 as needed/mild pain -Maalox 30 mL oral every 4 as needed/digestion -Magnesium  hydroxide 30 mL daily as needed/mild constipation   --The risks/benefits/side-effects/alternatives to this medication were discussed in detail with the patient and time was given for questions. The patient consents to  medication trial.  -- Metabolic profile and EKG monitoring  obtained while on an atypical antipsychotic (BMI: Lipid Panel: HbgA1c: QTc:)  -- Encouraged patient to participate in unit milieu and in scheduled group therapies   Safety and Monitoring: Voluntary admission to inpatient psychiatric unit for safety, stabilization and treatment Daily contact with patient to assess and evaluate symptoms and progress in treatment Patient's case to be discussed in multi-disciplinary team meeting Observation Level : q15 minute checks Vital signs: q12 hours Precautions: suicide, but pt currently verbally contracts for safety on unit    Discharge Planning: Social work and case management to assist with discharge planning and identification of hospital follow-up needs prior to discharge Estimated LOS: 5-7 days Discharge Concerns: Need to establish Safety plan; Medication compliance and effectiveness Discharge Goals: Return home with outpatient referrals for mental health follow-up including medication management/psychotherapy.   Long Term Goal(s): Improvement in symptoms so as ready for discharge   Short Term Goals: Ability to identify changes in lifestyle to reduce recurrence of condition will improve, Ability to verbalize feelings will improve, Ability to disclose and discuss suicidal ideas, Ability to demonstrate self-control will improve, Ability to identify and develop effective coping behaviors will improve, Ability to maintain clinical measurements within normal limits will improve, Compliance with prescribed medications will improve, and Ability to identify triggers associated with substance abuse/mental health issues will improve   Physician Treatment Plan for Secondary Diagnosis: Principal Problem:   Paranoid schizophrenia (HCC)   I certify that inpatient services furnished can reasonably be expected to improve the patient's condition.     Prentice Espy, MD 03/12/2024, 12:55 PM

## 2024-03-12 NOTE — Plan of Care (Signed)
   Problem: Activity: Goal: Interest or engagement in activities will improve Outcome: Progressing   Problem: Coping: Goal: Ability to demonstrate self-control will improve Outcome: Progressing   Problem: Safety: Goal: Periods of time without injury will increase Outcome: Progressing

## 2024-03-12 NOTE — Group Note (Signed)
 Date:  03/12/2024 Time:  3:29 PM  Group Topic/Focus: Social wellness  Mental health and social wellness are deeply linked: social wellness is about building and maintaining healthy, supportive relationships, which directly boosts mental health by providing emotional support, reducing stress, increasing self-esteem, and fostering a sense of belonging, while loneliness and poor social connections increase risks for anxiety and depression. Key to this are clear communication, setting boundaries, empathy, and actively connecting with others, creating a positive cycle for overall well-being     Participation Level:  Did Not Attend   Jeremy Taylor 03/12/2024, 3:29 PM

## 2024-03-13 DIAGNOSIS — F200 Paranoid schizophrenia: Secondary | ICD-10-CM | POA: Diagnosis not present

## 2024-03-13 MED ADMIN — Trazodone HCl Tab 50 MG: 50 mg | ORAL | NDC 00904686861

## 2024-03-13 MED ADMIN — Sertraline HCl Tab 25 MG: 25 mg | ORAL | NDC 60687023111

## 2024-03-13 MED ADMIN — Risperidone Tab 2 MG: 2 mg | ORAL | NDC 00904736361

## 2024-03-13 MED ADMIN — Risperidone Tab 1 MG: 1 mg | ORAL | NDC 00904736261

## 2024-03-13 NOTE — Progress Notes (Signed)
 [facility] MD Progress Note  03/13/2024 10:46 AM Jeremy Taylor  MRN:  [id number]  Principal Problem: Paranoid schizophrenia (HCC) Diagnosis: Principal Problem:   Paranoid schizophrenia (HCC)  Reason for admission: : Jeremy Taylor is a 24 year old African-American male with prior psychiatric diagnosis significant for paranoid schizophrenia, cannabis dependence, who presents involuntarily to [facility] from [facility] health ED at [facility] worsening memory loss, psychiatric evaluation and hallucinations in the context of cannabis dependence.  BAL less than 15, UDS positive for marijuana.   24-hour chart review:  Case discussed in interdisciplinary team meeting. Vital signs reviewed: BP 116/65, HR 59 PRNs required: MoM for constipation Agitation protocol required: None  Today's assessment notes:  Patient evaluated in milieu. Denies SI/HI/AH. Appears less psychotic today. Endorses VH of object people that he states are difficult to understand. He reports more noticeable in daytime today. Reports eating and sleeping fine. Thought blocking less prominent but still present during assessment.  Denies having side effects to current psychiatric medications.   Psychosocial interventions: -Motivational interviewing   -Daily medication management with psychiatry  -Medication education regarding risks/benefits and alternatives  -Bedside psychotherapy as indicated   -Patient will be encouraged to participate and engage with group therapy   -Appreciate SW assistance in coordinating safe disposition   -Anticipated LOS 5-7 days    Total Time spent with patient: 45 minutes  Past Psychiatric History:  Previous Psych Diagnoses: Paranoid schizophrenia and marijuana dependence Prior inpatient treatment: Unable to assess (UTA) Current/prior outpatient treatment: UTA Prior rehab hx: UTA Psychotherapy hx: UTA History of suicide: UTA History of homicide or aggression:  UTA Psychiatric medication history: Patient has been on trial Zoloft , Risperdal , and trazodone  Psychiatric medication compliance history: UTA Neuromodulation history: UTA Current Psychiatrist: UTA Current therapist: UTA   Past Medical History: History reviewed. No pertinent past medical history. History reviewed. No pertinent surgical history. Family History: History reviewed. No pertinent family history. Family Psychiatric  History: See H&P Social History:  Social History   Substance and Sexual Activity  Alcohol Use None     Social History   Substance and Sexual Activity  Drug Use Not on file    Social History   Socioeconomic History   Marital status: Single    Spouse name: Not on file   Number of children: Not on file   Years of education: Not on file   Highest education level: Not on file  Occupational History   Not on file  Tobacco Use   Smoking status: Never   Smokeless tobacco: Never  Substance and Sexual Activity   Alcohol use: Not on file   Drug use: Not on file   Sexual activity: Not on file  Other Topics Concern   Not on file  Social History Narrative   Not on file   Social Drivers of Health   Tobacco Use: Low Risk (03/09/2024)   Patient History    Smoking Tobacco Use: Never    Smokeless Tobacco Use: Never    Passive Exposure: Not on file  Financial Resource Strain: Not on file  Food Insecurity: Patient Declined (03/09/2024)   Epic    Worried About Programme Researcher, Broadcasting/film/video in the Last Year: Patient declined    Barista in the Last Year: Patient declined  Transportation Needs: Patient Declined (03/09/2024)   Epic    Lack of Transportation (Medical): Patient declined    Lack of Transportation (Non-Medical): Patient declined  Physical Activity: Not on file  Stress: Not on file  Social Connections: Not on file  Depression (EYV7-0): Not on file  Alcohol Screen: Low Risk (03/09/2024)   Alcohol Screen    Last Alcohol Screening Score (AUDIT): 0   Housing: Unknown (03/09/2024)   Epic    Unable to Pay for Housing in the Last Year: Patient declined    Number of Times Moved in the Last Year: 0    Homeless in the Last Year: Patient declined  Utilities: Not At Risk (03/09/2024)   Epic    Threatened with loss of utilities: No  Health Literacy: Not on file   Additional Social History:   Sleep: Good Estimated Sleeping Duration (Last 24 Hours): 7.50-8.00 hours  Appetite:  Good  Current Medications: Current Facility-Administered Medications  Medication Dose Route Frequency Provider Last Rate Last Admin   acetaminophen  (TYLENOL ) tablet 650 mg  650 mg Oral Q6H PRN Mannie Jerel PARAS, NP   650 mg at 03/12/24 0917   alum & mag hydroxide-simeth (MAALOX/MYLANTA) 200-200-20 MG/5ML suspension 30 mL  30 mL Oral Q4H PRN Mannie Jerel PARAS, NP       haloperidol  (HALDOL ) tablet 5 mg  5 mg Oral TID PRN Mannie Jerel PARAS, NP       And   diphenhydrAMINE  (BENADRYL ) capsule 50 mg  50 mg Oral TID PRN Mannie Jerel PARAS, NP       haloperidol  lactate (HALDOL ) injection 10 mg  10 mg Intramuscular TID PRN Mannie Jerel PARAS, NP       And   diphenhydrAMINE  (BENADRYL ) injection 50 mg  50 mg Intramuscular TID PRN Mannie Jerel PARAS, NP       And   LORazepam  (ATIVAN ) injection 2 mg  2 mg Intramuscular TID PRN Mannie Jerel PARAS, NP       haloperidol  lactate (HALDOL ) injection 5 mg  5 mg Intramuscular TID PRN Mannie Jerel PARAS, NP       And   diphenhydrAMINE  (BENADRYL ) injection 50 mg  50 mg Intramuscular TID PRN Mannie Jerel PARAS, NP       And   LORazepam  (ATIVAN ) injection 2 mg  2 mg Intramuscular TID PRN Mannie Jerel PARAS, NP       hydrOXYzine  (ATARAX ) tablet 25 mg  25 mg Oral TID PRN Ntuen, Tina C, FNP   25 mg at 03/12/24 2116   magnesium  hydroxide (MILK OF MAGNESIA) suspension 30 mL  30 mL Oral Daily PRN Mannie Jerel PARAS, NP   30 mL at 03/12/24 0917   nicotine  (NICODERM CQ  - dosed in mg/24 hours) patch 21 mg  21 mg Transdermal Q0600 Mannie Jerel PARAS, NP   21 mg at  03/13/24 0800   risperiDONE  (RISPERDAL ) tablet 1 mg  1 mg Oral Daily Lynnette Barter, MD   1 mg at 03/13/24 9196   risperiDONE  (RISPERDAL ) tablet 2 mg  2 mg Oral QHS Lynnette Barter, MD   2 mg at 03/12/24 2115   sertraline  (ZOLOFT ) tablet 25 mg  25 mg Oral Daily Ntuen, Tina C, FNP   25 mg at 03/13/24 0804   traZODone  (DESYREL ) tablet 50 mg  50 mg Oral QHS PRN Ntuen, Tina C, FNP   50 mg at 03/12/24 2115   Vitamin D  (Ergocalciferol ) (DRISDOL ) 1.25 MG (50000 UNIT) capsule 50,000 Units  50,000 Units Oral Q7 days Ntuen, Tina C, FNP       Lab Results:  No results found for this or any previous visit (from the past 48 hours).  Blood Alcohol level:  Lab Results  Component Value Date   [facility] <15 03/08/2024   Metabolic Disorder Labs: Lab Results  Component Value Date   HGBA1C 5.5 03/10/2024   MPG 111.15 03/10/2024   No results found for: PROLACTIN Lab Results  Component Value Date   CHOL 227 (H) 03/10/2024   TRIG 57 03/10/2024   HDL 34 (L) 03/10/2024   CHOLHDL 6.8 03/10/2024   VLDL 11 03/10/2024   LDLCALC 182 (H) 03/10/2024   Physical Findings: AIMS:  ,  ,  ,  ,  ,  ,   CIWA:    COWS:     Musculoskeletal: Strength & Muscle Tone: within normal limits Gait & Station: normal Patient leans: N/A  Psychiatric Specialty Exam:  Presentation  General Appearance:  Bizarre  Eye Contact: Fleeting  Speech: Blocked  Speech Volume: -- (Blocked and low)  Handedness: Right  Mood and Affect  Mood: Fine Affect: flat Thought Process  Thought Processes: Disorganized; Irrevelant  Descriptions of Associations:Loose  Orientation: axox3 Thought Content:Paranoid Ideation; Delusions  History of Schizophrenia/Schizoaffective disorder:Yes  Duration of Psychotic Symptoms:Greater than six months  Hallucinations: VH of object people  Ideas of Reference: denies Suicidal Thoughts:denies  Homicidal Thoughts:denies  Sensorium  Memory: Immediate Poor; Recent  Poor  Judgment: Poor  Insight: Poor  Executive Functions  Concentration: Poor  Attention Span: Poor  Recall: Poor  Fund of Knowledge: Poor  Language: Poor  Psychomotor Activity  Psychomotor Activity: No data recorded  Assets  Assets: -- (UTA)  Sleep  Sleep: No data recorded  Physical Exam: Physical Exam Vitals and nursing note reviewed.  Constitutional:      Appearance: He is normal weight.  HENT:     Head: Normocephalic.     Right Ear: External ear normal.     Left Ear: External ear normal.     Nose: Nose normal.     Mouth/Throat:     Mouth: Mucous membranes are moist.     Pharynx: Oropharynx is clear.  Eyes:     Extraocular Movements: Extraocular movements intact.  Cardiovascular:     Rate and Rhythm: Normal rate.     Pulses: Normal pulses.  Pulmonary:     Effort: Pulmonary effort is normal.  Musculoskeletal:        General: Normal range of motion.     Cervical back: Normal range of motion.  Skin:    General: Skin is dry.  Neurological:     Mental Status: He is alert and oriented to person, place, and time.  Psychiatric:     Comments: Patient appears dysphoric with thought blocking and selective mutism    Review of Systems  Constitutional:  Negative for chills and fever.  HENT:  Negative for sore throat.   Eyes:  Negative for blurred vision.  Respiratory:  Negative for cough, sputum production, shortness of breath and wheezing.   Cardiovascular:  Negative for chest pain.  Gastrointestinal:  Negative for abdominal pain, constipation, diarrhea, heartburn, nausea and vomiting.  Genitourinary:  Negative for dysuria.  Musculoskeletal:  Negative for falls.  Skin:  Negative for itching and rash.  Neurological:  Negative for dizziness and headaches.  Endo/Heme/Allergies: Negative.   Psychiatric/Behavioral:  Positive for hallucinations (As per chart review ongoing auditory and visual hallucinations). Negative for depression, substance abuse and  suicidal ideas. The patient is nervous/anxious. The patient does not have insomnia.    Blood pressure 116/65, pulse (!) 59, temperature 97.8 F (36.6 C), temperature source Oral, resp. rate 16, height 5' 9 (1.753 m), weight 62.6  kg, SpO2 100%. Body mass index is 20.38 kg/m.  Treatment Plan Summary: Daily contact with patient to assess and evaluate symptoms and progress in treatment and Medication management   Observation Level/Precautions:  15 minute checks  Laboratory:   CBC: Within normal limits Chemistry Profile: Albumin 5.2, glucose 156, otherwise normal Folic Acid:  GGT: N/A Lipid panel: Cholesterol 227, HDL 34, LDL 182.  Otherwise normal TSH: Ordered: 0.639, within normal limits HCG: N/A Vitamin D  25-hydroxy: Ordered: 8.6 low, replace with vitamin D  50,000 units q. 7 days x 7 doses UDS: Positive for marijuana UA: Hazy, ketones 20, protein 30, specific gravity 1.035, otherwise normal Vitamin B-12: N/A Hemoglobin A1c 5.5, within normal limits EKG: Normal sinus rhythm with sinus arrhythmia, ventricular rate 63, QT/QTc 390/399  Psychotherapy: Therapeutic milieu  Medications: See MAR  Consultations: N/A  Discharge Concerns: Safety  Estimated LOS: 5 to 7 days  Other:      Assessment: Montrell D Raver is a 24 year old African-American male with prior psychiatric diagnosis significant for paranoid schizophrenia, cannabis dependence, who presents voluntarily to [facility] from [facility] health ED at [facility] worsening memory loss, psychiatric evaluation and hallucinations in the context of cannabis dependence.  BAL less than 15, UDS positive for marijuana.   Patient remains psychotic. Plan to increase risperidone . Likely will require LAI to ensure continued treatment of schizophrenia.    Physician Treatment Plan for Primary Diagnosis: Paranoid schizophrenia (HCC)   Plans: Medications: Resume home medications --continue Risperdal  tablets 2 mg at  bedtime and 1 mg in AM p.o. for schizophrenia  -recommend LAI --Continue Zoloft  tablet 25 mg p.o. daily for depression and anxiety --Continue trazodone  tablet 50 mg p.o. daily for insomnia --Continue hydroxyzine  tablet 25 mg p.o. 3 times daily as needed for anxiety  Continue [facility] agitation protocol as recommended   Medication for other medical problems:  -- Initiate vitamin D  50,000 units q. 7 days x 7 doses for vitamin D  deficiency   Other PRN Medications -Acetaminophen  650 mg every 6 as needed/mild pain -Maalox 30 mL oral every 4 as needed/digestion -Magnesium  hydroxide 30 mL daily as needed/mild constipation   --The risks/benefits/side-effects/alternatives to this medication were discussed in detail with the patient and time was given for questions. The patient consents to medication trial.  -- Metabolic profile and EKG monitoring obtained while on an atypical antipsychotic (BMI: Lipid Panel: HbgA1c: QTc:)  -- Encouraged patient to participate in unit milieu and in scheduled group therapies   Safety and Monitoring: Voluntary admission to inpatient psychiatric unit for safety, stabilization and treatment Daily contact with patient to assess and evaluate symptoms and progress in treatment Patient's case to be discussed in multi-disciplinary team meeting Observation Level : q15 minute checks Vital signs: q12 hours Precautions: suicide, but pt currently verbally contracts for safety on unit    Discharge Planning: Social work and case management to assist with discharge planning and identification of hospital follow-up needs prior to discharge Estimated LOS: 5-7 days Discharge Concerns: Need to establish Safety plan; Medication compliance and effectiveness Discharge Goals: Return home with outpatient referrals for mental health follow-up including medication management/psychotherapy.   Long Term Goal(s): Improvement in symptoms so as ready for discharge   Short Term Goals: Ability to  identify changes in lifestyle to reduce recurrence of condition will improve, Ability to verbalize feelings will improve, Ability to disclose and discuss suicidal ideas, Ability to demonstrate self-control will improve, Ability to identify and develop effective coping behaviors will improve, Ability  to maintain clinical measurements within normal limits will improve, Compliance with prescribed medications will improve, and Ability to identify triggers associated with substance abuse/mental health issues will improve   Physician Treatment Plan for Secondary Diagnosis: Principal Problem:   Paranoid schizophrenia (HCC)   I certify that inpatient services furnished can reasonably be expected to improve the patient's condition.     Prentice Espy, MD 03/13/2024, 10:46 AM

## 2024-03-13 NOTE — Plan of Care (Signed)
   Problem: Coping: Goal: Ability to verbalize frustrations and anger appropriately will improve Outcome: Progressing Goal: Ability to demonstrate self-control will improve Outcome: Progressing

## 2024-03-13 NOTE — Group Note (Signed)
 Date:  03/13/2024 Time:  5:06 PM  Group Topic/Focus:   The focus of this group is to introduce the topic of wellness using collage. Patients created intuitive collages: serving as a creative outlet for expressing emotions, reducing anxiety, fostering group cohesion and enabling personal insight and healing.      Participation Level:  Did Not Attend  Jeremy Taylor 03/13/2024, 5:06 PM

## 2024-03-13 NOTE — Progress Notes (Signed)
 D. Pt presents with a flat affect- a little guarded, appears to be thought blocking at times during interactions. Pt denied SI/HI and reported AH occur on and off. Pt reported sleeping well last night, described his appetite and concentration as 'good' and energy level as 'hyper'. Per pt's self inventory, pt rated his depression,hopelessness and anxiety a 0/0/3, respectively.  A. Labs and vitals monitored. Pt given and educated on medications. Pt supported emotionally and encouraged to express concerns and ask questions.   R. Pt remains safe with 15 minute checks. Will continue POC.

## 2024-03-13 NOTE — [facility] Group Notes (Signed)
 Jeremy Taylor attended the CSW group 03/13/24

## 2024-03-13 NOTE — Group Note (Signed)
 Date:  03/13/2024 Time:  9:18 AM  Group Topic/Focus: Goals group  Following an initial icebreaker and introductions, patients engaged in a structured goal-setting activity. Utilizing SMART goal worksheets, participants identified and disclosed personal objectives related to recovery maintenance and self-care strategies.   Participation Level:  Did Not Attend  Additional Comments:  Pt did not attend goals group  Kristi HERO [facility] 03/13/2024, 9:18 AM

## 2024-03-13 NOTE — Progress Notes (Signed)
" °   03/13/24 2143  Psych Admission Type (Psych Patients Only)  Admission Status Involuntary  Psychosocial Assessment  Patient Complaints None  Eye Contact Intense  Facial Expression Flat  Affect Preoccupied  Speech Soft  Interaction Guarded  Motor Activity Slow  Appearance/Hygiene In scrubs  Behavior Characteristics Appropriate to situation  Mood Preoccupied  Thought Process  Coherency Blocking  Content WDL  Delusions None reported or observed  Perception Hallucinations  Hallucination Auditory  Judgment Limited  Confusion None  Danger to Self  Current suicidal ideation? Denies  Agreement Not to Harm Self Yes  Description of Agreement verbal  Danger to Others  Danger to Others None reported or observed    "

## 2024-03-13 NOTE — Progress Notes (Signed)
" °   03/13/24 0800  Psych Admission Type (Psych Patients Only)  Admission Status Involuntary  Psychosocial Assessment  Patient Complaints None  Eye Contact Staring  Facial Expression Flat  Affect Flat  Speech Soft  Interaction Minimal;Guarded  Motor Activity Slow  Appearance/Hygiene Unremarkable  Behavior Characteristics Cooperative  Mood Preoccupied  Aggressive Behavior  Effect No apparent injury  Thought Process  Coherency Blocking  Content WDL  Delusions None reported or observed  Perception Hallucinations  Hallucination Auditory (on and off)  Judgment Limited  Confusion None  Danger to Self  Current suicidal ideation? Denies  Danger to Others  Danger to Others None reported or observed    "

## 2024-03-13 NOTE — Group Note (Signed)
"                                                 [facility] LCSW Group Therapy Note    Group Date: 03/13/2024 Start Time: 0940 End Time: 1030  Type of Therapy and Topic:  Group Therapy:  Overcoming Obstacles  Participation Level:  [facility] PARTICIPATION LEVEL: Active  Mood:  Description of Group:   In this group patients will be encouraged to explore what they see as obstacles to their own wellness and recovery. They will be guided to discuss their thoughts, feelings, and behaviors related to these obstacles. The group will process together ways to cope with barriers, with attention given to specific choices patients can make. Each patient will be challenged to identify changes they are motivated to make in order to overcome their obstacles. This group will be process-oriented, with patients participating in exploration of their own experiences as well as giving and receiving support and challenge from other group members.  Therapeutic Goals: 1. Patient will identify personal and current obstacles as they relate to admission. 2. Patient will identify barriers that currently interfere with their wellness or overcoming obstacles.  3. Patient will identify feelings, thought process and behaviors related to these barriers. 4. Patient will identify two changes they are willing to make to overcome these obstacles:    Summary of Patient Progress   Pt was present and respectful during group, pt was also able to identify feelings, thought process and behaviors pertaining to their wellness or overcoming obstacles.   Therapeutic Modalities:   Cognitive Behavioral Therapy Solution Focused Therapy Motivational Interviewing Relapse Prevention Therapy   Golda Louder, LCSW "

## 2024-03-13 NOTE — Group Note (Signed)
 Date:  03/13/2024 Time:  3:11 PM  Group Topic/Focus: Social Wellness Patients participated in a movie trivia activity designed to facilitate social interaction and cognitive engagement. The exercise promoted collective brainstorming and creative expression within a therapeutic group framework.    Participation Level:  Did Not Attend  Additional Comments:  Pt did not attend this group  Kristi HERO [facility] 03/13/2024, 3:11 PM

## 2024-03-13 NOTE — Group Note (Signed)
 Date:  03/13/2024 Time:  9:08 PM  Group Topic/Focus:  Wrap-Up Group:   The focus of this group is to help patients review their daily goal of treatment and discuss progress on daily workbooks.    Participation Level:  Did Not Attend  Participation Quality:  Resistant  Affect:  Resistant  Cognitive:  Lacking  Insight: None  Engagement in Group:  Resistant  Modes of Intervention:  Discussion  Additional Comments:  Patient did not attend group this evening   Bari Moats 03/13/2024, 9:08 PM

## 2024-03-14 MED ORDER — PANTOPRAZOLE SODIUM 40 MG PO TBEC
40.0000 mg | DELAYED_RELEASE_TABLET | Freq: Every day | ORAL | Status: DC
  Administered 2024-03-14 – 2024-03-21 (×8): 40 mg via ORAL
  Filled 2024-03-14 (×4): qty 1

## 2024-03-14 MED ORDER — DOCUSATE SODIUM 100 MG PO CAPS
100.0000 mg | ORAL_CAPSULE | Freq: Two times a day (BID) | ORAL | Status: DC
  Administered 2024-03-14 – 2024-03-21 (×12): 100 mg via ORAL
  Filled 2024-03-14 (×4): qty 1

## 2024-03-14 MED ADMIN — Sertraline HCl Tab 25 MG: 25 mg | ORAL | NDC 60687023111

## 2024-03-14 MED ADMIN — Risperidone Tab 2 MG: 2 mg | ORAL | NDC 00904736361

## 2024-03-14 MED ADMIN — Risperidone Tab 1 MG: 1 mg | ORAL | NDC 00904736261

## 2024-03-14 NOTE — Plan of Care (Signed)
   Problem: Education: Goal: Verbalization of understanding the information provided will improve Outcome: Progressing   Problem: Activity: Goal: Interest or engagement in activities will improve Outcome: Progressing

## 2024-03-14 NOTE — [facility] Group Notes (Signed)
 Adult Psychoeducational Group Note  Date:  03/14/2024 Time:  9:22 PM  Group Topic/Focus:  Wrap-Up Group:   The focus of this group is to help patients review their daily goal of treatment and discuss progress on daily workbooks.  Participation Level:  Did Not Attend  Jeremy Taylor 03/14/2024, 9:22 PM

## 2024-03-14 NOTE — Plan of Care (Signed)
   Problem: Education: Goal: Knowledge of [facility] Education information/materials will improve Outcome: Progressing Goal: Emotional status will improve Outcome: Progressing Goal: Mental status will improve Outcome: Progressing Goal: Verbalization of understanding the information provided will improve Outcome: Progressing   Problem: Activity: Goal: Interest or engagement in activities will improve Outcome: Progressing

## 2024-03-14 NOTE — Progress Notes (Signed)
 [facility] MD Progress Note  03/14/2024 10:35 AM Jeremy Taylor  MRN:  [id number]  Principal Problem: Paranoid schizophrenia (HCC) Diagnosis: Principal Problem:   Paranoid schizophrenia (HCC)  Reason for admission: : Jeremy Taylor is a 24 year old African-American male with prior psychiatric diagnosis significant for paranoid schizophrenia, cannabis dependence, who presents involuntarily to [facility] from [facility] health ED at [facility] worsening memory loss, psychiatric evaluation and hallucinations in the context of cannabis dependence.  BAL less than 15, UDS positive for marijuana.   24-hour chart review:  Case discussed in interdisciplinary team meeting. Vital signs reviewed: BP 120/65, HR 73 PRNs required: Hydroxyzine  x 1 for anxiety, trazodone  x 1 for insomnia Agitation protocol required: None  Today's assessment notes:  Patient presents alert, cooperative, and oriented to person, place, and minimally to situation.  Speech much improved with less thought blocking, but still present during assessment. Able to participate in this assessment process.  Compliant with psychotropic medication without any side effect.  No EPS, cogwheel rigidity, or TD observed during this assessment. Patient evaluated in his room sitting up in the bed. Denies SI/HI/VH. Appears less psychotic today, however endorses chattering voices in his head. Reports eating and sleeping okay. Denies having side effects to current psychiatric medications.  Complaining of acid reflux and constipation.  Protonix  and Colace initiated.  Psychosocial interventions: -Motivational interviewing   -Daily medication management with psychiatry  -Medication education regarding risks/benefits and alternatives  -Bedside psychotherapy as indicated   -Patient will be encouraged to participate and engage with group therapy   -Appreciate SW assistance in coordinating safe disposition   -Anticipated LOS 5-7  days    Total Time spent with patient: 45 minutes  Past Psychiatric History:  Previous Psych Diagnoses: Paranoid schizophrenia and marijuana dependence Prior inpatient treatment: Unable to assess (UTA) Current/prior outpatient treatment: UTA Prior rehab hx: UTA Psychotherapy hx: UTA History of suicide: UTA History of homicide or aggression: UTA Psychiatric medication history: Patient has been on trial Zoloft , Risperdal , and trazodone  Psychiatric medication compliance history: UTA Neuromodulation history: UTA Current Psychiatrist: UTA Current therapist: UTA   Past Medical History: History reviewed. No pertinent past medical history. History reviewed. No pertinent surgical history. Family History: History reviewed. No pertinent family history. Family Psychiatric  History: See H&P Social History:  Social History   Substance and Sexual Activity  Alcohol Use None     Social History   Substance and Sexual Activity  Drug Use Not on file    Social History   Socioeconomic History   Marital status: Single    Spouse name: Not on file   Number of children: Not on file   Years of education: Not on file   Highest education level: Not on file  Occupational History   Not on file  Tobacco Use   Smoking status: Never   Smokeless tobacco: Never  Substance and Sexual Activity   Alcohol use: Not on file   Drug use: Not on file   Sexual activity: Not on file  Other Topics Concern   Not on file  Social History Narrative   Not on file   Social Drivers of Health   Tobacco Use: Low Risk (03/09/2024)   Patient History    Smoking Tobacco Use: Never    Smokeless Tobacco Use: Never    Passive Exposure: Not on file  Financial Resource Strain: Not on file  Food Insecurity: Patient Declined (03/09/2024)   Epic    Worried About Radiation Protection Practitioner  of Food in the Last Year: Patient declined    Ran Out of Food in the Last Year: Patient declined  Transportation Needs: Patient Declined  (03/09/2024)   Epic    Lack of Transportation (Medical): Patient declined    Lack of Transportation (Non-Medical): Patient declined  Physical Activity: Not on file  Stress: Not on file  Social Connections: Not on file  Depression (PHQ2-9): Not on file  Alcohol Screen: Low Risk (03/09/2024)   Alcohol Screen    Last Alcohol Screening Score (AUDIT): 0  Housing: Unknown (03/09/2024)   Epic    Unable to Pay for Housing in the Last Year: Patient declined    Number of Times Moved in the Last Year: 0    Homeless in the Last Year: Patient declined  Utilities: Not At Risk (03/09/2024)   Epic    Threatened with loss of utilities: No  Health Literacy: Not on file   Additional Social History:   Sleep: Good Estimated Sleeping Duration (Last 24 Hours): 7.00-8.25 hours  Appetite:  Good  Current Medications: Current Facility-Administered Medications  Medication Dose Route Frequency Provider Last Rate Last Admin   acetaminophen  (TYLENOL ) tablet 650 mg  650 mg Oral Q6H PRN Mannie Jerel PARAS, NP   650 mg at 03/12/24 0917   alum & mag hydroxide-simeth (MAALOX/MYLANTA) 200-200-20 MG/5ML suspension 30 mL  30 mL Oral Q4H PRN Mannie Jerel PARAS, NP       haloperidol  (HALDOL ) tablet 5 mg  5 mg Oral TID PRN Mannie Jerel PARAS, NP       And   diphenhydrAMINE  (BENADRYL ) capsule 50 mg  50 mg Oral TID PRN Mannie Jerel PARAS, NP       haloperidol  lactate (HALDOL ) injection 10 mg  10 mg Intramuscular TID PRN Mannie Jerel PARAS, NP       And   diphenhydrAMINE  (BENADRYL ) injection 50 mg  50 mg Intramuscular TID PRN Mannie Jerel PARAS, NP       And   LORazepam  (ATIVAN ) injection 2 mg  2 mg Intramuscular TID PRN Mannie Jerel PARAS, NP       haloperidol  lactate (HALDOL ) injection 5 mg  5 mg Intramuscular TID PRN Mannie Jerel PARAS, NP       And   diphenhydrAMINE  (BENADRYL ) injection 50 mg  50 mg Intramuscular TID PRN Mannie Jerel PARAS, NP       And   LORazepam  (ATIVAN ) injection 2 mg  2 mg Intramuscular TID PRN Mannie Jerel PARAS, NP       hydrOXYzine  (ATARAX ) tablet 25 mg  25 mg Oral TID PRN Dyson Sevey C, FNP   25 mg at 03/13/24 2116   magnesium  hydroxide (MILK OF MAGNESIA) suspension 30 mL  30 mL Oral Daily PRN Mannie Jerel PARAS, NP   30 mL at 03/12/24 9082   nicotine  (NICODERM CQ  - dosed in mg/24 hours) patch 21 mg  21 mg Transdermal Q0600 Mannie Jerel PARAS, NP   21 mg at 03/14/24 9094   risperiDONE  (RISPERDAL ) tablet 1 mg  1 mg Oral Daily Lynnette Barter, MD   1 mg at 03/14/24 9095   risperiDONE  (RISPERDAL ) tablet 2 mg  2 mg Oral QHS Lynnette Barter, MD   2 mg at 03/13/24 2115   sertraline  (ZOLOFT ) tablet 25 mg  25 mg Oral Daily Arionne Iams C, FNP   25 mg at 03/14/24 9095   traZODone  (DESYREL ) tablet 50 mg  50 mg Oral QHS PRN Gunner Iodice C, FNP   50 mg at  03/13/24 2115   Vitamin D  (Ergocalciferol ) (DRISDOL ) 1.25 MG (50000 UNIT) capsule 50,000 Units  50,000 Units Oral Q7 days Kenni Newton, Ellouise BROCKS, FNP       Lab Results:  No results found for this or any previous visit (from the past 48 hours).  Blood Alcohol level:  Lab Results  Component Value Date   [facility] <15 03/08/2024   Metabolic Disorder Labs: Lab Results  Component Value Date   HGBA1C 5.5 03/10/2024   MPG 111.15 03/10/2024   No results found for: PROLACTIN Lab Results  Component Value Date   CHOL 227 (H) 03/10/2024   TRIG 57 03/10/2024   HDL 34 (L) 03/10/2024   CHOLHDL 6.8 03/10/2024   VLDL 11 03/10/2024   LDLCALC 182 (H) 03/10/2024   Physical Findings: AIMS:  ,  ,  ,  ,  ,  ,   CIWA:    COWS:     Musculoskeletal: Strength & Muscle Tone: within normal limits Gait & Station: normal Patient leans: N/A  Psychiatric Specialty Exam:  Presentation  General Appearance:  Casual  Eye Contact: Fair  Speech: Blocked  Speech Volume: Decreased  Handedness: Right  Mood and Affect  Mood: Fine Affect: flat Thought Process  Thought Processes: Coherent  Descriptions of Associations:Intact  Orientation: axox3 Thought Content:Paranoid  Ideation  History of Schizophrenia/Schizoaffective disorder:Yes  Duration of Psychotic Symptoms:Greater than six months  Hallucinations: VH of object people  Ideas of Reference: denies Suicidal Thoughts:denies  Homicidal Thoughts:denies  Sensorium  Memory: Immediate Poor  Judgment: Poor  Insight: Poor  Executive Functions  Concentration: Fair  Attention Span: Fair  Recall: Fair  Fund of Knowledge: Poor  Language: Poor  Psychomotor Activity  Psychomotor Activity: Psychomotor Activity: Normal   Assets  Assets: Physical Health; Resilience  Sleep  Sleep: Sleep: Good Number of Hours of Sleep: 9.25   Physical Exam: Physical Exam Vitals and nursing note reviewed.  Constitutional:      Appearance: He is normal weight.  HENT:     Head: Normocephalic.     Right Ear: External ear normal.     Left Ear: External ear normal.     Nose: Nose normal.     Mouth/Throat:     Mouth: Mucous membranes are moist.     Pharynx: Oropharynx is clear.  Eyes:     Extraocular Movements: Extraocular movements intact.  Cardiovascular:     Rate and Rhythm: Normal rate.     Pulses: Normal pulses.  Pulmonary:     Effort: Pulmonary effort is normal.  Musculoskeletal:        General: Normal range of motion.     Cervical back: Normal range of motion.  Skin:    General: Skin is dry.  Neurological:     Mental Status: He is alert and oriented to person, place, and time.  Psychiatric:     Comments: Patient appears dysphoric with thought blocking and selective mutism    Review of Systems  Constitutional:  Negative for chills and fever.  HENT:  Negative for sore throat.   Eyes:  Negative for blurred vision.  Respiratory:  Negative for cough, sputum production, shortness of breath and wheezing.   Cardiovascular:  Negative for chest pain.  Gastrointestinal:  Negative for abdominal pain, constipation, diarrhea, heartburn, nausea and vomiting.  Genitourinary:  Negative  for dysuria.  Musculoskeletal:  Negative for falls.  Skin:  Negative for itching and rash.  Neurological:  Negative for dizziness and headaches.  Endo/Heme/Allergies: Negative.   Psychiatric/Behavioral:  Positive for hallucinations (As per chart review ongoing auditory and visual hallucinations). Negative for depression, substance abuse and suicidal ideas. The patient is nervous/anxious. The patient does not have insomnia.    Blood pressure 122/79, pulse 71, temperature 97.8 F (36.6 C), temperature source Oral, resp. rate 16, height 5' 9 (1.753 m), weight 62.6 kg, SpO2 100%. Body mass index is 20.38 kg/m.  Treatment Plan Summary: Daily contact with patient to assess and evaluate symptoms and progress in treatment and Medication management   Observation Level/Precautions:  15 minute checks  Laboratory:   CBC: Within normal limits Chemistry Profile: Albumin 5.2, glucose 156, otherwise normal Folic Acid:  GGT: N/A Lipid panel: Cholesterol 227, HDL 34, LDL 182.  Otherwise normal TSH: Ordered: 0.639, within normal limits HCG: N/A Vitamin D  25-hydroxy: Ordered: 8.6 low, replace with vitamin D  50,000 units q. 7 days x 7 doses UDS: Positive for marijuana UA: Hazy, ketones 20, protein 30, specific gravity 1.035, otherwise normal Vitamin B-12: N/A Hemoglobin A1c 5.5, within normal limits EKG: Normal sinus rhythm with sinus arrhythmia, ventricular rate 63, QT/QTc 390/399  Psychotherapy: Therapeutic milieu  Medications: See MAR  Consultations: N/A  Discharge Concerns: Safety  Estimated LOS: 5 to 7 days  Other:      Assessment: Jeremy Taylor is a 24 year old African-American male with prior psychiatric diagnosis significant for paranoid schizophrenia, cannabis dependence, who presents voluntarily to [facility] from [facility] health ED at [facility] worsening memory loss, psychiatric evaluation and hallucinations in the context of cannabis dependence.  BAL  less than 15, UDS positive for marijuana.   Patient remains psychotic. Plan to increase risperidone . Likely will require LAI to ensure continued treatment of schizophrenia.    Physician Treatment Plan for Primary Diagnosis: Paranoid schizophrenia (HCC)   Plans: Medications: Resume home medications --continue Risperdal  tablets 2 mg at bedtime and 1 mg in AM p.o. for schizophrenia  -recommend LAI --Continue Zoloft  tablet 25 mg p.o. daily for depression and anxiety --Continue trazodone  tablet 50 mg p.o. daily for insomnia --Continue hydroxyzine  tablet 25 mg p.o. 3 times daily as needed for anxiety  Continue [facility] agitation protocol as recommended   Medication for other medical problems:  -- Continue vitamin D  50,000 units q. 7 days x 7 doses for vitamin D  deficiency --Initiate Protonix  40 mg p.o. daily for GERD --Initiate Colace 100 mg p.o. twice daily for constipation   Other PRN Medications -Acetaminophen  650 mg every 6 as needed/mild pain -Maalox 30 mL oral every 4 as needed/digestion -Magnesium  hydroxide 30 mL daily as needed/mild constipation   --The risks/benefits/side-effects/alternatives to this medication were discussed in detail with the patient and time was given for questions. The patient consents to medication trial.  -- Metabolic profile and EKG monitoring obtained while on an atypical antipsychotic (BMI: Lipid Panel: HbgA1c: QTc:)  -- Encouraged patient to participate in unit milieu and in scheduled group therapies   Safety and Monitoring: Voluntary admission to inpatient psychiatric unit for safety, stabilization and treatment Daily contact with patient to assess and evaluate symptoms and progress in treatment Patient's case to be discussed in multi-disciplinary team meeting Observation Level : q15 minute checks Vital signs: q12 hours Precautions: suicide, but pt currently verbally contracts for safety on unit    Discharge Planning: Social work and case management to  assist with discharge planning and identification of hospital follow-up needs prior to discharge Estimated LOS: 5-7 days Discharge Concerns: Need to establish Safety plan; Medication compliance and effectiveness Discharge Goals:  Return home with outpatient referrals for mental health follow-up including medication management/psychotherapy.   Long Term Goal(s): Improvement in symptoms so as ready for discharge   Short Term Goals: Ability to identify changes in lifestyle to reduce recurrence of condition will improve, Ability to verbalize feelings will improve, Ability to disclose and discuss suicidal ideas, Ability to demonstrate self-control will improve, Ability to identify and develop effective coping behaviors will improve, Ability to maintain clinical measurements within normal limits will improve, Compliance with prescribed medications will improve, and Ability to identify triggers associated with substance abuse/mental health issues will improve   Physician Treatment Plan for Secondary Diagnosis: Principal Problem:   Paranoid schizophrenia (HCC)   I certify that inpatient services furnished can reasonably be expected to improve the patient's condition.     Ellouise JAYSON Azure, FNP 03/14/2024, 10:35 AM Patient ID: Alfonzo JONETTA Lex, male   DOB: 10-08-2000, 24 y.o.   MRN: [id number]

## 2024-03-14 NOTE — Progress Notes (Addendum)
(  Sleep Hours) - 9.25 hours (Any PRNs that were needed, meds refused, or side effects to meds)-  Trazodone , Vistaril  given (Any disturbances and when (visitation, over night)- None (Concerns raised by the patient)-  None (SI/HI/AVH)-  Auditory hallucinations but Pt feels that they are improving

## 2024-03-14 NOTE — Progress Notes (Signed)
 Pt attended rec therapy

## 2024-03-14 NOTE — Group Note (Signed)
 Recreation Therapy Group Note   Group Topic:Communication  Group Date: 03/14/2024 Start Time: 0945 End Time: 1010 Facilitators: Shevelle Smither-McCall, LRT,CTRS Location: [address] Dayroom   Group Topic: Communication, Team Building, Problem Solving  Goal Area(s) Addresses:  Patient will effectively work with peer towards shared goal.  Patient will identify skills used to make activity successful.  Patient will identify how skills used during activity can be applied to reach post d/c goals.   Behavioral Response: Engaged  Intervention: STEM Activity- Glass Blower/designer  Activity: Tallest [employer]. In teams of 5-6, patients were given 11 craft pipe cleaners. Using the materials provided, patients were instructed to compete again the opposing team(s) to build the tallest free-standing structure from floor level. The activity was timed; difficulty increased by clinical research associate as production designer, theatre/television/film continued.  Systematically resources were removed with additional directions for example, placing one arm behind their back, working in silence, and shape stipulations. LRT facilitated post-activity discussion reviewing team processes and necessary communication skills involved in completion. Patients were encouraged to reflect how the skills utilized, or not utilized, in this activity can be incorporated to positively impact support systems post discharge.  Education: Pharmacist, Community, Scientist, Physiological, Discharge Planning   Education Outcome: Acknowledges education/In group clarification offered/Needs additional education.    Affect/Mood: Flat   Participation Level: Engaged   Participation Quality: Independent   Behavior: Appropriate   Speech/Thought Process: Focused   Insight: Good   Judgement: Good   Modes of Intervention: STEM Activity   Patient Response to Interventions:  Engaged   Education Outcome:  In group clarification offered    Clinical Observations/Individualized  Feedback: Pt was quiet and engaged. Pt made a few suggestion on creating the tower. Pt was appropriate and active during activity.      Plan: Continue to engage patient in RT group sessions 2-3x/week.   Jeremy Taylor, LRT,CTRS 03/14/2024 1:23 PM

## 2024-03-14 NOTE — Group Note (Signed)
 Date:  03/14/2024 Time:  10:04 AM  Group Topic/Focus:  Goals Group:   The focus of this group is to help patients establish daily goals to achieve during treatment and discuss how the patient can incorporate goal setting into their daily lives to aide in recovery.    Participation Level:  Active  Participation Quality:  Appropriate  Affect:  Appropriate  Cognitive:  Appropriate  Insight: Appropriate  Engagement in Group:  Engaged  Modes of Intervention:  Discussion  Additional Comments:  being discharged  Nat Rummer 03/14/2024, 10:04 AM

## 2024-03-14 NOTE — Progress Notes (Signed)
 Pt did not attend occ. Therapy group

## 2024-03-14 NOTE — Progress Notes (Signed)
 Pt did not attend grief and loss group

## 2024-03-14 NOTE — Progress Notes (Signed)
 Patient denies SI, HI, AVH. Patient stated they slept Good last night. Scored 5/10 on anxiety and depression. Patient has been calm, cooperative, and med compliant.     03/14/24 1300  Psych Admission Type (Psych Patients Only)  Admission Status Involuntary  Psychosocial Assessment  Patient Complaints None  Eye Contact Staring  Facial Expression Flat  Affect Flat  Speech Soft  Interaction Guarded  Motor Activity Slow  Appearance/Hygiene In scrubs  Behavior Characteristics Cooperative;Appropriate to situation  Mood Preoccupied  Thought Process  Coherency Blocking  Content WDL  Delusions None reported or observed  Perception WDL  Hallucination None reported or observed  Judgment Limited  Confusion None  Danger to Self  Current suicidal ideation? Denies  Agreement Not to Harm Self Yes  Description of Agreement Verbal  Danger to Others  Danger to Others None reported or observed

## 2024-03-15 MED ORDER — RISPERIDONE 2 MG PO TABS
2.0000 mg | ORAL_TABLET | Freq: Two times a day (BID) | ORAL | Status: DC
  Administered 2024-03-16: 2 mg via ORAL
  Filled 2024-03-15 (×2): qty 1

## 2024-03-15 MED ORDER — SERTRALINE HCL 50 MG PO TABS
50.0000 mg | ORAL_TABLET | Freq: Every day | ORAL | Status: DC
  Administered 2024-03-15 – 2024-03-21 (×7): 50 mg via ORAL
  Filled 2024-03-15 (×3): qty 1

## 2024-03-15 MED ADMIN — Trazodone HCl Tab 50 MG: 50 mg | ORAL | NDC 60687044311

## 2024-03-15 MED ADMIN — Risperidone Tab 1 MG: 1 mg | ORAL | NDC 68084027211

## 2024-03-15 NOTE — Progress Notes (Signed)
 (  Sleep Hours) - 7.25 (Any PRNs that were needed, meds refused, or side effects to meds)- none (Any disturbances and when (visitation, over night)- none (Concerns raised by the patient)- none (SI/HI/AVH)- denies

## 2024-03-15 NOTE — Plan of Care (Signed)
   Problem: Education: Goal: Knowledge of [place] [facility] Education information/materials will improve Outcome: Progressing Goal: Emotional status will improve Outcome: Progressing Goal: Mental status will improve Outcome: Progressing Goal: Verbalization of understanding the information provided will improve Outcome: Progressing

## 2024-03-15 NOTE — Progress Notes (Signed)
 [facility] MD Progress Note  03/15/2024 10:08 AM Jeremy Taylor  MRN:  [id number] and [id number]  (alternate chart)  Principal Problem: Paranoid schizophrenia (HCC) Diagnosis: Principal Problem:   Paranoid schizophrenia (HCC)  Reason for admission: : Jeremy Taylor is a 24 year old African-American male with prior psychiatric diagnosis significant for paranoid schizophrenia, cannabis dependence, who presents involuntarily to [facility] from [facility] health ED at [facility] worsening memory loss, psychiatric evaluation and hallucinations in the context of cannabis dependence.  BAL less than 15, UDS positive for marijuana.   Psychiatric history is notable for prior admissions for psychotic symptoms with prior diagnoses of both SAD and schizophrenia, although no clear evidence of significant affective component on chart review. Symptoms have included hallucinations, delusions, disorganization in thoughts and behavior and negative symptoms including poor attention to self-care. He has previously been domiciled in a group home for higher level of care. Prior medications include abilfiy, risperidone , and olanzapine. Most recent admission per alternate chart was in August 2025. No clear evidence of prior suicide attemtps or self-harming behavior.   24-hour chart review:  Per chart review, patient continues to endorse intermittent AH that has been improving. Presents with flattened affect and is preoccupied, however attending groups and with appropriate interactions with staff and peers. Compliant with risperidone  (1 + 2 mg) and no behavioral PRNs required. BP 109/74 (BP Location: Left Arm)   Pulse 70   Temp 97.8 F (36.6 C) (Oral)   Resp 16   Ht 5' 9 (1.753 m)   Wt 62.6 kg   SpO2 100%   BMI 20.38 kg/m     Interview today: Today the patient reports he is fine. States he slept well. When asked if the medications are helping he states yes but has difficulty  verbalizing what with. When specifically asked if they are helping with voices he states yes and denies that he is having any AVH today. Wonders when he will be able to leave the hospital and states he will be staying with his brother when he is done. Denies additional concerns or questions today. Denies SI and HI.    Total Time spent with patient: 30 minutes    Past Medical History: History reviewed. No pertinent past medical history. History reviewed. No pertinent surgical history. Family History: History reviewed. No pertinent family history. Family Psychiatric  History: See H&P Social History:  Social History   Substance and Sexual Activity  Alcohol Use None     Social History   Substance and Sexual Activity  Drug Use Not on file    Social History   Socioeconomic History   Marital status: Single    Spouse name: Not on file   Number of children: Not on file   Years of education: Not on file   Highest education level: Not on file  Occupational History   Not on file  Tobacco Use   Smoking status: Never   Smokeless tobacco: Never  Substance and Sexual Activity   Alcohol use: Not on file   Drug use: Not on file   Sexual activity: Not on file  Other Topics Concern   Not on file  Social History Narrative   Not on file   Social Drivers of Health   Tobacco Use: Low Risk (03/09/2024)   Patient History    Smoking Tobacco Use: Never    Smokeless Tobacco Use: Never    Passive Exposure: Not on file  Financial Resource Strain: Not on file  Food Insecurity: Patient Declined (03/09/2024)   Epic    Worried About Programme Researcher, Broadcasting/film/video in the Last Year: Patient declined    Barista in the Last Year: Patient declined  Transportation Needs: Patient Declined (03/09/2024)   Epic    Lack of Transportation (Medical): Patient declined    Lack of Transportation (Non-Medical): Patient declined  Physical Activity: Not on file  Stress: Not on file  Social Connections: Not on  file  Depression (PHQ2-9): Not on file  Alcohol Screen: Low Risk (03/09/2024)   Alcohol Screen    Last Alcohol Screening Score (AUDIT): 0  Housing: Unknown (03/09/2024)   Epic    Unable to Pay for Housing in the Last Year: Patient declined    Number of Times Moved in the Last Year: 0    Homeless in the Last Year: Patient declined  Utilities: Not At Risk (03/09/2024)   Epic    Threatened with loss of utilities: No  Health Literacy: Not on file   Additional Social History:   Sleep: Good Estimated Sleeping Duration (Last 24 Hours): 6.00 hours  Appetite:  Good  Current Medications: Current Facility-Administered Medications  Medication Dose Route Frequency Provider Last Rate Last Admin   acetaminophen  (TYLENOL ) tablet 650 mg  650 mg Oral Q6H PRN Mannie Jerel PARAS, NP   650 mg at 03/12/24 0917   alum & mag hydroxide-simeth (MAALOX/MYLANTA) 200-200-20 MG/5ML suspension 30 mL  30 mL Oral Q4H PRN Mannie Jerel PARAS, NP       haloperidol  (HALDOL ) tablet 5 mg  5 mg Oral TID PRN Mannie Jerel PARAS, NP       And   diphenhydrAMINE  (BENADRYL ) capsule 50 mg  50 mg Oral TID PRN Mannie Jerel PARAS, NP       haloperidol  lactate (HALDOL ) injection 10 mg  10 mg Intramuscular TID PRN Mannie Jerel PARAS, NP       And   diphenhydrAMINE  (BENADRYL ) injection 50 mg  50 mg Intramuscular TID PRN Mannie Jerel PARAS, NP       And   LORazepam  (ATIVAN ) injection 2 mg  2 mg Intramuscular TID PRN Mannie Jerel PARAS, NP       haloperidol  lactate (HALDOL ) injection 5 mg  5 mg Intramuscular TID PRN Mannie Jerel PARAS, NP       And   diphenhydrAMINE  (BENADRYL ) injection 50 mg  50 mg Intramuscular TID PRN Mannie Jerel PARAS, NP       And   LORazepam  (ATIVAN ) injection 2 mg  2 mg Intramuscular TID PRN Mannie Jerel PARAS, NP       docusate sodium  (COLACE) capsule 100 mg  100 mg Oral BID Ntuen, Tina C, FNP   100 mg at 03/15/24 0847   hydrOXYzine  (ATARAX ) tablet 25 mg  25 mg Oral TID PRN Ntuen, Tina C, FNP   25 mg at 03/13/24 2116    magnesium  hydroxide (MILK OF MAGNESIA) suspension 30 mL  30 mL Oral Daily PRN Mannie Jerel PARAS, NP   30 mL at 03/15/24 9147   nicotine  (NICODERM CQ  - dosed in mg/24 hours) patch 21 mg  21 mg Transdermal Q0600 Mannie Jerel PARAS, NP   21 mg at 03/14/24 9094   pantoprazole  (PROTONIX ) EC tablet 40 mg  40 mg Oral Daily Ntuen, Tina C, FNP   40 mg at 03/15/24 0847   risperiDONE  (RISPERDAL ) tablet 2 mg  2 mg Oral BID Towana Leita SAILOR, MD       sertraline  (ZOLOFT ) tablet 50  mg  50 mg Oral Daily Constantina Laseter N, MD   50 mg at 03/15/24 9152   traZODone  (DESYREL ) tablet 50 mg  50 mg Oral QHS PRN Ntuen, Tina C, FNP   50 mg at 03/13/24 2115   Vitamin D  (Ergocalciferol ) (DRISDOL ) 1.25 MG (50000 UNIT) capsule 50,000 Units  50,000 Units Oral Q7 days Ntuen, Tina C, FNP       Lab Results:  No results found for this or any previous visit (from the past 48 hours).  Blood Alcohol level:  Lab Results  Component Value Date   [facility] <15 03/08/2024   Metabolic Disorder Labs: Lab Results  Component Value Date   HGBA1C 5.5 03/10/2024   MPG 111.15 03/10/2024   No results found for: PROLACTIN Lab Results  Component Value Date   CHOL 227 (H) 03/10/2024   TRIG 57 03/10/2024   HDL 34 (L) 03/10/2024   CHOLHDL 6.8 03/10/2024   VLDL 11 03/10/2024   LDLCALC 182 (H) 03/10/2024   Physical Findings: AIMS:  ,  ,  ,  ,  ,  ,   CIWA:    COWS:     Mental Status exam: Appearance: black male with shaved head seen reclining in bed, appropriately groomed  Eye contact: intense  Attitude towards examiner answers questions concretely Psychomotor: no agitation, did stay stiffly in one positoin Speech: reduced amount, largely one-word responses, monotonous Language: no delay  Mood: fine Affect: flat  Thought content: denying SI and HI, no overt delusions expressed today  Thought Process: concrete but does answer questions linearly Perception: denying AVH, not overtly RTIS but did remain internally preoccupied  Insight:  poor  Judgement: fair   Orientation: x3 Attention/Concentration: fair  Memory/Cognition: not formally assessed, grossly intact   Fund of Knowledge: Average   Musculoskeletal: Strength & Muscle Tone: within normal limits Gait & Station: normal Patient leans: N/A   Physical Exam Vitals and nursing note reviewed.  Constitutional:      Appearance: He is normal weight.  HENT:     Head: Normocephalic.     Right Ear: External ear normal.     Left Ear: External ear normal.     Nose: Nose normal.     Mouth/Throat:     Mouth: Mucous membranes are moist.     Pharynx: Oropharynx is clear.  Eyes:     Extraocular Movements: Extraocular movements intact.  Cardiovascular:     Rate and Rhythm: Normal rate.     Pulses: Normal pulses.  Pulmonary:     Effort: Pulmonary effort is normal.  Musculoskeletal:        General: Normal range of motion.     Cervical back: Normal range of motion.  Skin:    General: Skin is dry.  Neurological:     Mental Status: He is alert and oriented to person, place, and time.    Review of Systems  Constitutional:  Negative for chills and fever.  HENT:  Negative for sore throat.   Eyes:  Negative for blurred vision.  Respiratory:  Negative for cough, sputum production, shortness of breath and wheezing.   Cardiovascular:  Negative for chest pain.  Gastrointestinal:  Negative for abdominal pain, constipation, diarrhea, heartburn, nausea and vomiting.  Genitourinary:  Negative for dysuria.  Musculoskeletal:  Negative for falls.  Skin:  Negative for itching and rash.  Neurological:  Negative for dizziness and headaches.  Endo/Heme/Allergies: Negative.   Psychiatric/Behavioral:  Negative for depression, substance abuse and suicidal ideas. Hallucinations: As per chart  review ongoing auditory and visual hallucinations.The patient does not have insomnia.    Blood pressure 109/74, pulse 70, temperature 97.8 F (36.6 C), temperature source Oral, resp. rate 16,  height 5' 9 (1.753 m), weight 62.6 kg, SpO2 100%. Body mass index is 20.38 kg/m.  Treatment Plan Summary: Daily contact with patient to assess and evaluate symptoms and progress in treatment and Medication management   Observation Level/Precautions:  15 minute checks  Laboratory:   CBC: Within normal limits Chemistry Profile: Albumin 5.2, glucose 156, otherwise normal Folic Acid:  GGT: N/A Lipid panel: Cholesterol 227, HDL 34, LDL 182.  Otherwise normal TSH: Ordered: 0.639, within normal limits HCG: N/A Vitamin D  25-hydroxy: Ordered: 8.6 low, replace with vitamin D  50,000 units q. 7 days x 7 doses UDS: Positive for marijuana UA: Hazy, ketones 20, protein 30, specific gravity 1.035, otherwise normal Vitamin B-12: N/A Hemoglobin A1c 5.5, within normal limits EKG: Normal sinus rhythm with sinus arrhythmia, ventricular rate 63, QT/QTc 390/399  Psychotherapy: Therapeutic milieu  Medications: See MAR  Consultations: N/A  Discharge Concerns: Safety  Estimated LOS: 5 to 7 days  Other:      Assessment: Amarii D Crumby is a 24 year old African-American male with a psychiatric history most consistent with schizophrenia, cannabis dependence, who presents to [facility] due to psychotic symptoms. He has a history of schizophrenia with chronic psychotic symptoms including disorganization in thoughts and behavior, hallucinations, delusions, and negative symptoms such as avolition and flattened affect impairing social/occupational functioning.   Initially patient was unable to engage in interview at all. He was initiated on risperidone  with some improvements, now able to answer questions linearly, although remains concrete in TP, monotonous in speech and with overly-intense eye contact. Will plan to increase risperidone  to 2 mg BID tomorrow and encourage LAI. To address underlying depressive symptoms he was started on zyprexa, which has been increased to 50 mg this  morning.    Physician Treatment Plan for Primary Diagnosis: Paranoid schizophrenia (HCC)   Plans: Medications: Resume home medications --continue Risperdal  tablets 2 mg at bedtime and 1 mg in AM p.o. for schizophrenia  -Plan to increase to 2 mg BID tomorrow  --Increase Zoloft  to 50 mg p.o. daily for depression and anxiety --Continue trazodone  tablet 50 mg p.o. daily for insomnia --Continue hydroxyzine  tablet 25 mg p.o. 3 times daily as needed for anxiety  Continue [facility] agitation protocol as recommended   Medication for other medical problems:  -- Continue vitamin D  50,000 units q. 7 days x 7 doses for vitamin D  deficiency --Initiate Protonix  40 mg p.o. daily for GERD --Initiate Colace 100 mg p.o. twice daily for constipation   Other PRN Medications -Acetaminophen  650 mg every 6 as needed/mild pain -Maalox 30 mL oral every 4 as needed/digestion -Magnesium  hydroxide 30 mL daily as needed/mild constipation   --The risks/benefits/side-effects/alternatives to this medication were discussed in detail with the patient and time was given for questions. The patient consents to medication trial.  -- Metabolic profile and EKG monitoring obtained while on an atypical antipsychotic (BMI: Lipid Panel: HbgA1c: QTc:)  -- Encouraged patient to participate in unit milieu and in scheduled group therapies   Safety and Monitoring: Voluntary admission to inpatient psychiatric unit for safety, stabilization and treatment Daily contact with patient to assess and evaluate symptoms and progress in treatment Patient's case to be discussed in multi-disciplinary team meeting Observation Level : q15 minute checks Vital signs: q12 hours Precautions: suicide, but pt currently verbally contracts for safety  on unit    Discharge Planning: Social work and case management to assist with discharge planning and identification of hospital follow-up needs prior to discharge Estimated LOS: 5-7 days Discharge Concerns:  Need to establish Safety plan; Medication compliance and effectiveness Discharge Goals: Return home with outpatient referrals for mental health follow-up including medication management/psychotherapy.   Long Term Goal(s): Improvement in symptoms so as ready for discharge   Short Term Goals: Ability to identify changes in lifestyle to reduce recurrence of condition will improve, Ability to verbalize feelings will improve, Ability to disclose and discuss suicidal ideas, Ability to demonstrate self-control will improve, Ability to identify and develop effective coping behaviors will improve, Ability to maintain clinical measurements within normal limits will improve, Compliance with prescribed medications will improve, and Ability to identify triggers associated with substance abuse/mental health issues will improve   Physician Treatment Plan for Secondary Diagnosis: Principal Problem:   Paranoid schizophrenia (HCC)   I certify that inpatient services furnished can reasonably be expected to improve the patient's condition.     Leita LOISE Arts, MD 03/15/2024, 10:08 AM Patient ID: Alfonzo JONETTA Lex, male   DOB: 2000-11-23, 24 y.o.   MRN: [id number]

## 2024-03-15 NOTE — Group Note (Signed)
 Date:  03/15/2024 Time:  3:52 PM  Group Topic/Focus: [place] Group The goal of this adult mental health group is to support participants in improving emotional awareness, developing healthy coping skills, and strengthening social connections. Through guided discussions, skill-building activities, and peer support, group members will learn practical strategies to manage stress, regulate emotions, and communicate their needs more effectively. Progress will be measured through participation, self-reported use of coping skills, and completion of personal wellness plans over the course of the group program.    Participation Level:  Did Not Attend   Jeremy Taylor 03/15/2024, 3:52 PM

## 2024-03-15 NOTE — Group Note (Signed)
 Recreation Therapy Group Note   Group Topic:Animal Assisted Therapy   Group Date: 03/15/2024 Start Time: 0950 End Time: 1030 Facilitators: Ruweyda Macknight-McCall, LRT,CTRS Location: [address] Dayroom   Animal-Assisted Activity (AAA) Program Checklist/Progress Notes Patient Eligibility Criteria Checklist & Daily Group note for Rec Tx Intervention  AAA/T Program Assumption of Risk Form signed by Patient/ or Parent Legal Guardian Yes  Patient is free of allergies or severe asthma Yes  Patient reports no fear of animals Yes  Patient reports no history of cruelty to animals Yes  Patient understands his/her participation is voluntary Yes  Patient washes hands before animal contact Yes  Patient washes hands after animal contact Yes  Behavioral Response: Engaged   Education: Charity Fundraiser, Appropriate Animal Interaction   Education Outcome: Acknowledges education.    Affect/Mood: Appropriate   Participation Level: Engaged   Participation Quality: Independent   Behavior: Appropriate   Speech/Thought Process: Focused   Insight: Good   Judgement: Good   Modes of Intervention: Teaching Laboratory Technician   Patient Response to Interventions:  Engaged   Education Outcome:  In group clarification offered    Clinical Observations/Individualized Feedback: Patient attended session and interacted appropriately with therapy dog and peers. Patient asked appropriate questions about therapy dog and his training. Patient shared stories about their pets at home with group.     Plan: Continue to engage patient in RT group sessions 2-3x/week.   Stephannie Broner-McCall, LRT,CTRS 03/15/2024 12:43 PM

## 2024-03-15 NOTE — Progress Notes (Signed)
 Patient refused evening medications stating I don't need those I am not hearing voices When encouraged to still take the medications and what they were for the patient responded with No and rolled away from nurse.

## 2024-03-15 NOTE — Group Note (Signed)
 Date:  03/15/2024 Time:  3:06 PM  Group Topic/Focus: Social work group- Speaking from the heart Speaking from the Heart is a supportive social work group that provides a safe, respectful space for individuals to share their thoughts, feelings, and lived experiences openly and honestly. The group encourages authentic communication, emotional expression, and mutual understanding, while honoring each persons voice and journey. Through empathy, active listening, and compassion, members are empowered to connect with themselves and others, build emotional awareness, and foster healing, growth, and resilience.    Participation Level:  Did Not Attend   Dolores CHRISTELLA Fredericks 03/15/2024, 3:06 PM

## 2024-03-15 NOTE — Group Note (Signed)
 LCSW Group Therapy Note   Group Date: 03/15/2024 Start Time: 1100 End Time: 1200   Participation:  did not attend  Type of Therapy:  Group Therapy  Topic:  Speaking from the Heart: Communicating with Understanding and Empathy  Objective:  To help participants develop effective communication skills to express themselves clearly, listen actively, and navigate conflicts in a healthy way.  Goals: Increase awareness of verbal and non-verbal communication skills. Practice using I statements and active listening techniques. Learn coping strategies for managing communication stress.  Summary:  Participants explored the importance of communication, discussed challenges, and practiced skills such as active listening and assertive expression. They reflected on past experiences and identified ways to improve communication in their daily lives.  Therapeutic Modalities: Cognitive-Behavioral Therapy (CBT): Restructuring negative thought patterns in communication. Mindfulness: Staying present and calm during conversations. Psychoeducation: Learning about effective communication techniques.   Tabathia Knoche O Davyn Elsasser, LCSWA 03/15/2024  12:19 PM

## 2024-03-15 NOTE — [facility] Group Notes (Signed)
 [facility] Group Notes:  (Nursing/MHT/Case Management/Adjunct)  Date:  03/15/2024  Time:  10:50 PM  Type of Therapy:  WRAP UP GROUP  Participation Level:  Active  Participation Quality:  Appropriate  Affect:  Appropriate  Cognitive:  Appropriate  Insight:  Appropriate  Engagement in Group:  Engaged  Modes of Intervention:  Education  Summary of Progress/Problems: GOAL TO BE D/C. RATED DAY 1/10.  Jeremy Taylor Essex 03/15/2024, 10:50 PM

## 2024-03-15 NOTE — Group Note (Signed)
 Date:  03/15/2024 Time:  10:26 AM  Group Topic/Focus: Pet therapy Pet therapy, also known as animal-assisted therapy, is a supportive approach used to improve mental health in adults by incorporating trained animals into therapeutic settings. Interaction with animals can help reduce stress, anxiety, and feelings of loneliness while promoting relaxation and emotional comfort. Pet therapy has been shown to improve mood, encourage social interaction, and increase engagement in treatment, especially for individuals experiencing depression, anxiety, PTSD, or chronic stress. When guided by a trained professional and used alongside traditional mental health care, pet therapy can be a safe and effective way to support emotional well-being and overall mental health.    Participation Level:  Did Not Attend   Jeremy Taylor 03/15/2024, 10:26 AM

## 2024-03-15 NOTE — [facility] Suicide Risk Assessment (Signed)
 [facility] INPATIENT:  Family/Significant Other Suicide Prevention Education  Suicide Prevention Education:  Contact Attempts: Mother, [name] [phone],  has been identified by the patient as the family member/significant other with whom the patient will be residing, and identified as the person(s) who will aid the patient in the event of a mental health crisis.  With written consent from the patient, two attempts were made to provide suicide prevention education, prior to and/or following the patient's discharge.  We were unsuccessful in providing suicide prevention education.  A suicide education pamphlet was given to the patient to share with family/significant other.  Date and time of first attempt: 03/15/2024 at 9:25 am   Jeremy Taylor 03/15/2024, 9:39 AM

## 2024-03-15 NOTE — Group Note (Signed)
 Date:  03/15/2024 Time:  10:15 AM  Group Topic/Focus: Goals and orientation Goals Group:   The focus of this group is to help patients establish daily goals to achieve during treatment and discuss how the patient can incorporate goal setting into their daily lives to aide in recovery. Orientation:   The focus of this group is to educate the patient on the purpose and policies of crisis stabilization and provide a format to answer questions about their admission.  The group details unit policies and expectations of patients while admitted.    Participation Level:  Did Not Attend   Jeremy Taylor 03/15/2024, 10:15 AM

## 2024-03-15 NOTE — Plan of Care (Signed)

## 2024-03-15 NOTE — [facility] Group Notes (Signed)
 Adult Psychoeducational Group Note  Date:  03/15/2024 Time:  7:42 PM  Group Topic/Focus:  Emotional Education:   The focus of this group is to discuss what feelings/emotions are, and how they are experienced.  Participation Level:  Active  Participation Quality:  Appropriate  Affect:  Appropriate  Cognitive:  Appropriate  Insight: Appropriate  Engagement in Group:  Engaged  Modes of Intervention:  Activity  Additional Comments:  na  Joaquin MALVA Doing 03/15/2024, 7:42 PM

## 2024-03-16 ENCOUNTER — Encounter (HOSPITAL_COMMUNITY): Payer: Self-pay

## 2024-03-16 MED ORDER — PALIPERIDONE ER 6 MG PO TB24
6.0000 mg | ORAL_TABLET | Freq: Every day | ORAL | Status: DC
  Administered 2024-03-17 – 2024-03-20 (×4): 6 mg via ORAL

## 2024-03-16 MED ADMIN — Trazodone HCl Tab 50 MG: 50 mg | ORAL | NDC 60687044311

## 2024-03-16 MED ADMIN — Risperidone Tab 2 MG: 2 mg | ORAL | NDC 27241000406

## 2024-03-16 MED FILL — Risperidone Tab 2 MG: 2.0000 mg | ORAL | Qty: 1 | Status: AC

## 2024-03-16 NOTE — Progress Notes (Signed)
(  Sleep Hours) - 5.75 (Any PRNs that were needed, meds refused, or side effects to meds)- none (Any disturbances and when (visitation, over night)- none (Concerns raised by the patient)- none (SI/HI/AVH)- denies

## 2024-03-16 NOTE — Progress Notes (Signed)
Pt did not attend pharmacy group.

## 2024-03-16 NOTE — Progress Notes (Signed)
 Pt did not attend the spiritual wellness group

## 2024-03-16 NOTE — Group Note (Signed)
 Date:  03/16/2024 Time:  5:26 PM  Group Topic/Focus: Clean Up Your Sleep Hygiene   Wellness Toolbox:   The focus of this group is to discuss various aspects of wellness, balancing those aspects and exploring ways to increase the ability to experience wellness.  Patients will create a wellness toolbox for use upon discharge.    Participation Level:  Did Not Attend    Inocente PARAS [facility] 03/16/2024, 5:26 PM

## 2024-03-16 NOTE — Group Note (Signed)
 Date:  03/16/2024 Time:  8:32 PM  Group Topic/Focus:  Wrap-Up Group:   The focus of this group is to help patients review their daily goal of treatment and discuss progress on daily workbooks.    Participation Level:  Did Not Attend  Participation Quality:  none  Affect:  n/a  Cognitive:  n/a  Insight: None  Engagement in Group:  None  Modes of Intervention:  none  Additional Comments:   Pt did not attend NA meeting  [name] A Keithen Capo 03/16/2024, 8:32 PM

## 2024-03-16 NOTE — Progress Notes (Signed)
" °   03/16/24 0800  Psych Admission Type (Psych Patients Only)  Admission Status Involuntary  Psychosocial Assessment  Patient Complaints None  Eye Contact Avertive  Facial Expression Flat  Affect Flat  Speech Soft  Interaction Guarded  Motor Activity Slow  Appearance/Hygiene In scrubs  Behavior Characteristics Cooperative;Appropriate to situation  Mood Preoccupied  Aggressive Behavior  Targets Other (Comment) (na)  Type of Behavior Other (Comment) (na)  Effect No apparent injury  Thought Process  Coherency Blocking  Content WDL  Delusions None reported or observed  Perception WDL  Hallucination None reported or observed  Judgment Limited  Confusion None  Danger to Self  Current suicidal ideation? Denies  Agreement Not to Harm Self Yes  Description of Agreement verbal  Danger to Others  Danger to Others None reported or observed    "

## 2024-03-16 NOTE — Group Note (Signed)
 Recreation Therapy Group Note   Group Topic:Communication  Group Date: 03/16/2024 Start Time: 0940 End Time: 1010 Facilitators: Hasten Sweitzer-McCall, LRT,CTRS Location: [address] Dayroom   Group Topic: Communication, Problem Solving   Goal Area(s) Addresses:  Patient will effectively listen to complete activity.  Patient will identify communication skills used to make activity successful.  Patient will identify how skills used during activity can be used to reach post d/c goals.    Behavioral Response:    Intervention: Building Surveyor Activity - Geometric pattern cards, pencils, blank paper    Activity: Geometric Drawings.  Three volunteers from the peer group will be shown an abstract picture with a particular arrangement of geometrical shapes.  Each round, one 'speaker' will describe the pattern, as accurately as possible without revealing the image to the group.  The remaining group members will listen and draw the picture to reflect how it is described to them. Patients with the role of 'listener' cannot ask clarifying questions but, may request that the speaker repeat a direction. Once the drawings are complete, the presenter will show the rest of the group the picture and compare how close each person came to drawing the picture. LRT will facilitate a post-activity discussion regarding effective communication and the importance of planning, listening, and asking for clarification in daily interactions with others.  Education: Environmental consultant, Active listening, Support systems, Discharge planning  Education Outcome: Acknowledges understanding/In group clarification offered/Needs additional education.    Affect/Mood: N/A   Participation Level: Did not attend    Clinical Observations/Individualized Feedback:     Plan: Continue to engage patient in RT group sessions 2-3x/week.   Jah Alarid-McCall, LRT,CTRS  03/16/2024 12:57 PM

## 2024-03-16 NOTE — Plan of Care (Signed)
   Problem: Education: Goal: Knowledge of [place] [facility] Education information/materials will improve Outcome: Progressing Goal: Emotional status will improve Outcome: Progressing Goal: Mental status will improve Outcome: Progressing Goal: Verbalization of understanding the information provided will improve Outcome: Progressing

## 2024-03-16 NOTE — Group Note (Signed)
 Date:  03/16/2024 Time:  10:08 AM  Group Topic/Focus:  Goals Group:   The focus of this group is to help patients establish daily goals to achieve during treatment and discuss how the patient can incorporate goal setting into their daily lives to aide in recovery.    Participation Level:  Did Not Attend  Participation Quality:  na  Affect:  na  Cognitive:  na  Insight: None  Engagement in Group:  None  Modes of Intervention:  na  Additional Comments:  pt did not attend  Nat Rummer 03/16/2024, 10:08 AM

## 2024-03-16 NOTE — BH IP Treatment Plan (Signed)
 Interdisciplinary Treatment and Diagnostic Plan Update  03/16/2024 Time of Session: 11:20 AM - UPDATE Jeremy Taylor MRN: [id number]  Principal Diagnosis: Paranoid schizophrenia (HCC)  Secondary Diagnoses: Principal Problem:   Paranoid schizophrenia (HCC)   Current Medications:  Current Facility-Administered Medications  Medication Dose Route Frequency Provider Last Rate Last Admin   acetaminophen  (TYLENOL ) tablet 650 mg  650 mg Oral Q6H PRN Mannie Jerel PARAS, NP   650 mg at 03/12/24 0917   alum & mag hydroxide-simeth (MAALOX/MYLANTA) 200-200-20 MG/5ML suspension 30 mL  30 mL Oral Q4H PRN Mannie Jerel PARAS, NP       haloperidol  (HALDOL ) tablet 5 mg  5 mg Oral TID PRN Mannie Jerel PARAS, NP       And   diphenhydrAMINE  (BENADRYL ) capsule 50 mg  50 mg Oral TID PRN Mannie Jerel PARAS, NP       haloperidol  lactate (HALDOL ) injection 10 mg  10 mg Intramuscular TID PRN Mannie Jerel PARAS, NP       And   diphenhydrAMINE  (BENADRYL ) injection 50 mg  50 mg Intramuscular TID PRN Mannie Jerel PARAS, NP       And   LORazepam  (ATIVAN ) injection 2 mg  2 mg Intramuscular TID PRN Mannie Jerel PARAS, NP       haloperidol  lactate (HALDOL ) injection 5 mg  5 mg Intramuscular TID PRN Mannie Jerel PARAS, NP       And   diphenhydrAMINE  (BENADRYL ) injection 50 mg  50 mg Intramuscular TID PRN Mannie Jerel PARAS, NP       And   LORazepam  (ATIVAN ) injection 2 mg  2 mg Intramuscular TID PRN Mannie Jerel PARAS, NP       docusate sodium  (COLACE) capsule 100 mg  100 mg Oral BID Ntuen, Tina C, FNP   100 mg at 03/16/24 0841   hydrOXYzine  (ATARAX ) tablet 25 mg  25 mg Oral TID PRN Ntuen, Tina C, FNP   25 mg at 03/13/24 2116   magnesium  hydroxide (MILK OF MAGNESIA) suspension 30 mL  30 mL Oral Daily PRN Mannie Jerel PARAS, NP   30 mL at 03/15/24 0852   nicotine  (NICODERM CQ  - dosed in mg/24 hours) patch 21 mg  21 mg Transdermal Q0600 Mannie Jerel PARAS, NP   21 mg at 03/16/24 0612   pantoprazole  (PROTONIX ) EC tablet 40 mg  40 mg Oral Daily  Ntuen, Tina C, FNP   40 mg at 03/16/24 9157   risperiDONE  (RISPERDAL ) tablet 2 mg  2 mg Oral BID Towana Leita SAILOR, MD   2 mg at 03/16/24 9158   sertraline  (ZOLOFT ) tablet 50 mg  50 mg Oral Daily Towana Leita SAILOR, MD   50 mg at 03/16/24 9158   traZODone  (DESYREL ) tablet 50 mg  50 mg Oral QHS PRN Ntuen, Tina C, FNP   50 mg at 03/15/24 2057   Vitamin D  (Ergocalciferol ) (DRISDOL ) 1.25 MG (50000 UNIT) capsule 50,000 Units  50,000 Units Oral Q7 days Ntuen, Tina C, FNP       PTA Medications: Medications Prior to Admission  Medication Sig Dispense Refill Last Dose/Taking   risperiDONE  (RISPERDAL ) 2 MG tablet Take 2 mg by mouth at bedtime. (Patient not taking: Reported on 03/09/2024)      sertraline  (ZOLOFT ) 25 MG tablet Take 25 mg by mouth daily. (Patient not taking: Reported on 03/09/2024)      traZODone  (DESYREL ) 50 MG tablet Take 50 mg by mouth at bedtime. (Patient not taking: Reported on 03/09/2024)  Patient Stressors: Medication change or noncompliance    Patient Strengths: Supportive family/friends   Treatment Modalities: Medication Management, Group therapy, Case management,  1 to 1 session with clinician, Psychoeducation, Recreational therapy.   Physician Treatment Plan for Primary Diagnosis: Paranoid schizophrenia (HCC) Long Term Goal(s): Improvement in symptoms so as ready for discharge   Short Term Goals: Ability to identify changes in lifestyle to reduce recurrence of condition will improve Ability to verbalize feelings will improve Ability to disclose and discuss suicidal ideas Ability to demonstrate self-control will improve Ability to identify and develop effective coping behaviors will improve Ability to maintain clinical measurements within normal limits will improve Compliance with prescribed medications will improve Ability to identify triggers associated with substance abuse/mental health issues will improve  Medication Management: Evaluate patient's response, side  effects, and tolerance of medication regimen.  Therapeutic Interventions: 1 to 1 sessions, Unit Group sessions and Medication administration.  Evaluation of Outcomes: Progressing  Physician Treatment Plan for Secondary Diagnosis: Principal Problem:   Paranoid schizophrenia (HCC)  Long Term Goal(s): Improvement in symptoms so as ready for discharge   Short Term Goals: Ability to identify changes in lifestyle to reduce recurrence of condition will improve Ability to verbalize feelings will improve Ability to disclose and discuss suicidal ideas Ability to demonstrate self-control will improve Ability to identify and develop effective coping behaviors will improve Ability to maintain clinical measurements within normal limits will improve Compliance with prescribed medications will improve Ability to identify triggers associated with substance abuse/mental health issues will improve     Medication Management: Evaluate patient's response, side effects, and tolerance of medication regimen.  Therapeutic Interventions: 1 to 1 sessions, Unit Group sessions and Medication administration.  Evaluation of Outcomes: Progressing   RN Treatment Plan for Primary Diagnosis: Paranoid schizophrenia (HCC) Long Term Goal(s): Knowledge of disease and therapeutic regimen to maintain health will improve  Short Term Goals: Ability to remain free from injury will improve, Ability to verbalize frustration and anger appropriately will improve, Ability to verbalize feelings will improve, and Ability to disclose and discuss suicidal ideas  Medication Management: RN will administer medications as ordered by provider, will assess and evaluate patient's response and provide education to patient for prescribed medication. RN will report any adverse and/or side effects to prescribing provider.  Therapeutic Interventions: 1 on 1 counseling sessions, Psychoeducation, Medication administration, Evaluate responses to  treatment, Monitor vital signs and CBGs as ordered, Perform/monitor CIWA, COWS, AIMS and Fall Risk screenings as ordered, Perform wound care treatments as ordered.  Evaluation of Outcomes: Progressing   LCSW Treatment Plan for Primary Diagnosis: Paranoid schizophrenia (HCC) Long Term Goal(s): Safe transition to appropriate next level of care at discharge, Engage patient in therapeutic group addressing interpersonal concerns.  Short Term Goals: Engage patient in aftercare planning with referrals and resources, Increase ability to appropriately verbalize feelings, Facilitate acceptance of mental health diagnosis and concerns, and Identify triggers associated with mental health/substance abuse issues  Therapeutic Interventions: Assess for all discharge needs, 1 to 1 time with Social worker, Explore available resources and support systems, Assess for adequacy in community support network, Educate family and significant other(s) on suicide prevention, Complete Psychosocial Assessment, Interpersonal group therapy.  Evaluation of Outcomes: Progressing   Progress in Treatment: Attending groups: attended some groups Participating in groups: Yes. Taking medication as prescribed: Yes. Toleration medication: Yes. Family/Significant other contact made: No, will contact:  Mother, [name] [phone].  First attempt:  03/15/2024 at 9:25 am  Patient understands diagnosis: Yes. Discussing  patient identified problems/goals with staff: Yes. Medical problems stabilized or resolved: Yes. Denies suicidal/homicidal ideation: Yes. Issues/concerns per patient self-inventory: No.   New problem(s) identified:  No   New Short Term/Long Term Goal(s):     medication stabilization, elimination of SI thoughts, development of comprehensive mental wellness plan.      Patient Goals:  I want to work on my mental health, medications and therapy.   Discharge Plan or Barriers:  Patient recently admitted. CSW  will continue to follow and assess for appropriate referrals and possible discharge planning.    Reason for Continuation of Hospitalization: Hallucinations Medication stabilization   Estimated Length of Stay:  4 - 6 days  Last 3 [place] Suicide Severity Risk Score: Flowsheet Row Ad[facility] (Current) [facility] from 03[facility]08/2024 in [facility]  C-SSRS RISK CATEGORY No Risk No Risk    Last PHQ 2/9 Scores:     No data to display          Scribe for Treatment Team: Ludivina Guymon O Shaheem Pichon, LCSW 03/16/2024 10:10 AM

## 2024-03-16 NOTE — Progress Notes (Signed)
 Pt did not attend rec therapy group

## 2024-03-16 NOTE — Plan of Care (Signed)
   Problem: Activity: Goal: Interest or engagement in activities will improve Outcome: Progressing   Problem: Coping: Goal: Ability to verbalize frustrations and anger appropriately will improve Outcome: Progressing
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
## 2024-03-16 NOTE — Progress Notes (Signed)
 Pt did not attend the social wellness group

## 2024-03-17 MED ADMIN — Trazodone HCl Tab 50 MG: 50 mg | ORAL | NDC 60687044311

## 2024-03-18 MED ADMIN — Trazodone HCl Tab 50 MG: 50 mg | ORAL | NDC 60687044311

## 2024-03-18 MED ADMIN — Paliperidone Palmitate ER Susp Pref Syr 234 MG/1.5ML: 234 mg | INTRAMUSCULAR | NDC 50458056401

## 2024-03-19 MED ADMIN — Trazodone HCl Tab 100 MG: 100 mg | ORAL | NDC 00904686961

## 2024-03-20 MED ADMIN — Trazodone HCl Tab 100 MG: 100 mg | ORAL | NDC 60687045411

## 2024-03-20 MED ADMIN — Ibuprofen Tab 800 MG: 800 mg | ORAL | NDC 00904585561

## 2024-03-20 MED ADMIN — Cyclobenzaprine HCl Tab 5 MG: 7.5 mg | ORAL | NDC 50268019011

## 2024-03-21 ENCOUNTER — Encounter (HOSPITAL_COMMUNITY): Payer: Self-pay

## 2024-03-21 MED ORDER — SERTRALINE HCL 50 MG PO TABS: 50.0000 mg | ORAL_TABLET | Freq: Every day | ORAL | 0 refills | Status: DC

## 2024-03-21 MED ORDER — VITAMIN D (ERGOCALCIFEROL) 1.25 MG (50000 UNIT) PO CAPS: 50000.0000 [IU] | ORAL_CAPSULE | ORAL | 0 refills | Status: DC

## 2024-03-21 MED ORDER — PALIPERIDONE PALMITATE ER 156 MG/ML IM SUSY: 156.0000 mg | PREFILLED_SYRINGE | INTRAMUSCULAR | 0 refills | Status: DC

## 2024-03-21 MED ORDER — NICOTINE 21 MG/24HR TD PT24: 21.0000 mg | MEDICATED_PATCH | Freq: Every day | TRANSDERMAL | 0 refills | Status: AC

## 2024-03-21 MED ORDER — PANTOPRAZOLE SODIUM 40 MG PO TBEC: 40.0000 mg | DELAYED_RELEASE_TABLET | Freq: Every day | ORAL | 0 refills | Status: AC

## 2024-03-21 MED ORDER — TRAZODONE HCL 100 MG PO TABS: 100.0000 mg | ORAL_TABLET | Freq: Every day | ORAL | 0 refills | Status: AC

## 2024-03-21 MED ORDER — NICOTINE 21 MG/24HR TD PT24: 21.0000 mg | MEDICATED_PATCH | Freq: Every day | TRANSDERMAL | 0 refills | Status: DC

## 2024-03-21 MED ORDER — PANTOPRAZOLE SODIUM 40 MG PO TBEC: 40.0000 mg | DELAYED_RELEASE_TABLET | Freq: Every day | ORAL | 0 refills | Status: DC

## 2024-03-21 MED ORDER — VITAMIN D (ERGOCALCIFEROL) 1.25 MG (50000 UNIT) PO CAPS: 50000.0000 [IU] | ORAL_CAPSULE | ORAL | 0 refills | Status: AC

## 2024-03-21 MED ORDER — PALIPERIDONE PALMITATE ER 156 MG/ML IM SUSY: 156.0000 mg | PREFILLED_SYRINGE | INTRAMUSCULAR | 0 refills | Status: AC

## 2024-03-21 MED ORDER — TRAZODONE HCL 100 MG PO TABS: 100.0000 mg | ORAL_TABLET | Freq: Every day | ORAL | 0 refills | Status: DC

## 2024-03-21 MED ORDER — SERTRALINE HCL 50 MG PO TABS: 50.0000 mg | ORAL_TABLET | Freq: Every day | ORAL | 0 refills | Status: AC

## 2024-03-21 MED ADMIN — Paliperidone Palmitate ER Susp Pref Syr 156 MG/ML: 156 mg | INTRAMUSCULAR | NDC 50458056301

## 2024-03-21 NOTE — Discharge Instructions (Signed)
 All medications except for your injection have been sent to the [facility] on [address]  You were started on invega  sustenna, a long-acting injectable medication, during this admission. You no longer require oral invega  after receiving 2 loading doses in the hospital. Next dose of invega  sustenna is due on February 13th. It has been sent to [facility], which [facility] uses.  Please ensure you make your intake appointment with [facility] TODAY at 2:30 so they can schedule your next injection appointment.

## 2024-03-21 NOTE — Progress Notes (Addendum)
" °  [facility] Adult Case Management Discharge Plan :  Will you be returning to the same living situation after discharge:  Yes,  patient will be returning home with brother.  At discharge, do you have transportation home?: Yes,  patient's brother and sister-in-law,Michael Cahall and Jenean Remington, will be providing transportation at 11am. Do you have the ability to pay for your medications: Yes,  patient has active health insurance.  Release of information consent forms completed and in the chart;  Patient's signature needed at discharge.  Patient to Follow up at:  Follow-up Information     [facility] Follow up on 03/21/2024.   Why: You have a hospital follow up appointment for therapy and medication management services on 03/21/24 at 2:30 pm.  This will be a Virtual, telehealth appointment. Contact information: [address]  [address] [place] [address] [phone]                 Next level of care provider has access to [facility] Link:no  Safety Planning and Suicide Prevention discussed: Yes,  completed with [name] (mother) [phone] and Jenean Remington (sister-in-law) [phone].      Has patient been referred to the Quitline?: Patient does not use tobacco/nicotine  products  Patient has been referred for addiction treatment: Patient refused referral for treatment.  Louetta Lame, LCSWA 03/21/2024, 9:01 AM "

## 2024-03-21 NOTE — Plan of Care (Signed)
   Problem: Education: Goal: Knowledge of [facility] Education information/materials will improve Outcome: Progressing Goal: Emotional status will improve Outcome: Progressing Goal: Mental status will improve Outcome: Progressing Goal: Verbalization of understanding the information provided will improve Outcome: Progressing   Problem: Activity: Goal: Interest or engagement in activities will improve Outcome: Progressing

## 2024-03-21 NOTE — Plan of Care (Signed)
  Problem: Coping: Goal: Ability to demonstrate self-control will improve Outcome: Progressing   Problem: Coping: Goal: Ability to demonstrate self-control will improve Outcome: Progressing

## 2024-03-21 NOTE — Discharge Summary (Signed)
 " Physician Discharge Summary Note  Patient:  Jeremy Taylor is an 24 y.o., male MRN:  [id number] DOB:  12-26-2000 Patient phone:  [phone] (home)  Patient address:   [address] [place] [address],  Total Time spent with patient: 35 minutes  Date of Admission:  03/09/2024 Date of Discharge: 03/21/24  Reason for Admission:  psychosis  Principal Problem: Paranoid schizophrenia [facility]) Discharge Diagnoses: Principal Problem:   Paranoid schizophrenia (HCC)   Identifying Information and Past Psychiatric History:  The patient is a 24 y.o. male with a psychiatric history most consistent with paranoid schizophrenia who was admitted under IVC for psychotic symptoms (largely negative, poor self care) in the setting of medication non-adherence   Psychiatric history is notable for prior admissions for psychotic symptoms with prior diagnoses of both SAD and schizophrenia, although no clear evidence of significant affective component on chart review. Symptoms have included hallucinations, delusions, disorganization in thoughts and behavior and negative symptoms including poor attention to self-care. He has previously been domiciled in a group home for higher level of care. Prior medications include abilfiy, risperidone , and olanzapine . Most recent admission per alternate chart was in August 2025. No clear evidence of prior suicide attemtps or self-harming behavior.   Hospital course: Patient initially presented to the ED at [facility]  with concerns including worsening memory loss, hallucinations in the context of cannabis dependence.  BAL less than 15, UDS positive for marijuana, additional labs unremarkable. Patient was admitted under IVC due to concerns for psychotic symtpoms in the setting of medication non-adherence and cannabis use.  Upon arrival noted to be overtly psychotic, largely mute and with severe thought blocking and incoherent speech. He was initiated on risperidone   increased to a total of 2 mg BID on this admission with eye on LAI and slow but steady improvements in ability to engage. However, in part due to family concern that risperidone  had not been fully effective in the past he was transitioned to PO invega  6 mg at bedtime. To address poor outpatient adherence and LAI was recommended and patient was agreeable. Received first loading dose of invega  sustenna 234 mg on 03/18/24 and second loading dose of 156 mg IM today (1/12). Patient's maintenance dose to be 156 mg IM, and next due 04/22/24  Interview today: Today the patient reports he is pretty good. States his back doesn't hurt as much today and he overall feels better. States he had good sleep last night and has not had any side effects to his medications. Patient was reminded about [facility] appointment today and was agreeable, happy to hear that he will no longer have to take oral risperidone  or invega . Denying any current or recent hallucinations and denying any other concerns including SI, HI and AVH. Looking forward to going home today. No other concerns voiced.   Behavior on unit/overall: Upon admission patient presented with significant negative symptoms of psychosis - latent speech to the point of being almost non-verbal and concern for ongoing response to internal stimuli. After starting risperidone  he was progressively better able to engage in interview. At first with very few short one-word responses and by the end of stay speaking appropriately in full sentances. Intense gaze through most of stay had improved by the last 2-3 days of admisison as well. Due to concerns per family regarding benefit of risperidone  in the past, he was transitioned to PO invega  and then subsequently loaded on invega  sustenna as noted above. Overall demonstrated significant improvements in psychotic  symptoms. Some concrete thought process and restriction in affect remains, but otherwise was not expressing delusions, RTIS  or otherwise demonstrating overt signs of psychosis. Deemed at baseline and stable for discharge. During this admission the patient was cooperative with care, took medications, and began to attend groups in later half of stay. Benefited from full therapeutic milieu. He denied SI throughout and continued to deny on day of discharge. Discharged home to family in stable and improved condition with PO medications sent to preferred pharmacy and LAI sent to [place] [facility], [facility]). Patient was notified that virual appointment was scheduled for today and to attend this to ensure his next injection appointment was scheduled. Voiced understanding. Also recommended calling 911, [phone] or presenting to the nearest ED if in crisis in the future. All questions answered.     Past Medical History: History reviewed. No pertinent past medical history. History reviewed. No pertinent surgical history. Family History: History reviewed. No pertinent family history.  Social History:  Social History   Substance and Sexual Activity  Alcohol Use None     Social History   Substance and Sexual Activity  Drug Use Not on file    Social History   Socioeconomic History   Marital status: Single    Spouse name: Not on file   Number of children: Not on file   Years of education: Not on file   Highest education level: Not on file  Occupational History   Not on file  Tobacco Use   Smoking status: Never   Smokeless tobacco: Never  Substance and Sexual Activity   Alcohol use: Not on file   Drug use: Not on file   Sexual activity: Not on file  Other Topics Concern   Not on file  Social History Narrative   Not on file   Social Drivers of Health   Tobacco Use: Low Risk (03/09/2024)   Patient History    Smoking Tobacco Use: Never    Smokeless Tobacco Use: Never    Passive Exposure: Not on file  Financial Resource Strain: Not on file  Food Insecurity: Patient Declined (03/09/2024)   Epic    Worried About  Programme Researcher, Broadcasting/film/video in the Last Year: Patient declined    Barista in the Last Year: Patient declined  Transportation Needs: Patient Declined (03/09/2024)   Epic    Lack of Transportation (Medical): Patient declined    Lack of Transportation (Non-Medical): Patient declined  Physical Activity: Not on file  Stress: Not on file  Social Connections: Not on file  Depression (PHQ2-9): Not on file  Alcohol Screen: Low Risk (03/09/2024)   Alcohol Screen    Last Alcohol Screening Score (AUDIT): 0  Housing: Unknown (03/09/2024)   Epic    Unable to Pay for Housing in the Last Year: Patient declined    Number of Times Moved in the Last Year: 0    Homeless in the Last Year: Patient declined  Utilities: Not At Risk (03/09/2024)   Epic    Threatened with loss of utilities: No  Health Literacy: Not on file     Physical Findings:  Mental Status exam: Appearance: black male, tall, appropriately groomed in casual clothing, seen walking calmly through the milieu Eye contact: good - much less intense; overall improved  Attitude towards examiner: cooperative and engaged Psychomotor: no agitation or retardation Speech: remains monotonous but speaking in full sentances with appropriate volume and amount  Language: no delay  Mood: good Affect: congruent, neutral, remains  restricted  Thought content: denying SI and HI, no overt delusions expressed today  Thought Process: concrete but does answer questions linearly and logically Perception: denying AVH, not overtly RTIS Insight: fair to good - able to voice diagnosis, plan for LAI, etc.   Judgement: fair    Orientation: x3 Attention/Concentration: fair  Memory/Cognition: not formally assessed, grossly intact   Fund of Knowledge: Average    Musculoskeletal: Strength & Muscle Tone: within normal limits Gait & Station: normal Patient leans: N/A   Physical Exam Constitutional:      Appearance: Normal appearance.  HENT:     Head:  Normocephalic and atraumatic.  Abdominal:     General: There is no distension.  Musculoskeletal:        General: Normal range of motion.     Cervical back: Normal range of motion.  Neurological:     General: No focal deficit present.     Mental Status: He is alert.    Review of Systems  Musculoskeletal:  Positive for back pain.  All other systems reviewed and are negative.  Blood pressure 130/89, pulse 84, temperature 98.1 F (36.7 C), temperature source Oral, resp. rate 18, height 5' 9 (1.753 m), weight 62.6 kg, SpO2 100%. Body mass index is 20.38 kg/m.   Tobacco Use History[1] Tobacco Cessation:  A prescription for an FDA-approved tobacco cessation medication provided at discharge   Blood Alcohol level:  Lab Results  Component Value Date   [facility] <15 03/08/2024    Metabolic Disorder Labs:  Lab Results  Component Value Date   HGBA1C 5.5 03/10/2024   MPG 111.15 03/10/2024   No results found for: PROLACTIN Lab Results  Component Value Date   CHOL 227 (H) 03/10/2024   TRIG 57 03/10/2024   HDL 34 (L) 03/10/2024   CHOLHDL 6.8 03/10/2024   VLDL 11 03/10/2024   LDLCALC 182 (H) 03/10/2024    See Psychiatric Specialty Exam and Suicide Risk Assessment completed by Attending Physician prior to discharge.  Discharge destination:  Home  Is patient on multiple antipsychotic therapies at discharge:  No    Allergies as of 03/21/2024   Not on File      Medication List     STOP taking these medications    risperiDONE  2 MG tablet Commonly known as: RISPERDAL        TAKE these medications      Indication  nicotine  21 mg/24hr patch Commonly known as: NICODERM CQ  - dosed in mg/24 hours Place 1 patch (21 mg total) onto the skin daily at 6 (six) AM for 28 days.  Indication: Nicotine  Addiction   paliperidone  156 MG/ML Susy injection Commonly known as: INVEGA  SUSTENNA Inject 1 mL (156 mg total) into the muscle every 28 (twenty-eight) days. Start taking on:  April 23, 2027  Indication: Schizophrenia, Mood control   pantoprazole  40 MG tablet Commonly known as: PROTONIX  Take 1 tablet (40 mg total) by mouth daily.  Indication: Gastroesophageal Reflux Disease   sertraline  50 MG tablet Commonly known as: ZOLOFT  Take 1 tablet (50 mg total) by mouth daily. What changed:  medication strength how much to take  Indication: Major Depressive Disorder   traZODone  100 MG tablet Commonly known as: DESYREL  Take 1 tablet (100 mg total) by mouth at bedtime. What changed:  medication strength how much to take  Indication: Trouble Sleeping   Vitamin D  (Ergocalciferol ) 1.25 MG (50000 UNIT) Caps capsule Commonly known as: DRISDOL  Take 1 capsule (50,000 Units total) by mouth every 7 (seven) days  for 6 doses. Take on Fridays Start taking on: March 25, 2024  Indication: Vitamin D  Deficiency        Follow-up Information     [facility] Follow up on 03/21/2024.   Why: You have a hospital follow up appointment for therapy and medication management services on 03/21/24 at 2:30 pm.  This will be a Virtual, telehealth appointment. Contact information: [address]  [address] [place] [address] [phone]                 Signed: Leita LOISE Arts, MD 03/21/2024, 8:10 AM      [1]  Social History Tobacco Use  Smoking Status Never  Smokeless Tobacco Never   "

## 2024-03-21 NOTE — Progress Notes (Signed)
 Pt discharged to lobby. Pt was stable and appreciative at that time. All papers and prescriptions were given and valuables returned. Verbal understanding expressed. Denies SI/HI and A/VH. Pt given opportunity to express concerns and ask questions.

## 2024-03-21 NOTE — [facility] Suicide Risk Assessment (Signed)
 [facility] Discharge Suicide Risk Assessment   Principal Problem: Paranoid schizophrenia [facility]) Discharge Diagnoses: Principal Problem:   Paranoid schizophrenia (HCC)  Suicide risk: The patient presented with acute risk factor for suicide including AMS/psychosis for which he was admitted to [facility] and started on medications with good benefit. He carries additional chronic risk factors of history of TBI with possibility of cognitive deciits, history of cannabis use, male gender, history of mental illness and prior psychiatric admissions. However protective factors include no voiced SI during this admission,  These are mitigated by denial of SI throughout admission, no known history of suicide attempts or self-harming behavior, domiciled with family support. Today is euthymic, voicing good mood and clear future orientation, continuing to deny SI. He has follow up scheduled for this afternoon to establish care and continue on LAI. At this time current and short-term risk of suicide is considered low   Follow-up Information     [facility] Follow up on 03/21/2024.   Why: You have a hospital follow up appointment for therapy and medication management services on 03/21/24 at 2:30 pm.  This will be a Virtual, telehealth appointment. Contact information: [address]  [address] [place] [address] [phone]                 Leita LOISE Arts, MD 03/21/2024, 7:58 AM

## 2024-03-21 NOTE — Progress Notes (Signed)
 Patient denies SI, HI, AVH. Patient stated they slept Good last night. Scored zero on anxiety, feeling of hopelessness, and depression. Patient goal is to Live a good life and states will Take my meds to help reach that goal. Patient has been calm, cooperative, and med compliant.     03/21/24 0900  Psych Admission Type (Psych Patients Only)  Admission Status Voluntary  Psychosocial Assessment  Patient Complaints None  Eye Contact Fair  Facial Expression Flat  Affect Flat  Speech Logical/coherent  Interaction Minimal  Motor Activity Slow  Appearance/Hygiene Unremarkable  Behavior Characteristics Cooperative  Mood Pleasant  Thought Process  Coherency WDL  Content WDL  Delusions None reported or observed  Perception WDL  Hallucination None reported or observed  Judgment Limited  Confusion None  Danger to Self  Current suicidal ideation? Denies  Agreement Not to Harm Self Yes  Description of Agreement Verbal  Danger to Others  Danger to Others None reported or observed
